# Patient Record
Sex: Female | Born: 1965
Health system: Southern US, Community
[De-identification: ages and names within clinical notes are randomized; demographics above are authoritative.]

## PROBLEM LIST (undated history)

## (undated) DIAGNOSIS — C221 Intrahepatic bile duct carcinoma: Secondary | ICD-10-CM

## (undated) DIAGNOSIS — I313 Pericardial effusion (noninflammatory): Secondary | ICD-10-CM

## (undated) DIAGNOSIS — I3139 Other pericardial effusion (noninflammatory): Secondary | ICD-10-CM

## (undated) DIAGNOSIS — D259 Leiomyoma of uterus, unspecified: Secondary | ICD-10-CM

## (undated) DIAGNOSIS — C799 Secondary malignant neoplasm of unspecified site: Secondary | ICD-10-CM

## (undated) DIAGNOSIS — C801 Malignant (primary) neoplasm, unspecified: Secondary | ICD-10-CM

## (undated) DIAGNOSIS — R000 Tachycardia, unspecified: Secondary | ICD-10-CM

## (undated) HISTORY — DX: Intrahepatic bile duct carcinoma: C22.1

## (undated) HISTORY — DX: Other pericardial effusion (noninflammatory): I31.39

## (undated) HISTORY — DX: Tachycardia, unspecified: R00.0

## (undated) HISTORY — DX: Leiomyoma of uterus, unspecified: D25.9

## (undated) HISTORY — DX: Secondary malignant neoplasm of unspecified site: C79.9

## (undated) HISTORY — DX: Pericardial effusion (noninflammatory): I31.3

## (undated) HISTORY — DX: Malignant (primary) neoplasm, unspecified: C80.1

---

## 2000-06-13 ENCOUNTER — Other Ambulatory Visit: Admission: RE | Admit: 2000-06-13 | Discharge: 2000-06-13 | Payer: Self-pay | Admitting: Obstetrics and Gynecology

## 2001-06-18 ENCOUNTER — Other Ambulatory Visit: Admission: RE | Admit: 2001-06-18 | Discharge: 2001-06-18 | Payer: Self-pay | Admitting: Obstetrics and Gynecology

## 2002-09-29 ENCOUNTER — Other Ambulatory Visit: Admission: RE | Admit: 2002-09-29 | Discharge: 2002-09-29 | Payer: Self-pay | Admitting: Obstetrics and Gynecology

## 2003-12-21 ENCOUNTER — Other Ambulatory Visit: Admission: RE | Admit: 2003-12-21 | Discharge: 2003-12-21 | Payer: Self-pay | Admitting: Obstetrics and Gynecology

## 2005-03-21 ENCOUNTER — Ambulatory Visit (HOSPITAL_COMMUNITY): Admission: RE | Admit: 2005-03-21 | Discharge: 2005-03-21 | Payer: Self-pay | Admitting: *Deleted

## 2005-09-17 ENCOUNTER — Other Ambulatory Visit: Admission: RE | Admit: 2005-09-17 | Discharge: 2005-09-17 | Payer: Self-pay | Admitting: Obstetrics & Gynecology

## 2006-05-10 ENCOUNTER — Inpatient Hospital Stay (HOSPITAL_COMMUNITY): Admission: AD | Admit: 2006-05-10 | Discharge: 2006-05-10 | Payer: Self-pay | Admitting: *Deleted

## 2006-06-13 ENCOUNTER — Inpatient Hospital Stay (HOSPITAL_COMMUNITY): Admission: AD | Admit: 2006-06-13 | Discharge: 2006-06-13 | Payer: Self-pay | Admitting: *Deleted

## 2006-07-04 ENCOUNTER — Inpatient Hospital Stay (HOSPITAL_COMMUNITY): Admission: AD | Admit: 2006-07-04 | Discharge: 2006-07-04 | Payer: Self-pay | Admitting: Obstetrics and Gynecology

## 2006-07-29 ENCOUNTER — Ambulatory Visit (HOSPITAL_COMMUNITY): Admission: RE | Admit: 2006-07-29 | Discharge: 2006-07-29 | Payer: Self-pay | Admitting: Obstetrics and Gynecology

## 2006-12-26 ENCOUNTER — Inpatient Hospital Stay (HOSPITAL_COMMUNITY): Admission: AD | Admit: 2006-12-26 | Discharge: 2006-12-26 | Payer: Self-pay | Admitting: Obstetrics & Gynecology

## 2007-01-06 ENCOUNTER — Inpatient Hospital Stay (HOSPITAL_COMMUNITY): Admission: AD | Admit: 2007-01-06 | Discharge: 2007-01-11 | Payer: Self-pay | Admitting: Obstetrics and Gynecology

## 2008-03-11 ENCOUNTER — Ambulatory Visit (HOSPITAL_COMMUNITY): Admission: RE | Admit: 2008-03-11 | Discharge: 2008-03-11 | Payer: Self-pay | Admitting: Obstetrics and Gynecology

## 2009-03-31 ENCOUNTER — Ambulatory Visit (HOSPITAL_COMMUNITY): Admission: RE | Admit: 2009-03-31 | Discharge: 2009-03-31 | Payer: Self-pay | Admitting: Obstetrics and Gynecology

## 2010-05-03 ENCOUNTER — Ambulatory Visit (HOSPITAL_COMMUNITY): Admission: RE | Admit: 2010-05-03 | Discharge: 2010-05-03 | Payer: Self-pay | Admitting: Obstetrics and Gynecology

## 2010-09-09 ENCOUNTER — Encounter: Payer: Self-pay | Admitting: Obstetrics and Gynecology

## 2011-01-01 NOTE — Op Note (Signed)
NAME:  Melissa Hurley, Melissa Hurley               ACCOUNT NO.:  [id number]   MEDICAL RECORD NO.:  [id number]          PATIENT TYPE:  INP   LOCATION:  9109                          FACILITY:  [facility]   PHYSICIAN:  Maxie Better, M.D.DATE OF BIRTH:  [birth year]   DATE OF PROCEDURE:  01/08/2007  DATE OF DISCHARGE:                               OPERATIVE REPORT   PREOPERATIVE DIAGNOSIS:  Arrest of dilatation, fibroid uterus, post  dates.   PROCEDURE:  The primary cesarean section Kerr hysterotomy.   POSTOPERATIVE DIAGNOSIS:  Arrest of dilatation and fibroid uterus, post  dates.   ANESTHESIA:  Epidural.   SURGEON:  Maxie Better, M.D.   ASSISTANT:  Naima A. Normand Sloop, M.D.   INDICATIONS:  This is a 45 year old G2 P0, married black female at 40+  weeks gestation admitted on 01/08/2007 for induction of labor secondary  to postdates. Pregnancy been complicated by large fibroid uterus. The  patient underwent Cervidil placement x2. She subsequently had  spontaneous rupture of membranes. Internal scalp electrode internal  pressure catheter was subsequently placed. The cervix at that time was  3, 40% -3. Pitocin augmentation was started.  The patient progressed to  4 cm and arrested at that dilatation and edematous cervix despite 200-  220 MV units documented by the pressure catheter. Given the cervical  exam, recommendation was made for a primary cesarean section.  During  course of her pregnancy several ultrasound suggested pedunculated  fibroids of up to 10 cm and a separate consent was therefore obtained in  the event that the fibroids were amenable for removal at a time of this  procedure. Risk of both cesarean section and removal of fibroids was  reviewed with the patient and husband.  Consent had been signed for the  labor and a separate consent was signed for the possible myomectomy.  The patient was then transferred to the operating room.   PROCEDURE:  Under adequate epidural anesthesia  the patient is placed in  the supine position with a left lateral tilt.  When adequate level was  obtained the patient was sterilely prepped and draped in usual fashion.  Indwelling Foley catheter was already in place. 10 mL of quarter percent  Marcaine was injected along the planned Pfannenstiel skin incision. A  Pfannenstiel incision was then made, carried down to the rectus fascia.  Rectus fascia was opened transversely.  Rectus fascia was then sharply  and bluntly dissected off the rectus muscles in superior and inferior  fashion.  The rectus muscle was split in midline.  The parietal  peritoneum was entered bluntly and extended. The vesicouterine  peritoneum was opened transversely.  Lower uterine segment incision was  then made and extended with bandage scissors.  Subsequent delivery of a  live female from the right occiput transverse/right occiput posterior  presentation was accomplished.  There was a compound presentation with  the baby holding the cord in the right hand. Subsequent delivery was  accomplished.  The cord was wrapped around the shoulders all of which  was released.  Cord was clamped, cut the baby was transferred to the  awaiting pediatrician  who assigned Apgars of 8 and 9 at 1 and 5.  The  placenta which was posterior was manually removed.  There was a 5 cm  intracavitary/submucosal fibroid in the left fundal region which was not  removed.  The fibroids on palpation were subserosal up to 8 cm and with  a wide base stalk, not pedunculated and therefore not removable.  The  left fallopian tubes and ovary were noted.  The right fallopian tube was  admixed with some adhesions and fibroids and therefore was not seen or  palpated due to the fibroids. Uterine incision had a small extension  inferiorly which was closed with separately with 0 Monocryl running  locked stitch.  The remaining incision was closed with 0 Monocryl  running locked stitch first layer, a second layer  was imbricated using 0  Monocryl suture.  Good hemostasis was noted.  The abdomen was then  copiously irrigated, suctioned debris.  The parietal peritoneum was not  closed.  The rectus fascia was closed with 0 Vicryl x2.  The  subcutaneous areas irrigated, small bleeders cauterized.  Interrupted 2-  0 plain sutures were then placed.  The skin approximated Ethicon  staples.   SPECIMENS:  Placenta not sent to pathology.  Estimated blood loss was  1000 mL.  Intraoperative fluid was 2500 mL crystalloid.  Urine output  was 200 mL clear yellow urine.  Sponge and instrument counts x2 was  correct.  Complications none.  The patient tolerated the procedure well  was transferred to recovery in stable condition.      Maxie Better, M.D.  Electronically Signed     [place]/MEDQ  D:  01/08/2007  T:  01/08/2007  Job:  [id number]

## 2011-01-04 NOTE — Discharge Summary (Signed)
NAME:  Melissa Hurley, Melissa Hurley               ACCOUNT NO.:  [id number]   MEDICAL RECORD NO.:  [id number]          PATIENT TYPE:  INP   LOCATION:  9109                          FACILITY:  [facility]   PHYSICIAN:  Maxie Better, M.D.DATE OF BIRTH:  02/26/1966   DATE OF ADMISSION:  01/06/2007  DATE OF DISCHARGE:  01/11/2007                               DISCHARGE SUMMARY   ADMISSION DIAGNOSES:  1. Post dates.  2. Fibroid uterus.  3. Gestational hypertension.   DISCHARGE DIAGNOSES:  1. Post dates, delivered.  2. Arrest of dilatation.  3. Fibroid uterus.  4. Iron-deficiency anemia.   PROCEDURE:  Primary cesarean section, Kerr hysterotomy.   HISTORY OF PRESENT ILLNESS:  A 45 year old, gravida 2, para 0-0-1-0  female at 40-4/7 weeks admitted for induction of labor secondary to  gestational hypertension post dates and fibroid uterus.   HOSPITAL COURSE:  The patient was admitted to [facility].  Blood  pressure was 142/94.  She was long, closed and high.  She had multiple  known large fibroids.  The patient had Cervidil placement.  PIH labs  were normal.  She had spontaneous rupture of membranes on May 21.  At  that time, she was 3, 40%, -3.  Internal scalp electrode and internal  pressure catheter were placed.  Tracing was reassuring.  Low-dose  Pitocin augmentation was subsequently started.  The patient progressed  to  4 cm, edematous cervix,  -3 station.  Despite adequate contractions  for greater than 2 hours, decision was then made to proceed with a  primary cesarean section.  The patient had a live female, normal tubes  and ovaries, and placenta was manually removed intact.  She had multiple  fibroids and the intracavitary submucosal fibroid as well.  Several  subserosal fibroids were also noted.  Please see the dictated operative  note for specific details.  The patient had estimated blood loss of 1  liter.  She had an uncomplicated postoperative course.  Her CBC on  postoperative day  #1 showed a hemoglobin of 8.5, hematocrit 26.0, white  count of 10.7, platelet count 203,000.  Her admission hemoglobin had  been 10.1.  By postoperative day #3, the patient was tolerating a  regular diet.  Incision showed no evidence of an infection.  Her blood  pressures remained stable throughout her postoperative course, and she  was deemed well to be discharged home.   DISPOSITION:  home.   CONDITION:  Stable.   DISCHARGE MEDICATIONS:  1. Tylox 1 to 2 tablets every 3 to 4 hours p.r.n. pain.  2. Ibuprofen 600 mg every 6 hours p.r.n. pain.  3. Prenatal vitamins 1 p.o. daily.  4. Continue iron supplementation twice a day.   FOLLOW-UP APPOINTMENT:  At [facility] in 6 weeks.   DISCHARGE INSTRUCTIONS:  Per the postpartum booklet given.      Maxie Better, M.D.  Electronically Signed     [place]/MEDQ  D:  02/11/2007  T:  02/11/2007  Job:  [id number]

## 2011-06-10 ENCOUNTER — Other Ambulatory Visit (HOSPITAL_COMMUNITY): Payer: Self-pay | Admitting: Obstetrics and Gynecology

## 2011-06-10 DIAGNOSIS — Z1231 Encounter for screening mammogram for malignant neoplasm of breast: Secondary | ICD-10-CM

## 2011-07-05 ENCOUNTER — Ambulatory Visit (HOSPITAL_COMMUNITY)
Admission: RE | Admit: 2011-07-05 | Discharge: 2011-07-05 | Disposition: A | Discharge status: Home / Self Care | Payer: BC Managed Care – PPO | Source: Ambulatory Visit | Attending: Obstetrics and Gynecology | Admitting: Obstetrics and Gynecology

## 2011-07-05 DIAGNOSIS — Z1231 Encounter for screening mammogram for malignant neoplasm of breast: Secondary | ICD-10-CM | POA: Insufficient documentation

## 2012-06-05 ENCOUNTER — Other Ambulatory Visit (HOSPITAL_COMMUNITY): Payer: Self-pay | Admitting: Obstetrics and Gynecology

## 2012-06-05 DIAGNOSIS — Z1231 Encounter for screening mammogram for malignant neoplasm of breast: Secondary | ICD-10-CM

## 2012-07-06 ENCOUNTER — Ambulatory Visit (HOSPITAL_COMMUNITY)
Admission: RE | Admit: 2012-07-06 | Discharge: 2012-07-06 | Disposition: A | Discharge status: Home / Self Care | Payer: BC Managed Care – PPO | Source: Ambulatory Visit | Attending: Obstetrics and Gynecology | Admitting: Obstetrics and Gynecology

## 2012-07-06 DIAGNOSIS — Z1231 Encounter for screening mammogram for malignant neoplasm of breast: Secondary | ICD-10-CM | POA: Insufficient documentation

## 2013-09-13 ENCOUNTER — Other Ambulatory Visit (HOSPITAL_COMMUNITY): Payer: Self-pay | Admitting: Obstetrics and Gynecology

## 2013-09-13 DIAGNOSIS — Z1231 Encounter for screening mammogram for malignant neoplasm of breast: Secondary | ICD-10-CM

## 2013-09-28 ENCOUNTER — Ambulatory Visit (HOSPITAL_COMMUNITY)
Admission: RE | Admit: 2013-09-28 | Discharge: 2013-09-28 | Disposition: A | Discharge status: Home / Self Care | Payer: BC Managed Care – PPO | Source: Ambulatory Visit | Attending: Obstetrics and Gynecology | Admitting: Obstetrics and Gynecology

## 2013-09-28 DIAGNOSIS — Z1231 Encounter for screening mammogram for malignant neoplasm of breast: Secondary | ICD-10-CM | POA: Insufficient documentation

## 2014-03-14 ENCOUNTER — Encounter: Payer: Self-pay | Admitting: Nurse Practitioner

## 2014-11-08 ENCOUNTER — Other Ambulatory Visit (HOSPITAL_COMMUNITY): Payer: Self-pay | Admitting: Obstetrics and Gynecology

## 2014-11-08 DIAGNOSIS — Z1231 Encounter for screening mammogram for malignant neoplasm of breast: Secondary | ICD-10-CM

## 2014-11-09 ENCOUNTER — Ambulatory Visit (HOSPITAL_COMMUNITY)
Admission: RE | Admit: 2014-11-09 | Discharge: 2014-11-09 | Disposition: A | Discharge status: Home / Self Care | Payer: BC Managed Care – PPO | Source: Ambulatory Visit | Attending: Obstetrics and Gynecology | Admitting: Obstetrics and Gynecology

## 2014-11-09 DIAGNOSIS — Z1231 Encounter for screening mammogram for malignant neoplasm of breast: Secondary | ICD-10-CM | POA: Diagnosis not present

## 2019-07-07 ENCOUNTER — Encounter: Payer: Self-pay | Admitting: Physician Assistant

## 2019-07-23 ENCOUNTER — Other Ambulatory Visit: Payer: Self-pay

## 2019-07-23 ENCOUNTER — Ambulatory Visit: Payer: BC Managed Care – PPO | Admitting: Physician Assistant

## 2019-07-23 ENCOUNTER — Encounter: Payer: Self-pay | Admitting: Physician Assistant

## 2019-07-23 ENCOUNTER — Other Ambulatory Visit (INDEPENDENT_AMBULATORY_CARE_PROVIDER_SITE_OTHER): Payer: BC Managed Care – PPO

## 2019-07-23 VITALS — BP 138/88 | HR 120 | Temp 97.8°F | Ht 66.0 in | Wt 231.0 lb

## 2019-07-23 DIAGNOSIS — R0789 Other chest pain: Secondary | ICD-10-CM

## 2019-07-23 DIAGNOSIS — R0600 Dyspnea, unspecified: Secondary | ICD-10-CM | POA: Diagnosis not present

## 2019-07-23 DIAGNOSIS — R140 Abdominal distension (gaseous): Secondary | ICD-10-CM

## 2019-07-23 DIAGNOSIS — K5900 Constipation, unspecified: Secondary | ICD-10-CM | POA: Diagnosis not present

## 2019-07-23 DIAGNOSIS — R5383 Other fatigue: Secondary | ICD-10-CM

## 2019-07-23 DIAGNOSIS — R1084 Generalized abdominal pain: Secondary | ICD-10-CM

## 2019-07-23 LAB — CBC WITH DIFFERENTIAL/PLATELET
Basophils Absolute: 0 10*3/uL (ref 0.0–0.1)
Basophils Relative: 0.4 % (ref 0.0–3.0)
Eosinophils Absolute: 0.1 10*3/uL (ref 0.0–0.7)
Eosinophils Relative: 1 % (ref 0.0–5.0)
HCT: 41.2 % (ref 36.0–46.0)
Hemoglobin: 13.1 g/dL (ref 12.0–15.0)
Lymphocytes Relative: 19.9 % (ref 12.0–46.0)
Lymphs Abs: 1.3 10*3/uL (ref 0.7–4.0)
MCHC: 31.7 g/dL (ref 30.0–36.0)
MCV: 78 fl (ref 78.0–100.0)
Monocytes Absolute: 0.5 10*3/uL (ref 0.1–1.0)
Monocytes Relative: 7.7 % (ref 3.0–12.0)
Neutro Abs: 4.7 10*3/uL (ref 1.4–7.7)
Neutrophils Relative %: 71 % (ref 43.0–77.0)
Platelets: 268 10*3/uL (ref 150.0–400.0)
RBC: 5.28 Mil/uL — ABNORMAL HIGH (ref 3.87–5.11)
RDW: 14.7 % (ref 11.5–15.5)
WBC: 6.6 10*3/uL (ref 4.0–10.5)

## 2019-07-23 LAB — TSH: TSH: 2.72 u[IU]/mL (ref 0.35–4.50)

## 2019-07-23 LAB — COMPREHENSIVE METABOLIC PANEL
ALT: 12 U/L (ref 0–35)
AST: 19 U/L (ref 0–37)
Albumin: 3.5 g/dL (ref 3.5–5.2)
Alkaline Phosphatase: 117 U/L (ref 39–117)
BUN: 14 mg/dL (ref 6–23)
CO2: 23 mEq/L (ref 19–32)
Calcium: 9.9 mg/dL (ref 8.4–10.5)
Chloride: 107 mEq/L (ref 96–112)
Creatinine, Ser: 0.92 mg/dL (ref 0.40–1.20)
GFR: 77.52 mL/min (ref >60.00)
Glucose, Bld: 110 mg/dL — ABNORMAL HIGH (ref 70–99)
Potassium: 3.7 mEq/L (ref 3.5–5.1)
Sodium: 139 mEq/L (ref 135–145)
Total Bilirubin: 0.6 mg/dL (ref 0.2–1.2)
Total Protein: 7.3 g/dL (ref 6.0–8.3)

## 2019-07-23 LAB — SEDIMENTATION RATE: Sed Rate: >100 mm/hr — ABNORMAL HIGH (ref 0–30)

## 2019-07-23 MED ORDER — OMEPRAZOLE 40 MG PO CPDR: 40.0000 mg | DELAYED_RELEASE_CAPSULE | Freq: Every day | ORAL | 6 refills | Status: DC

## 2019-07-23 NOTE — Progress Notes (Signed)
Subjective:    Patient ID: Melissa Hurley, female    DOB: 22-Dec-1965, 53 y.o.   MRN: [id number]  HPI Melissa Hurley is a pleasant 53 year old African-American female, new to GI today, self-referred with complaints of bloating and abdominal pain.  She is generally in good health with no known chronic medical problems and has not had any prior GI evaluation. She relates current symptoms started in September 2020 with new onset of bloating constipation and gassiness with some discomfort radiating into her mid back.  She is also developed a fullness or "lodging" sensation in her chest.  She denies any dysphagia or odynophagia.  She has developed new constipation and is unable to have a bowel movement without taking a laxative.  She has been using milk of magnesia which is helpful.  She has been using this a couple of times per week. She is also very concerned about significant fatigue and says she has been getting winded with activity over the past couple of months.  She says she is mostly been at home but she has to do things in "spurts" and then rest as she gets winded and fatigued.  She has not noted any melena or hematochezia. No regular aspirin or NSAID use. Family history is pertinent for breast and ovarian cancer in her mother.  Patient says she has had genetic testing done and that these were negative. No family history of colon cancer that she is aware of.  Review of Systems Pertinent positive and negative review of systems were noted in the above HPI section.  All other review of systems was otherwise negative.  Outpatient Encounter Medications as of 07/23/2019  Medication Sig  . Magnesium Hydroxide (MILK OF MAGNESIA PO) Take 1 tablet by mouth as needed.  Marland Kitchen omeprazole (PRILOSEC) 40 MG capsule Take 1 capsule (40 mg total) by mouth daily before breakfast.   No facility-administered encounter medications on file as of 07/23/2019.    No Known Allergies There are no active problems to display for  this patient.  Social History   Socioeconomic History  . Marital status: Married    Spouse name: Not on file  . Number of children: Not on file  . Years of education: Not on file  . Highest education level: Not on file  Occupational History  . Not on file  Social Needs  . Financial resource strain: Not on file  . Food insecurity    Worry: Not on file    Inability: Not on file  . Transportation needs    Medical: Not on file    Non-medical: Not on file  Tobacco Use  . Smoking status: Never Smoker  Substance and Sexual Activity  . Alcohol use: Never    Frequency: Never  . Drug use: Never  . Sexual activity: Not on file  Lifestyle  . Physical activity    Days per week: Not on file    Minutes per session: Not on file  . Stress: Not on file  Relationships  . Social Herbalist on phone: Not on file    Gets together: Not on file    Attends religious service: Not on file    Active member of club or organization: Not on file    Attends meetings of clubs or organizations: Not on file    Relationship status: Not on file  . Intimate partner violence    Fear of current or ex partner: Not on file    Emotionally abused: Not  on file    Physically abused: Not on file    Forced sexual activity: Not on file  Other Topics Concern  . Not on file  Social History Narrative  . Not on file    Ms. Cashwell's family history includes Breast cancer in her maternal grandmother and mother; Lung cancer in her mother; Ovarian cancer in her maternal aunt and mother.      Objective:    Vitals:   07/23/19 1019  BP: 138/88  Pulse: (!) 120  Temp: 97.8 F (36.6 C)    Physical Exam Well-developed well-nourished older African-American female in no acute distress.  Height, Weight, 231 BMI 37.2  HEENT; nontraumatic normocephalic, EOMI, PER R LA, sclera anicteric. Oropharynx; not examined/mask/Covid Neck; supple, no JVD Cardiovascular; tachy regular rate and rhythm with S1-S2, no  murmur rub or gallop Pulmonary; Clear bilaterally Abdomen; soft, nondistended, no palpable mass or hepatosplenomegaly, bowel sounds are active, she has some mild tenderness across the upper abdomen Rectal; not done today Skin; benign exam, no jaundice rash or appreciable lesions Extremities; no clubbing cyanosis or edema skin warm and dry, nailbeds pink Neuro/Psych; alert and oriented x4, grossly nonfocal mood and affect appropriate       Assessment & Plan:   #32 53 year old African-American female with 43-monthhistory, new onset of fatigue, some exertional dyspnea, abdominal bloating and discomfort primarily in the upper abdomen, new onset of constipation gassiness and some discomfort radiating into the back.  She is also had some sensation of fullness in the chest which she attributes to her esophagus but no dysphagia or odynophagia.  We will need further diagnostic evaluation to evaluate for etiology.  Plan; CBC with differential, c-Met, sed rate, Start omeprazole 40 mg p.o. every morning AC breakfast, prescription sent Start MiraLAX 17 g in 8 ounces of water daily. We discussed initiating work-up with endoscopic evaluation versus imaging.  She would like to pursue imaging first.  Patient will be scheduled for CT scan of the abdomen and pelvis with contrast. She may still need endoscopic evaluation depending on results.  We discussed need for colonoscopy with new constipation and age for screening purposes.   Will await labs and CT, then decide regarding colonoscopy and possible EGD. Patient will be established with Dr. JHerma MeringPA-C 07/23/2019   Cc: No ref. provider found

## 2019-07-23 NOTE — Patient Instructions (Addendum)
Your provider has requested that you go to the basement level for lab work before leaving today. Press "B" on the elevator. The lab is located at the first door on the left as you exit the elevator. ____________________________________________________________ Melissa Hurley have been scheduled for a CT scan of the abdomen and pelvis at [facility] ([address]---this is in the same building as [employer]).   You are scheduled on Friday 07/30/19 at 2:30 pm. You should arrive 15 minutes prior to your appointment time for registration. Please follow the written instructions below on the day of your exam:  WARNING: IF YOU ARE ALLERGIC TO IODINE/X-RAY DYE, PLEASE NOTIFY RADIOLOGY IMMEDIATELY AT [phone]! YOU WILL BE GIVEN A 13 HOUR PREMEDICATION PREP.  1) Do not eat or drink anything after 10:30 am (4 hours prior to your test) 2) You have been given 2 bottles of oral contrast to drink. The solution may taste better if refrigerated, but do NOT add ice or any other liquid to this solution. Shake well before drinking.    Drink 1 bottle of contrast @ 12:30 pm (2 hours prior to your exam)  Drink 1 bottle of contrast @ 1:30 pm (1 hour prior to your exam)  You may take any medications as prescribed with a small amount of water, if necessary. If you take any of the following medications: METFORMIN, GLUCOPHAGE, GLUCOVANCE, AVANDAMET, RIOMET, FORTAMET, [place] MET, JANUMET, GLUMETZA or METAGLIP, you MAY be asked to HOLD this medication 48 hours AFTER the exam.  The purpose of you drinking the oral contrast is to aid in the visualization of your intestinal tract. The contrast solution may cause some diarrhea. Depending on your individual set of symptoms, you may also receive an intravenous injection of x-ray contrast/dye. Plan on being at [facility] for 30 minutes or longer, depending on the type of exam you are having performed.  This test typically takes 30-45 minutes to  complete.  If you have any questions regarding your exam or if you need to reschedule, you may call the CT department at [phone] between the hours of 8:00 am and 5:00 pm, Monday-Friday.  ________________________________________________________________ If you are age 52 or older, your body mass index should be between 23-30. Your Body mass index is 37.28 kg/m. If this is out of the aforementioned range listed, please consider follow up with your Primary Care Provider.  If you are age 5 or younger, your body mass index should be between 19-25. Your Body mass index is 37.28 kg/m. If this is out of the aformentioned range listed, please consider follow up with your Primary Care Provider.  ________________________________________________________________ We have sent the following medications to your pharmacy for you to pick up at your convenience: Omeprazole 40 mg daily before breakfast  Please purchase the following medications over the counter and take as directed: Miralax 17 grams in 8 ounces water daily for bowels

## 2019-07-26 NOTE — Progress Notes (Signed)
I agree with the above note, plan 

## 2019-07-27 ENCOUNTER — Other Ambulatory Visit: Payer: Self-pay

## 2019-07-27 ENCOUNTER — Ambulatory Visit (INDEPENDENT_AMBULATORY_CARE_PROVIDER_SITE_OTHER)
Admission: RE | Admit: 2019-07-27 | Discharge: 2019-07-27 | Disposition: A | Discharge status: Home / Self Care | Payer: BC Managed Care – PPO | Source: Ambulatory Visit | Attending: Physician Assistant | Admitting: Physician Assistant

## 2019-07-27 ENCOUNTER — Other Ambulatory Visit: Payer: BC Managed Care – PPO

## 2019-07-27 DIAGNOSIS — R5383 Other fatigue: Secondary | ICD-10-CM

## 2019-07-27 DIAGNOSIS — R0600 Dyspnea, unspecified: Secondary | ICD-10-CM

## 2019-07-27 DIAGNOSIS — R1084 Generalized abdominal pain: Secondary | ICD-10-CM | POA: Diagnosis not present

## 2019-07-29 LAB — ANGIOTENSIN CONVERTING ENZYME: Angiotensin-Converting Enzyme: 26 U/L (ref 9–67)

## 2019-07-29 LAB — ANA: Anti Nuclear Antibody (ANA): NEGATIVE

## 2019-07-29 NOTE — Progress Notes (Signed)
Please let pt know ANA and ACe level  negative   CXR  shows probable atelectasis at bases of lungs and cardiomegaly   Find out if she has a primary MD - will need her to be seen by primary MD

## 2019-07-30 ENCOUNTER — Ambulatory Visit (INDEPENDENT_AMBULATORY_CARE_PROVIDER_SITE_OTHER)
Admission: RE | Admit: 2019-07-30 | Discharge: 2019-07-30 | Disposition: A | Discharge status: Home / Self Care | Payer: BC Managed Care – PPO | Source: Ambulatory Visit | Attending: Physician Assistant | Admitting: Physician Assistant

## 2019-07-30 ENCOUNTER — Telehealth: Payer: Self-pay

## 2019-07-30 ENCOUNTER — Other Ambulatory Visit: Payer: Self-pay

## 2019-07-30 DIAGNOSIS — R140 Abdominal distension (gaseous): Secondary | ICD-10-CM

## 2019-07-30 DIAGNOSIS — R0600 Dyspnea, unspecified: Secondary | ICD-10-CM

## 2019-07-30 DIAGNOSIS — K5900 Constipation, unspecified: Secondary | ICD-10-CM | POA: Diagnosis not present

## 2019-07-30 DIAGNOSIS — R0789 Other chest pain: Secondary | ICD-10-CM

## 2019-07-30 DIAGNOSIS — R1084 Generalized abdominal pain: Secondary | ICD-10-CM

## 2019-07-30 DIAGNOSIS — R5383 Other fatigue: Secondary | ICD-10-CM

## 2019-07-30 MED ORDER — IOHEXOL 300 MG/ML  SOLN
100.0000 mL | Freq: Once | INTRAMUSCULAR | Status: AC | PRN
  Administered 2019-07-30: 100 mL via INTRAVENOUS

## 2019-07-30 NOTE — Telephone Encounter (Signed)
Call report from radiology about the CT scan abd/pelvis. Please review in [name]'s absence.

## 2019-07-30 NOTE — Telephone Encounter (Signed)
[name] is hospital on Monday. This scheduling will be precarious and definitley hinge on the patient being aware of what is going on,  But I have put the her on the schedule as follows 1) She will need to go for the COVID test        on 08/03/19 at 9:30 am. 2) Pre-Visit 08/03/19 at 10:30 am. [name] is aware of this) 3) Her procedure is booked for 08/04/19 at     4:30 pm. (I have left a message for [employer])

## 2019-07-30 NOTE — Telephone Encounter (Signed)
OK, thanks.  I called her just now but there was no answer and I did not leave a VM.  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 I am happy to talk with her about it or AE if she is back Monday.    She needs colonoscopy and EGD.  I can do it on Wednesday the 16th after my currently scheduled cases. Only one spot is available. Please talk with [place] about doing a double anyway. Best to take the spot now so it isn't taken before long.  At the time of the colonsocopy, EGD I will discuss more about this with her.  From that appointment will plan on referring to med onc.  Thanks

## 2019-08-02 ENCOUNTER — Telehealth: Payer: Self-pay | Admitting: Hematology and Oncology

## 2019-08-02 ENCOUNTER — Ambulatory Visit (INDEPENDENT_AMBULATORY_CARE_PROVIDER_SITE_OTHER)
Admission: RE | Admit: 2019-08-02 | Discharge: 2019-08-02 | Disposition: A | Discharge status: Home / Self Care | Payer: BC Managed Care – PPO | Source: Ambulatory Visit | Attending: Gastroenterology | Admitting: Gastroenterology

## 2019-08-02 ENCOUNTER — Other Ambulatory Visit: Payer: Self-pay

## 2019-08-02 ENCOUNTER — Telehealth: Payer: Self-pay

## 2019-08-02 ENCOUNTER — Other Ambulatory Visit: Payer: Self-pay | Admitting: Gastroenterology

## 2019-08-02 ENCOUNTER — Telehealth: Payer: Self-pay | Admitting: Gastroenterology

## 2019-08-02 ENCOUNTER — Encounter: Payer: Self-pay | Admitting: Gastroenterology

## 2019-08-02 DIAGNOSIS — R0600 Dyspnea, unspecified: Secondary | ICD-10-CM | POA: Diagnosis not present

## 2019-08-02 DIAGNOSIS — C7800 Secondary malignant neoplasm of unspecified lung: Secondary | ICD-10-CM

## 2019-08-02 DIAGNOSIS — C159 Malignant neoplasm of esophagus, unspecified: Secondary | ICD-10-CM

## 2019-08-02 DIAGNOSIS — K5900 Constipation, unspecified: Secondary | ICD-10-CM

## 2019-08-02 MED ORDER — IOHEXOL 300 MG/ML  SOLN
80.0000 mL | Freq: Once | INTRAMUSCULAR | Status: AC | PRN
  Administered 2019-08-02: 14:00:00 80 mL via INTRAVENOUS

## 2019-08-02 NOTE — Telephone Encounter (Signed)
Yes, we spoke about 2 hours ago

## 2019-08-02 NOTE — Telephone Encounter (Signed)
Dr. Owens Loffler called regarding advice on patient. She has a pericardial effusion and is concerned that it is malignant. Patient is having a colonoscopy/endoscopy in 2 day and would like recommendations. Forwarding to DOD, Dr. Radford Pax to call back.

## 2019-08-02 NOTE — Telephone Encounter (Signed)
Per Dr. Radford Pax, patient needs a STAT echocardiogram tomorrow and an office visit with DOD, Dr. Curt Bears. Both appointments have been made and patient is aware.

## 2019-08-02 NOTE — Telephone Encounter (Signed)
Dan,  Yes, we will, I will send you a separate in basket message about this.   Truitt Merle MD

## 2019-08-02 NOTE — Telephone Encounter (Signed)
Dr Jacobs did you call the pt? 

## 2019-08-02 NOTE — Telephone Encounter (Signed)
Pt left a message with the answering service stating that she was returning Dr. Ardis Hughs' call about CT results.

## 2019-08-02 NOTE — Telephone Encounter (Signed)
I left a message on her voicemail this morning at 730.  I asked her to call back to the office sometime this morning to discuss the CT report.  I will try again later this morning.  There is only one phone number available to contact her through epic.

## 2019-08-02 NOTE — Telephone Encounter (Signed)
I called back this morning and spoke with her on the phone.  She understands the CAT scan results.    Beth, Please contact her this morning about the logistics of her colonoscopy and upper endoscopy Wednesday afternoon with preprocedure Covid testing.  Please also order CT scan of the chest with IV contrast as follow-up of the CT scan of the abdomen.       Revonda Standard: See her CT report.  She has diffuse tumors throughout her liver, bases of her lungs, adenopathy in the abdomen.  I am planning an expedited colonoscopy and EGD on Wednesday afternoon.  I am ordering a chest CT scan as well.  It would be great if you guys can look for an expedited medical oncology appointment for her.  She does not know that I have involved you yet (just seemed too much for her to hear this morning)  but if you can open up the space or pencil her in and we can send an official consult Wednesday afternoon following her colonoscopy and EGD.  Thank you

## 2019-08-02 NOTE — Telephone Encounter (Signed)
Received a staff msg from Dr. Burr Medico to schedule [name] for suspicious of metastatic disease. [name] has been scheduled to see Dr. Lorenso Courier on 12/18 at 1pm. I cld and lft the appt date and time on the pt's vm. I asked that she call back to confirm the appt date and time.

## 2019-08-02 NOTE — Telephone Encounter (Signed)
Chest Ct with contrast arranged for today at 2:00pm Patient is aware of the appointments arranged for her.   Today 08/02/19 9:30 COVID screen test 10:30 Pre-visit 2:00 Chest CT  08/04/19 3:30 Colon/EGD   I had previously arranged a new patient appointment for her to establish with a PCP this Friday 08/06/19. Will it be beneficial for her to keep this appointment?

## 2019-08-02 NOTE — Telephone Encounter (Signed)
Thank you for all the help expediting care for this pt

## 2019-08-02 NOTE — Telephone Encounter (Signed)
Ms. Mccullick returned my call and has confirmed her appt w/Dr. Lorenso Courier on 12/18 at 1pm. She's been made aware to arrive 15 minutes early

## 2019-08-03 ENCOUNTER — Encounter: Payer: Self-pay | Admitting: Cardiology

## 2019-08-03 ENCOUNTER — Other Ambulatory Visit (HOSPITAL_COMMUNITY): Payer: Self-pay | Admitting: Cardiology

## 2019-08-03 ENCOUNTER — Ambulatory Visit (HOSPITAL_COMMUNITY): Payer: BC Managed Care – PPO | Attending: Cardiovascular Disease

## 2019-08-03 ENCOUNTER — Ambulatory Visit: Payer: BC Managed Care – PPO | Admitting: Cardiology

## 2019-08-03 ENCOUNTER — Ambulatory Visit (INDEPENDENT_AMBULATORY_CARE_PROVIDER_SITE_OTHER): Payer: BC Managed Care – PPO

## 2019-08-03 ENCOUNTER — Telehealth: Payer: Self-pay | Admitting: *Deleted

## 2019-08-03 VITALS — BP 120/88 | HR 109 | Ht 67.0 in | Wt 229.0 lb

## 2019-08-03 DIAGNOSIS — I313 Pericardial effusion (noninflammatory): Secondary | ICD-10-CM

## 2019-08-03 DIAGNOSIS — I3139 Other pericardial effusion (noninflammatory): Secondary | ICD-10-CM

## 2019-08-03 DIAGNOSIS — Z1159 Encounter for screening for other viral diseases: Secondary | ICD-10-CM

## 2019-08-03 LAB — ECHOCARDIOGRAM COMPLETE
Height: 67 in
Weight: 3664 oz

## 2019-08-03 LAB — SARS CORONAVIRUS 2 (TAT 6-24 HRS): SARS Coronavirus 2: NEGATIVE

## 2019-08-03 NOTE — Telephone Encounter (Signed)
Left detailed message informing pt to start her prep tonight for her colonoscopy tomorrow - per Dr Curt Bears. Also left message informing her office would call to arrange 1 month APP appt.

## 2019-08-03 NOTE — Progress Notes (Signed)
Electrophysiology Office Note   Date:  08/03/2019   ID:  Melissa Hurley, DOB 1965-11-05, MRN [id number]  PCP:  Patient, No Pcp Per  Cardiologist:   Primary Electrophysiologist:  Besan Ketchem Meredith Leeds, MD    Chief Complaint: effusion   History of Present Illness: Melissa Hurley is a 53 y.o. female who is being seen today for the evaluation of pericardial effusion at the request of Owens Loffler. Presenting today for electrophysiology evaluation.  Patient initially presented to GI clinic with abdominal fullness, discomfort, exertional dyspnea, and fatigue.  She also had new onset of constipation and gassiness.  She had a CT scan that showed multiple liver masses but also a moderate sized pericardial effusion.  She was referred to cardiology clinic for evaluation of her pericardial effusion.  Today, she denies symptoms of palpitations, chest pain, orthopnea, PND, lower extremity edema, claudication, dizziness, presyncope, syncope, bleeding, or neurologic sequela. The patient is tolerating medications without difficulties.  Her main complaints are as above.  She also has exertional shortness of breath.   History reviewed. No pertinent past medical history. Past Surgical History:  Procedure Laterality Date  . CESAREAN SECTION       Current Outpatient Medications  Medication Sig Dispense Refill  . omeprazole (PRILOSEC) 40 MG capsule Take 1 capsule (40 mg total) by mouth daily before breakfast. 30 capsule 6   No current facility-administered medications for this visit.    Allergies:   Patient has no known allergies.   Social History:  The patient  reports that she has never smoked. She has never used smokeless tobacco. She reports that she does not drink alcohol or use drugs.   Family History:  The patient's family history includes Breast cancer in her maternal grandmother and mother; Lung cancer in her mother; Ovarian cancer in her maternal aunt and mother.    ROS:  Please see  the history of present illness.   Otherwise, review of systems is positive for none.   All other systems are reviewed and negative.    PHYSICAL EXAM: VS:  BP 120/88   Pulse (!) 109   Ht 5\' 7"  (1.702 m)   Wt 229 lb (103.9 kg)   SpO2 100%   BMI 35.87 kg/m  , BMI Body mass index is 35.87 kg/m. GEN: Well nourished, well developed, in no acute distress  HEENT: normal  Neck: no JVD, carotid bruits, or masses Cardiac: Tachycardic, regular; no murmurs, rubs, or gallops,no edema  Respiratory:  clear to auscultation bilaterally, normal work of breathing GI: soft, nontender, nondistended, + BS MS: no deformity or atrophy  Skin: warm and dry Neuro:  Strength and sensation are intact Psych: euthymic mood, full affect  EKG:  EKG is ordered today. Personal review of the ekg ordered shows sinus rhythm, rate 109  Recent Labs: 07/23/2019: ALT 12; BUN 14; Creatinine, Ser 0.92; Hemoglobin 13.1; Platelets 268.0; Potassium 3.7; Sodium 139; TSH 2.72    Lipid Panel  No results found for: CHOL, TRIG, HDL, CHOLHDL, VLDL, LDLCALC, LDLDIRECT   Wt Readings from Last 3 Encounters:  08/03/19 229 lb (103.9 kg)  07/23/19 231 lb (104.8 kg)      Other studies Reviewed: Additional studies/ records that were reviewed today include: TTE 08/03/19  Review of the above records today demonstrates:  1. There is a moderate circumferential pericardial effusion with prominent pericardial fat pad. There is normal RV function. There is no evidence of RV diastolic collapse or variation of TV/MV inflow to suggest tamponade.  The IVC is normal size and  collapsable on the images provided. There is minimal movement of the RA wall in late systole, but this is a non-specific finding. Overall, there are no echocardiographic findings to suggest cardiac tamponade.  2. Left ventricular ejection fraction, by visual estimation, is 55 to 60%. The left ventricle has normal function. There is no left ventricular hypertrophy.  3.  Indeterminate diastolic filling due to E-A fusion.  4. The left ventricle has no regional wall motion abnormalities.  5. Global right ventricle has normal systolic function.The right ventricular size is normal. No increase in right ventricular wall thickness.  6. Left atrial size was normal.  7. Right atrial size was normal.  8. Moderate pericardial effusion.  9. Presence of pericardial fat pad. 10. The pericardial effusion is circumferential. 11. Mild mitral annular calcification. 12. The mitral valve is degenerative. No evidence of mitral valve regurgitation. 13. The tricuspid valve is grossly normal. Tricuspid valve regurgitation is trivial. 14. The aortic valve is tricuspid. Aortic valve regurgitation is not visualized. No evidence of aortic valve sclerosis or stenosis. 15. The pulmonic valve was grossly normal. Pulmonic valve regurgitation is not visualized. 16. No prior Echocardiogram.   ASSESSMENT AND PLAN:  1.  Pericardial effusion: Found on the CT scan.  She does have multiple masses in her liver and was found to have effusion on her CT scan.  Echocardiogram shows no evidence of tamponade but a moderate pericardial effusion.  At this point, this would not preclude her from having further evaluation with either endoscopy or colonoscopy.  She would be at intermediate risk for this low risk procedure.  There is certainly concern that this is a malignant effusion.  I Phil Corti discuss this with her primary gastroenterologist.    Current medicines are reviewed at length with the patient today.   The patient does not have concerns regarding her medicines.  The following changes were made today:  none  Labs/ tests ordered today include:  Orders Placed This Encounter  Procedures  . EKG 12-Lead     Disposition:   [facility] 1 month  Signed, Casimiro Lienhard Meredith Leeds, MD  08/03/2019 1:31 PM     [address] [place] [address] [phone]  (office) [phone] (fax)

## 2019-08-04 ENCOUNTER — Other Ambulatory Visit: Payer: Self-pay

## 2019-08-04 ENCOUNTER — Encounter: Payer: Self-pay | Admitting: Gastroenterology

## 2019-08-04 ENCOUNTER — Ambulatory Visit (AMBULATORY_SURGERY_CENTER): Payer: BC Managed Care – PPO | Admitting: Gastroenterology

## 2019-08-04 ENCOUNTER — Telehealth: Payer: Self-pay | Admitting: Gastroenterology

## 2019-08-04 VITALS — BP 126/75 | HR 99 | Temp 98.4°F | Resp 21 | Ht 66.0 in | Wt 231.0 lb

## 2019-08-04 DIAGNOSIS — K208 Other esophagitis without bleeding: Secondary | ICD-10-CM

## 2019-08-04 DIAGNOSIS — R933 Abnormal findings on diagnostic imaging of other parts of digestive tract: Secondary | ICD-10-CM

## 2019-08-04 DIAGNOSIS — R112 Nausea with vomiting, unspecified: Secondary | ICD-10-CM

## 2019-08-04 DIAGNOSIS — R1084 Generalized abdominal pain: Secondary | ICD-10-CM

## 2019-08-04 MED ORDER — SODIUM CHLORIDE 0.9 % IV SOLN: 4.0000 mg | Freq: Once | INTRAVENOUS | Status: DC

## 2019-08-04 MED ORDER — SODIUM CHLORIDE 0.9 % IV SOLN: 500.0000 mL | Freq: Once | INTRAVENOUS | Status: DC

## 2019-08-04 MED ORDER — FLUCONAZOLE 100 MG PO TABS: 100.0000 mg | ORAL_TABLET | Freq: Every day | ORAL | 0 refills | Status: DC

## 2019-08-04 NOTE — Op Note (Signed)
[facility] Patient Name: Melissa Hurley Procedure Date: 08/04/2019 3:54 PM MRN: [id number] Endoscopist: Milus Banister , MD Age: 53 Referring MD:  Date of Birth: 29-Apr-1966 Gender: Female Account #: [id number] Procedure:                Upper GI endoscopy Indications:              abdominal pain led to CT scans that showed multiple                            masses in the liver, somewhat abnormal proximal                            stomach Medicines:                Monitored Anesthesia Care Procedure:                Pre-Anesthesia Assessment:                           - Prior to the procedure, a History and Physical                            was performed, and patient medications and                            allergies were reviewed. The patient's tolerance of                            previous anesthesia was also reviewed. The risks                            and benefits of the procedure and the sedation                            options and risks were discussed with the patient.                            All questions were answered, and informed consent                            was obtained. Prior Anticoagulants: The patient has                            taken no previous anticoagulant or antiplatelet                            agents. ASA Grade Assessment: II - A patient with                            mild systemic disease. After reviewing the risks                            and benefits, the patient was deemed in  satisfactory condition to undergo the procedure.                           After obtaining informed consent, the endoscope was                            passed under direct vision. Throughout the                            procedure, the patient's blood pressure, pulse, and                            oxygen saturations were monitored continuously. The                            Endoscope was introduced through the mouth,  and                            advanced to the second part of duodenum. The upper                            GI endoscopy was accomplished without difficulty.                            The patient tolerated the procedure well. Scope In: Scope Out: Findings:                 White, yellow nummular lesions were noted in the                            entire esophagus consistent with candida infection                            of the esophagus                           A small hiatal hernia was present.                           The exam was otherwise without abnormality. Complications:            No immediate complications. Estimated blood loss:                            None. Estimated Blood Loss:     Estimated blood loss: none. Impression:               - Candida esophagitis.                           - Small hiatal hernia.                           - The examination was otherwise normal. Recommendation:           - Patient has a contact number available for  emergencies. The signs and symptoms of potential                            delayed complications were discussed with the                            patient. Return to normal activities tomorrow.                            Written discharge instructions were provided to the                            patient.                           - Resume previous diet.                           - Continue present medications. New prescription                            written today for diflucan 100mg  pills, one pill                            once daily, disp 10 pills, no refills.                           - My office will arrange IR guided biopsy of one of                            the liver tumors.                           - Please continue with plans to meet medical                            oncologist on Friday. Milus Banister, MD 08/04/2019 4:34:50 PM This report has been signed electronically.

## 2019-08-04 NOTE — Op Note (Signed)
[facility] Patient Name: Melissa Hurley Procedure Date: 08/04/2019 3:57 PM MRN: [id number] Endoscopist: Milus Banister , MD Age: 53 Referring MD:  Date of Birth: [birth year] Gender: Female Account #: [id number] Procedure:                Colonoscopy Indications:              abdominal pain led to CT scans that showed multiple                            tumors in the liver, unclear primary source Medicines:                Monitored Anesthesia Care Procedure:                Pre-Anesthesia Assessment:                           - Prior to the procedure, a History and Physical                            was performed, and patient medications and                            allergies were reviewed. The patient's tolerance of                            previous anesthesia was also reviewed. The risks                            and benefits of the procedure and the sedation                            options and risks were discussed with the patient.                            All questions were answered, and informed consent                            was obtained. Prior Anticoagulants: The patient has                            taken no previous anticoagulant or antiplatelet                            agents. ASA Grade Assessment: II - A patient with                            mild systemic disease. After reviewing the risks                            and benefits, the patient was deemed in                            satisfactory condition to undergo the procedure.  After obtaining informed consent, the colonoscope                            was passed under direct vision. Throughout the                            procedure, the patient's blood pressure, pulse, and                            oxygen saturations were monitored continuously. The                            Colonoscope was introduced through the anus and                            advanced to  the the cecum, identified by                            appendiceal orifice and ileocecal valve. The                            colonoscopy was performed without difficulty. The                            patient tolerated the procedure well. The quality                            of the bowel preparation was good. Scope In: 4:16:29 PM Scope Out: 4:24:12 PM Scope Withdrawal Time: 0 hours 5 minutes 14 seconds  Total Procedure Duration: 0 hours 7 minutes 43 seconds  Findings:                 The entire examined colon appeared normal on direct                            and retroflexion views. Complications:            No immediate complications. Estimated blood loss:                            None. Estimated Blood Loss:     Estimated blood loss: none. Impression:               - The entire examined colon is normal on direct and                            retroflexion views.                           - No polyps or cancers. Recommendation:           - EGD now. Milus Banister, MD 08/04/2019 4:26:12 PM This report has been signed electronically.

## 2019-08-04 NOTE — Telephone Encounter (Signed)
Pt is scheduled for an EGD/col today at 4:30 and stated that she has vomited her prep soln.

## 2019-08-04 NOTE — Telephone Encounter (Signed)
Called pt regarding vomiting her Suprep.  She states that she drank a large amount of prep last night, then vomited.  States that she had liquid stools afterward that were yellowish.  Advised to increase fluid intake this am, then drink second dose of prep slowly and maybe alternate with warm broth to eradicate taste.  Also advised to contact us if she has prep issues again.  Advised that stool should look watery/clear yellow when complete.  She agreed to all.

## 2019-08-04 NOTE — Progress Notes (Signed)
A/ox3, pleased with MAC, report to RN 

## 2019-08-04 NOTE — Patient Instructions (Signed)
YOU HAD AN ENDOSCOPIC PROCEDURE TODAY AT THE [facility]:   Refer to the procedure report that was given to you for any specific questions about what was found during the examination.  If the procedure report does not answer your questions, please call your gastroenterologist to clarify.  If you requested that your care partner not be given the details of your procedure findings, then the procedure report has been included in a sealed envelope for you to review at your convenience later.  YOU SHOULD EXPECT: Some feelings of bloating in the abdomen. Passage of more gas than usual.  Walking can help get rid of the air that was put into your GI tract during the procedure and reduce the bloating. If you had a lower endoscopy (such as a colonoscopy or flexible sigmoidoscopy) you may notice spotting of blood in your stool or on the toilet paper. If you underwent a bowel prep for your procedure, you may not have a normal bowel movement for a few days.  Please Note:  You might notice some irritation and congestion in your nose or some drainage.  This is from the oxygen used during your procedure.  There is no need for concern and it should clear up in a day or so.  SYMPTOMS TO REPORT IMMEDIATELY:   Following lower endoscopy (colonoscopy or flexible sigmoidoscopy):  Excessive amounts of blood in the stool  Significant tenderness or worsening of abdominal pains  Swelling of the abdomen that is new, acute  Fever of 100F or higher   Following upper endoscopy (EGD)  Vomiting of blood or coffee ground material  New chest pain or pain under the shoulder blades  Painful or persistently difficult swallowing  New shortness of breath  Fever of 100F or higher  Black, tarry-looking stools  For urgent or emergent issues, a gastroenterologist can be reached at any hour by calling [phone].   DIET:  We do recommend a small meal at first, but then you may proceed to your regular diet.  Drink  plenty of fluids but you should avoid alcoholic beverages for 24 hours.  ACTIVITY:  You should plan to take it easy for the rest of today and you should NOT DRIVE or use heavy machinery until tomorrow (because of the sedation medicines used during the test).    FOLLOW UP: Our staff will call the number listed on your records 48-72 hours following your procedure to check on you and address any questions or concerns that you may have regarding the information given to you following your procedure. If we do not reach you, we will leave a message.  We will attempt to reach you two times.  During this call, we will ask if you have developed any symptoms of COVID 19. If you develop any symptoms (ie: fever, flu-like symptoms, shortness of breath, cough etc.) before then, please call [phone].  If you test positive for Covid 19 in the 2 weeks post procedure, please call and report this information to us.    If any biopsies were taken you will be contacted by phone or by letter within the next 1-3 weeks.  Please call us at [phone] if you have not heard about the biopsies in 3 weeks.    SIGNATURES/CONFIDENTIALITY: You and/or your care partner have signed paperwork which will be entered into your electronic medical record.  These signatures attest to the fact that that the information above on your After Visit Summary has been reviewed and is   understood.  Full responsibility of the confidentiality of this discharge information lies with you and/or your care-partner. 

## 2019-08-04 NOTE — Progress Notes (Signed)
IR guided biopsy needs be arranged asap.

## 2019-08-04 NOTE — Progress Notes (Signed)
Temp JR  VS DT  Pt's states no medical or surgical changes since previsit or office visit. Admitting RN reviewed.  Patient states that she has fluid around her heart.  Doctors are aware.

## 2019-08-05 ENCOUNTER — Encounter (HOSPITAL_COMMUNITY): Payer: Self-pay | Admitting: Radiology

## 2019-08-05 ENCOUNTER — Telehealth: Payer: Self-pay

## 2019-08-05 ENCOUNTER — Other Ambulatory Visit: Payer: Self-pay

## 2019-08-05 DIAGNOSIS — D490 Neoplasm of unspecified behavior of digestive system: Secondary | ICD-10-CM

## 2019-08-05 NOTE — Progress Notes (Signed)
[place] [phone]   [phone]  INITIAL CONSULT NOTE  Patient Care Team: Patient, No Pcp Per as PCP - General (General Practice)  Hematological/Oncological History  # Metastatic Disease of Unknown Primary, Workup Underway 1) 07/30/2019: CT scan performed for generalized abdominal pain and bloating. Findings showed multiple hypervascular masses, abdominal lymphadenopathy, lung nodules, and thickening of the GE junction.  2) 08/04/2019: EGD showed candida infection of the esophagus, small hiatal hernia, with no other concerning findings. Colonoscopy performed with no malignancy or abnormalities noted. 2) 08/06/2019: establish care with Dr. Lorenso Courier   CHIEF COMPLAINTS/PURPOSE OF CONSULTATION:  New metastatic disease  HISTORY OF PRESENTING ILLNESS:  Melissa Hurley 53 y.o. female with no remarkable past medical history who presents for evaluation of a newly diagnosed metastatic disease, currently undergoing diagnostic evaluation.   On review of prior records Melissa Hurley presented to low-power gastroenterology on 07/23/2019 due to a 10-monthhistory of fatigue exertional dyspnea and abdominal bloating.  As part of their evaluation they recommended a CT scan of the abdomen and pelvis.  The scan was performed on 07/30/2019 and showed multiple hypervascular masses particular in the liver with abdominal lymphadenopathy lung nodules and thickening of the GE junction.  An EGD was performed on 04 August 2019 which showed Candida infection of the esophagus, but no clear signs of bleeding C.  Colonoscopy performed on the same date similarly showed no clear etiology.  Due to concern for new metastatic disease she was referred to oncology for further evaluation and management.  On exam today Melissa Hurley appears anxious.  Reports that her symptoms began in mid September with some bloating and gas as well as feeling winded on minimal exertion she called her GI when she was  experiencing the symptoms and a CT scan was performed.  She notes that she underwent testing this summer for genetic abnormalities linked a cancer given the strong history of breast and ovarian cancer in her family.  She reports that her maternal grandmother, mother, aunt, and first cousin all had breast cancer, with her aunt and mother both having ovarian cancer as well.  She has a sister who had a suspicious breast lesion, but that turned out to be benign.  On further discussion Ms. SMaturinhas no clear risk factors for cancer.  She is a never smoker and does not drink alcohol.  Her occupation is out of school teacher and she has no high risk behaviors, and no blood transfusions which could possibly contaminated her with hepatitis B or hepatitis C.  She is overweight, but does not have type 2 diabetes.  She is undergone EGD and colonoscopy, neither which reveal a clear etiology for her current findings.  She has not lost any weight or had swings in her weight but she does endorse having discomfort on her right side when she lies on that night.  A full 10 point ROS is listed below.  MEDICAL HISTORY:  Past Medical History:  Diagnosis Date  . Cancer ([facility]    Liver    SURGICAL HISTORY: Past Surgical History:  Procedure Laterality Date  . CESAREAN SECTION      SOCIAL HISTORY: Social History   Socioeconomic History  . Marital status: Married    Spouse name: Not on file  . Number of children: Not on file  . Years of education: Not on file  . Highest education level: Not on file  Occupational History  . Not on file  Tobacco Use  .  Smoking status: Never Smoker  . Smokeless tobacco: Never Used  Substance and Sexual Activity  . Alcohol use: Never  . Drug use: Never  . Sexual activity: Not on file  Other Topics Concern  . Not on file  Social History Narrative  . Not on file   Social Determinants of Health   Financial Resource Strain:   . Difficulty of Paying Living Expenses: Not on  file  Food Insecurity:   . Worried About Charity fundraiser in the Last Year: Not on file  . Ran Out of Food in the Last Year: Not on file  Transportation Needs:   . Lack of Transportation (Medical): Not on file  . Lack of Transportation (Non-Medical): Not on file  Physical Activity:   . Days of Exercise per Week: Not on file  . Minutes of Exercise per Session: Not on file  Stress:   . Feeling of Stress : Not on file  Social Connections:   . Frequency of Communication with Friends and Family: Not on file  . Frequency of Social Gatherings with Friends and Family: Not on file  . Attends Religious Services: Not on file  . Active Member of Clubs or Organizations: Not on file  . Attends Archivist Meetings: Not on file  . Marital Status: Not on file  Intimate Partner Violence:   . Fear of Current or Ex-Partner: Not on file  . Emotionally Abused: Not on file  . Physically Abused: Not on file  . Sexually Abused: Not on file    FAMILY HISTORY: Family History  Problem Relation Age of Onset  . Breast cancer Mother        46's  . Ovarian cancer Mother   . Lung cancer Mother   . Ovarian cancer Maternal Aunt   . Breast cancer Maternal Grandmother   . Colon cancer Neg Hx   . Esophageal cancer Neg Hx   . Rectal cancer Neg Hx   . Stomach cancer Neg Hx     ALLERGIES:  has No Known Allergies.  MEDICATIONS:  Current Outpatient Medications  Medication Sig Dispense Refill  . fluconazole (DIFLUCAN) 100 MG tablet Take 1 tablet (100 mg total) by mouth daily. 10 tablet 0  . omeprazole (PRILOSEC) 40 MG capsule Take 1 capsule (40 mg total) by mouth daily before breakfast. 30 capsule 6   Current Facility-Administered Medications  Medication Dose Route Frequency Provider Last Rate Last Admin  . ondansetron (ZOFRAN) 4 mg in sodium chloride 0.9 % 50 mL IVPB  4 mg Intravenous Once Milus Banister, MD        REVIEW OF SYSTEMS:   Constitutional: ( - ) fevers, ( - )  chills , ( - )  night sweats Eyes: ( - ) blurriness of vision, ( - ) double vision, ( - ) watery eyes Ears, nose, mouth, throat, and face: ( - ) mucositis, ( - ) sore throat Respiratory: ( - ) cough, ( - ) dyspnea, ( - ) wheezes Cardiovascular: ( - ) palpitation, ( - ) chest discomfort, ( - ) lower extremity swelling Gastrointestinal:  ( - ) nausea, ( - ) heartburn, ( + ) bloating Skin: ( - ) abnormal skin rashes Lymphatics: ( - ) new lymphadenopathy, ( - ) easy bruising Neurological: ( - ) numbness, ( - ) tingling, ( - ) new weaknesses Behavioral/Psych: ( - ) mood change, ( - ) new changes  All other systems were reviewed with the patient and are  negative.  PHYSICAL EXAMINATION: ECOG PERFORMANCE STATUS: 0 - Asymptomatic  Vitals:   08/06/19 1323  BP: 138/72  Pulse: 98  Resp: 17  Temp: 98.3 F (36.8 C)  SpO2: 100%   Filed Weights   08/06/19 1323  Weight: 228 lb 11.2 oz (103.7 kg)    GENERAL: well appearing middle aged female in NAD  SKIN: skin color, texture, turgor are normal, no rashes or significant lesions EYES: conjunctiva are pink and non-injected, sclera clear LUNGS: clear to auscultation and percussion with normal breathing effort HEART: regular rate & rhythm and no murmurs and no lower extremity edema ABDOMEN: soft, non-tender, non-distended, normal bowel sounds Musculoskeletal: no cyanosis of digits and no clubbing  PSYCH: alert & oriented x 3, fluent speech NEURO: no focal motor/sensory deficits  LABORATORY DATA:  I have reviewed the data as listed Recent Results (from the past 2160 hour(s))  Sed Rate (ESR)     Status: Abnormal   Collection Time: 07/23/19 11:16 AM  Result Value Ref Range   Sed Rate >100 Repeated and verified X2. (H) 0 - 30 mm/hr  TSH     Status: None   Collection Time: 07/23/19 11:16 AM  Result Value Ref Range   TSH 2.72 0.35 - 4.50 uIU/mL  Comp Met (CMET)     Status: Abnormal   Collection Time: 07/23/19 11:16 AM  Result Value Ref Range   Sodium 139 135  - 145 mEq/L   Potassium 3.7 3.5 - 5.1 mEq/L   Chloride 107 96 - 112 mEq/L   CO2 23 19 - 32 mEq/L   Glucose, Bld 110 (H) 70 - 99 mg/dL   BUN 14 6 - 23 mg/dL   Creatinine, Ser 0.92 0.40 - 1.20 mg/dL   Total Bilirubin 0.6 0.2 - 1.2 mg/dL   Alkaline Phosphatase 117 39 - 117 U/L   AST 19 0 - 37 U/L   ALT 12 0 - 35 U/L   Total Protein 7.3 6.0 - 8.3 g/dL   Albumin 3.5 3.5 - 5.2 g/dL   GFR 77.52 >60.00 mL/min   Calcium 9.9 8.4 - 10.5 mg/dL  CBC w/Diff     Status: Abnormal   Collection Time: 07/23/19 11:16 AM  Result Value Ref Range   WBC 6.6 4.0 - 10.5 K/uL   RBC 5.28 (H) 3.87 - 5.11 Mil/uL   Hemoglobin 13.1 12.0 - 15.0 g/dL   HCT 41.2 36.0 - 46.0 %   MCV 78.0 78.0 - 100.0 fl   MCHC 31.7 30.0 - 36.0 g/dL   RDW 14.7 11.5 - 15.5 %   Platelets 268.0 150.0 - 400.0 K/uL   Neutrophils Relative % 71.0 43.0 - 77.0 %   Lymphocytes Relative 19.9 12.0 - 46.0 %   Monocytes Relative 7.7 3.0 - 12.0 %   Eosinophils Relative 1.0 0.0 - 5.0 %   Basophils Relative 0.4 0.0 - 3.0 %   Neutro Abs 4.7 1.4 - 7.7 K/uL   Lymphs Abs 1.3 0.7 - 4.0 K/uL   Monocytes Absolute 0.5 0.1 - 1.0 K/uL   Eosinophils Absolute 0.1 0.0 - 0.7 K/uL   Basophils Absolute 0.0 0.0 - 0.1 K/uL  ANA     Status: None   Collection Time: 07/27/19  4:17 PM  Result Value Ref Range   Anti Nuclear Antibody (ANA) NEGATIVE NEGATIVE    Comment: ANA IFA is a first line screen for detecting the presence of up to approximately 150 autoantibodies in various autoimmune diseases. A negative ANA IFA result suggests  an ANA-associated autoimmune disease is not present at this time, but is not definitive. If there is high clinical suspicion for Sjogren's syndrome, testing for anti-SS-A/Ro antibody should be considered. Anti-Jo-1 antibody should be considered for clinically suspected inflammatory myopathies. . AC-0: Negative . International Consensus on ANA Patterns (https://www.hernandez-brewer.com/) . For additional information,  please refer to http://education.[employer].com/faq/FAQ177 (This link is being provided for informational/ educational purposes only.) .   Angiotensin converting enzyme     Status: None   Collection Time: 07/27/19  4:17 PM  Result Value Ref Range   Angiotensin-Converting Enzyme 26 9 - 67 U/L  SARS Coronavirus 2 (TAT 6-24 hrs)     Status: None   Collection Time: 08/02/19 12:00 AM  Result Value Ref Range   SARS Coronavirus 2 RESULT:  NEGATIVE     Comment: RESULT:  NEGATIVESARS-CoV-2 INTERPRETATION:A NEGATIVE  test result means that SARS-CoV-2 RNA was not present in the specimen above the limit of detection of this test. This does not preclude a possible SARS-CoV-2 infection and should not be used as the  sole basis for patient management decisions. Negative results must be combined with clinical observations, patient history, and epidemiological information. Optimum specimen types and timing for peak viral levels during infections caused by SARS-CoV-2  have not been determined. Collection of multiple specimens or types of specimens may be necessary to detect virus. Improper specimen collection and handling, sequence variability under primers/probes, or organism present below the limit of detection may  lead to false negative results. Positive and negative predictive values of testing are highly dependent on prevalence. False negative test results are more likely when prevalence of disease is high.The expected result is NEGATIVE.Fact  Sheet for  Healthcare Providers: https://www.woods-mathews.com/.Fact Sheet for Patients: SugarRoll.be.Normal Reference Range - Negative   ECHOCARDIOGRAM COMPLETE     Status: None   Collection Time: 08/03/19 12:51 PM  Result Value Ref Range   Weight 3,664 oz   Height 67 in   BP 120/88 mmHg  CEA (IN HOUSE-[facility])     Status: None   Collection Time: 08/06/19  2:06 PM  Result Value Ref Range   CEA ([facility]-In House) <1.00  0.00 - 5.00 ng/mL    Comment: (NOTE) This test was performed using Architect's Chemiluminescent Microparticle Immunoassay. Values obtained from different assay methods cannot be used interchangeably. Please note that 5-10% of patients who smoke may see CEA levels up to 6.9 ng/mL. Performed at [facility], [place] [address] [place], [place] [address]   CMP ([place] only)     Status: Abnormal   Collection Time: 08/06/19  2:06 PM  Result Value Ref Range   Sodium 139 135 - 145 mmol/L   Potassium 3.6 3.5 - 5.1 mmol/L   Chloride 107 98 - 111 mmol/L   CO2 21 (L) 22 - 32 mmol/L   Glucose, Bld 120 (H) 70 - 99 mg/dL   BUN 7 6 - 20 mg/dL   Creatinine 0.85 0.44 - 1.00 mg/dL   Calcium 9.5 8.9 - 10.3 mg/dL   Total Protein 7.5 6.5 - 8.1 g/dL   Albumin 3.0 (L) 3.5 - 5.0 g/dL   AST 30 15 - 41 U/L   ALT 19 0 - 44 U/L   Alkaline Phosphatase 140 (H) 38 - 126 U/L   Total Bilirubin 0.6 0.3 - 1.2 mg/dL   GFR, Est Non Af Am >60 >60 mL/min   GFR, Est AFR Am >60 >60 mL/min   Anion gap 11 5 - 15  Comment: Performed at [facility], [place] [address] [place], [place] [address]  CBC with Differential ([place] Only)     Status: Abnormal   Collection Time: 08/06/19  2:06 PM  Result Value Ref Range   WBC Count 8.0 4.0 - 10.5 K/uL   RBC 5.21 (H) 3.87 - 5.11 MIL/uL   Hemoglobin 12.7 12.0 - 15.0 g/dL   HCT 41.7 36.0 - 46.0 %   MCV 80.0 80.0 - 100.0 fL   MCH 24.4 (L) 26.0 - 34.0 pg   MCHC 30.5 30.0 - 36.0 g/dL   RDW 14.4 11.5 - 15.5 %   Platelet Count 264 150 - 400 K/uL   nRBC 0.0 0.0 - 0.2 %   Neutrophils Relative % 69 %   Neutro Abs 5.5 1.7 - 7.7 K/uL   Lymphocytes Relative 22 %   Lymphs Abs 1.7 0.7 - 4.0 K/uL   Monocytes Relative 8 %   Monocytes Absolute 0.6 0.1 - 1.0 K/uL   Eosinophils Relative 1 %   Eosinophils Absolute 0.1 0.0 - 0.5 K/uL   Basophils Relative 0 %   Basophils Absolute 0.0 0.0 - 0.1 K/uL   Immature Granulocytes 0 %    Abs Immature Granulocytes 0.03 0.00 - 0.07 K/uL    Comment: Performed at [facility], [place] [address], [place], [place] [address]  Protime-INR     Status: None   Collection Time: 08/06/19  2:07 PM  Result Value Ref Range   Prothrombin Time 14.0 11.4 - 15.2 seconds   INR 1.1 0.8 - 1.2    Comment: (NOTE) INR goal varies based on device and disease states. Performed at [facility], [place] [address] [place], [place] [address]   Lactate dehydrogenase (LDH)     Status: Abnormal   Collection Time: 08/06/19  2:08 PM  Result Value Ref Range   LDH 450 (H) 98 - 192 U/L    Comment: Performed at [facility], [place] [address], [place], [place] [address]    PATHOLOGY: None available yet. Biopsy scheduled for 08/17/2019.  RADIOGRAPHIC STUDIES: I have personally reviewed the radiological images as listed and agreed with the findings in the report: widely metastatic diease involving the bases of the lungs, liver, and abdominal lymph nodes.  DG Chest 2 View  Result Date: 07/27/2019 CLINICAL DATA:  Elevated sedimentation rate. Fatigue. Shortness of breath. EXAM: CHEST - 2 VIEW COMPARISON:  None. FINDINGS: There are linear airspace opacities at the lung bases bilaterally, right worse than left. There is mild elevation of the right hemidiaphragm. The heart size is enlarged. Aortic calcifications are noted. There is a small rounded density projecting over the right mid lung zone measuring approximately 4 mm. This is favored to represent a calcified granuloma. There is no pneumothorax or significant pleural effusion. IMPRESSION: 1. Bibasilar linear airspace opacities, right greater than left. This may reflect atelectasis or pneumonia. 2. A 4 mm rounded density projecting over the right midlung zone is favored to represent a calcified granuloma. 3. Cardiomegaly. Electronically Signed   By: Constance Holster M.D.   On: 07/27/2019 20:19   CT  CHEST W CONTRAST  Result Date: 08/02/2019 CLINICAL DATA:  Liver masses on a recent CT. Assessing for primary malignancy. Epigastric abdominal pain. Cough for the past 2 weeks. EXAM: CT CHEST WITH CONTRAST TECHNIQUE: Multidetector CT imaging of the chest was performed during intravenous contrast administration. CONTRAST:  50m OMNIPAQUE IOHEXOL 300 MG/ML  SOLN COMPARISON:  Abdomen and pelvis CT dated 07/30/2019. FINDINGS: Cardiovascular: Moderate-sized pericardial effusion with an interval increase in size, measuring 14 mm in maximum thickness. Normal sized heart. Mild atheromatous aortic calcifications. Mediastinum/Nodes: Previously demonstrated small hiatal hernia with eccentric wall thickening. The wall thickening is on the right and has lobulated contours, measuring 13 mm in maximum thickness on image number 109 series 2. No enlarged lymph nodes. Lungs/Pleura: Small right pleural effusion. Small number of bilateral subcentimeter lung nodules there is also a small calcified granuloma in the right lower lobe. Upper Abdomen: Previously demonstrated large right lobe liver mass and poorly defined additional masses. Musculoskeletal: No evidence of bony metastatic disease. Lower thoracic spine degenerative change. IMPRESSION: 1. Moderate-sized pericardial effusion with an interval increase in size. 2. Previously demonstrated small hiatal hernia with eccentric wall thickening. The wall thickening is on the right and has lobulated contours, suggesting the possibility of a primary malignancy. Further evaluation with endoscopy and biopsy is recommended. 3. Small number of bilateral subcentimeter lung nodules and a small calcified granuloma in the right lower lobe. The noncalcified nodules are suspicious for metastases. Noncalcified granulomata are also a possibility. 4. Previously demonstrated large right lobe liver mass and poorly defined additional masses, suspicious for metastases. 5. Aortic atherosclerosis. Aortic  Atherosclerosis (ICD10-I70.0). Electronically Signed   By: Claudie Revering M.D.   On: 08/02/2019 14:32   CT Abdomen Pelvis W Contrast  Result Date: 07/30/2019 CLINICAL DATA:  Generalized abdominal pain and bloating for approximately 2 months. Constipation and fatigue. EXAM: CT ABDOMEN AND PELVIS WITH CONTRAST TECHNIQUE: Multidetector CT imaging of the abdomen and pelvis was performed using the standard protocol following bolus administration of intravenous contrast. CONTRAST:  164m OMNIPAQUE IOHEXOL 300 MG/ML  SOLN COMPARISON:  None. FINDINGS: Lower Chest: Multiple sub-cm pulmonary nodules are seen in both lung bases, suspicious for pulmonary metastases. Tiny right pleural effusion and small pericardial effusion or also seen. Hepatobiliary: Large hypovascular mass is seen involving both anterior and posterior segments of the right hepatic lobe. This measures 12.5 x 11.0 cm in maximum dimensions. Other much smaller hypovascular masses are seen elsewhere within the right lobe measuring to 1.7 cm. Differential diagnosis includes hepatic metastases and multifocal hepatocellular carcinoma. Gallbladder is unremarkable. No evidence of biliary ductal dilatation. Pancreas:  No mass or inflammatory changes. Spleen: Within normal limits in size and appearance. Adrenals/Urinary Tract: No masses identified. Left renal cysts again noted. No evidence of hydronephrosis. Stomach/Bowel: Mild eccentric wall thickening is seen at the GE junction, which could represent a small mass. No evidence of obstruction, inflammatory process or abnormal fluid collections. Vascular/Lymphatic: 1.4 cm left paraaortic lymph node is seen on image 11/2. 14 mm retroperitoneal lymph node is seen in the left paraaortic region. 13 mm lymph node seen in the portacaval space. Sub-cm retroperitoneal lymph nodes are also seen in the aortocaval space and porta hepatis. No pelvic lymphadenopathy identified. No abdominal aortic aneurysm. Reproductive: Markedly  enlarged uterus seen with multiple fibroids, largest in the right fundal region measuring 14.5 cm. No adnexal masses or abnormal free fluid identified. Other:  None. Musculoskeletal:  No suspicious bone lesions identified. IMPRESSION: 1. Multiple hypovascular liver masses, largest measuring 12.5 cm. These are suspicious for hepatic metastases, with multifocal hepatocellular carcinoma considered less likely. Suggest correlation with tumor markers. 2. Mild abdominal lymphadenopathy, consistent with metastatic disease. 3. Sub-cm pulmonary nodules in both lung bases, highly suspicious for pulmonary metastases. 4. Mild eccentric wall thickening at the GE junction, which could represent a small esophageal carcinoma. Consider upper  endoscopy for further evaluation. 5. Large uterine fibroids. These results will be called to the ordering clinician or representative by the Radiologist Assistant, and communication documented in the PACS or zVision Dashboard. Electronically Signed   By: Marlaine Hind M.D.   On: 07/30/2019 15:16   ECHOCARDIOGRAM COMPLETE  Result Date: 08/03/2019   ECHOCARDIOGRAM REPORT   Patient Name:   Melissa Hurley Date of Exam: 08/03/2019 Medical Rec #:  [id number]       Height:       67.0 in Accession #:    [id number]      Weight:       229.0 lb Date of Birth:  07/12/1966      BSA:          2.14 m Patient Age:    34 years        BP:           120/88 mmHg Patient Gender: F               HR:           104 bpm. Exam Location:  [place] Procedure: 2D Echo, Cardiac Doppler and Color Doppler STAT ECHO Indications:    I31.3 Pericardial effusion  History:        Patient has no prior history of Echocardiogram examinations.  Sonographer:    Jessee Avers, RDCS Referring Phys: [place]  1. There is a moderate circumferential pericardial effusion with prominent pericardial fat pad. There is normal RV function. There is no evidence of RV diastolic collapse or variation of TV/MV inflow to  suggest tamponade. The IVC is normal size and collapsable on the images provided. There is minimal movement of the RA wall in late systole, but this is a non-specific finding. Overall, there are no echocardiographic findings to suggest cardiac tamponade.  2. Left ventricular ejection fraction, by visual estimation, is 55 to 60%. The left ventricle has normal function. There is no left ventricular hypertrophy.  3. Indeterminate diastolic filling due to E-A fusion.  4. The left ventricle has no regional wall motion abnormalities.  5. Global right ventricle has normal systolic function.The right ventricular size is normal. No increase in right ventricular wall thickness.  6. Left atrial size was normal.  7. Right atrial size was normal.  8. Moderate pericardial effusion.  9. Presence of pericardial fat pad. 10. The pericardial effusion is circumferential. 11. Mild mitral annular calcification. 12. The mitral valve is degenerative. No evidence of mitral valve regurgitation. 13. The tricuspid valve is grossly normal. Tricuspid valve regurgitation is trivial. 14. The aortic valve is tricuspid. Aortic valve regurgitation is not visualized. No evidence of aortic valve sclerosis or stenosis. 15. The pulmonic valve was grossly normal. Pulmonic valve regurgitation is not visualized. 16. No prior Echocardiogram. FINDINGS  Left Ventricle: Left ventricular ejection fraction, by visual estimation, is 55 to 60%. The left ventricle has normal function. The left ventricle has no regional wall motion abnormalities. There is no left ventricular hypertrophy. Indeterminate diastolic filling due to E-A fusion. Right Ventricle: The right ventricular size is normal. No increase in right ventricular wall thickness. Global RV systolic function is has normal systolic function. Left Atrium: Left atrial size was normal in size. Right Atrium: Right atrial size was normal in size Pericardium: A moderately sized pericardial effusion is present. The  pericardial effusion is circumferential. There is no evidence of cardiac tamponade. Presence of pericardial fat pad. There is a moderate circumferential pericardial  effusion with prominent pericardial fat pad. There is normal RV function. There is no evidence of RV diastolic collapse or variation of TV/MV inflow to suggest tamponade. The IVC is normal size and collapsable on the images provided. There is minimal movement of the RA wall in late systole, but this is a non-specific finding. Overall, there are no echocardiographic findings to suggest cardiac tamponade. Mitral Valve: The mitral valve is degenerative in appearance. Mild mitral annular calcification. No evidence of mitral valve regurgitation. Tricuspid Valve: The tricuspid valve is grossly normal. Tricuspid valve regurgitation is trivial. Aortic Valve: The aortic valve is tricuspid. Aortic valve regurgitation is not visualized. The aortic valve is structurally normal, with no evidence of sclerosis or stenosis. Pulmonic Valve: The pulmonic valve was grossly normal. Pulmonic valve regurgitation is not visualized. Pulmonic regurgitation is not visualized. Aorta: The aortic root and ascending aorta are structurally normal, with no evidence of dilitation. IAS/Shunts: No atrial level shunt detected by color flow Doppler.  LEFT VENTRICLE PLAX 2D LVIDd:         4.10 cm  Diastology LVIDs:         2.30 cm  LV e' lateral:   6.74 cm/s LV PW:         1.00 cm  LV E/e' lateral: 9.5 LV IVS:        0.80 cm  LV e' medial:    7.72 cm/s LVOT diam:     1.90 cm  LV E/e' medial:  8.3 LV SV:         56 ml LV SV Index:   24.84 LVOT Area:     2.84 cm  RIGHT VENTRICLE RV Basal diam:  2.60 cm RV S prime:     19.00 cm/s TAPSE (M-mode): 2.2 cm LEFT ATRIUM             Index       RIGHT ATRIUM           Index LA diam:        3.40 cm 1.59 cm/m  RA Pressure: 3.00 mmHg LA Vol (A2C):   22.6 ml 10.55 ml/m RA Area:     9.86 cm LA Vol (A4C):   33.0 ml 15.41 ml/m RA Volume:   17.80 ml  8.31  ml/m LA Biplane Vol: 28.3 ml 13.21 ml/m  AORTIC VALVE LVOT Vmax:   93.30 cm/s LVOT Vmean:  64.900 cm/s LVOT VTI:    0.166 m  AORTA Ao Root diam: 3.20 cm Ao Asc diam:  3.50 cm MITRAL VALVE                        TRICUSPID VALVE                                     Estimated RAP:  3.00 mmHg  MV Decel Time: 158 msec             SHUNTS MV E velocity: 64.00 cm/s 103 cm/s  Systemic VTI:  0.17 m MV A velocity: 92.40 cm/s 70.3 cm/s Systemic Diam: 1.90 cm MV E/A ratio:  0.69       1.5  Eleonore Chiquito MD Electronically signed by Eleonore Chiquito MD Signature Date/Time: 08/03/2019/1:23:51 PM    Final     ASSESSMENT & PLAN Melissa Hurley 53 y.o. female with no remarkable past medical history who presents for evaluation of a newly diagnosed  metastatic disease, currently undergoing diagnostic evaluation.  After review of her labs, review of the scans, discussion with the patient her findings are most consistent with metastatic cancer of unclear primary.  Heavy on the differential includes Terry as well as GI primary.  Less likely to represent GYN malignancy versus pancreatic primary.  This would be an atypical presentation for hematological malignancy.  Today we will order serum tumor markers to help determine the etiology, as well as await the biopsy results from the scheduled 08/17/2019 biopsy.  At this time the patient's symptoms are minimal but some fatigue, shortness of breath, and right-sided pain when laying on that side in bed.  At this time no direct interventions required for these symptoms.  Once the biopsy results are available we will be able to provide more information regarding prognosis, treatment, and anticipatory guidance.  We will plan to see her back on 08/26/2018, however if the biopsy results are available for that time we can discuss these with her.  # Metastatic Disease of Unknown Primary, Workup Underway --malignancy represents [place] vs GYN primary. Possibly GI primary, less likely  lymphoma. --EGD/Colonoscopy showed no clear tumor/mass. Biopsy of a liver lesion is scheduled for 08/17/2019. --today will order Hepatitis B, C, AFP, CA 19-9, CEA, LDH, INR for initial serum marker analysis.  --await results of biopsy before treatment plan and prognosis can be determined --RTC 08/27/2019, presumably with results of biopsy available for discussion of plan moving forward  #Symptom Management --mild pain on right side when lying flat. Not severe enough to require intervention yet. --no nausea, vomiting, diarrhea, or appetite issues. --continue to monitor  Orders Placed This Encounter  Procedures  . CBC with Differential (Cancer Center Only)    Standing Status:   Future    Number of Occurrences:   1    Standing Expiration Date:   08/05/2020  . CMP ([place] only)    Standing Status:   Future    Number of Occurrences:   1    Standing Expiration Date:   08/05/2020  . Lactate dehydrogenase (LDH)    Standing Status:   Future    Number of Occurrences:   1    Standing Expiration Date:   08/05/2020  . Protime-INR    Standing Status:   Future    Number of Occurrences:   1    Standing Expiration Date:   08/05/2020  . Hepatitis B surface antibody    Standing Status:   Future    Number of Occurrences:   1    Standing Expiration Date:   08/05/2020  . Hepatitis B surface antigen    Standing Status:   Future    Number of Occurrences:   1    Standing Expiration Date:   08/05/2020  . Hepatitis B core antibody, total    Standing Status:   Future    Number of Occurrences:   1    Standing Expiration Date:   08/05/2020  . Hepatitis C antibody    Standing Status:   Future    Number of Occurrences:   1    Standing Expiration Date:   08/05/2020  . CA 19.9    Standing Status:   Future    Number of Occurrences:   1    Standing Expiration Date:   08/05/2020  . AFP tumor marker    Standing Status:   Future    Number of Occurrences:   1    Standing Expiration Date:  08/05/2020  . CEA (IN HOUSE-[facility])    Standing Status:   Future    Number of Occurrences:   1    Standing Expiration Date:   08/05/2020    All questions were answered. The patient knows to call the clinic with any problems, questions or concerns.  A total of more than 60 minutes were spent on this encounter and over half of that time was spent on counseling and coordination of care as outlined above.   Ledell Peoples, MD Department of Hematology/Oncology [place] [facility] Phone: [phone] Pager: [phone] Email: [email or web address]  08/06/2019 4:09 PM

## 2019-08-05 NOTE — Progress Notes (Signed)
[name] Female, 53 y.o., 09-18-1965 MRN:  [id number] Phone:  [phone] [name]) PCP:  Patient, No Pcp Per Coverage:  Sherre Poot Blue [employer] [place] With Radiology ([facility]-US 2) 08/17/2019 at 1:00 PM  RE: US Liver Biopsy Received: Today Message Contents  Markus Daft, MD  Arlyn Leak for US guided liver lesion biopsy.   [place]       Previous Messages   ----- Message -----  From: Garth Bigness D  Sent: 08/05/2019  9:04 AM EST  To: [facility] Procedure Requests  Subject: US Liver Biopsy                  Procedure:  US Liver Biopsy   Reason:  Liver Tumor   History:  CT in computer   Provider:  Dr. Owens Loffler P   Provider Contact: [phone]

## 2019-08-05 NOTE — Telephone Encounter (Signed)
[email or web address] 12/17 Echo Results

## 2019-08-06 ENCOUNTER — Inpatient Hospital Stay: Payer: BC Managed Care – PPO | Admitting: Hematology and Oncology

## 2019-08-06 ENCOUNTER — Other Ambulatory Visit: Payer: Self-pay

## 2019-08-06 ENCOUNTER — Encounter: Payer: Self-pay | Admitting: Hematology and Oncology

## 2019-08-06 ENCOUNTER — Ambulatory Visit: Payer: BC Managed Care – PPO | Admitting: Family Medicine

## 2019-08-06 ENCOUNTER — Telehealth: Payer: Self-pay

## 2019-08-06 ENCOUNTER — Inpatient Hospital Stay: Payer: BC Managed Care – PPO | Attending: Hematology and Oncology

## 2019-08-06 VITALS — BP 138/72 | HR 98 | Temp 98.3°F | Resp 17 | Ht 66.0 in | Wt 228.7 lb

## 2019-08-06 DIAGNOSIS — C801 Malignant (primary) neoplasm, unspecified: Secondary | ICD-10-CM

## 2019-08-06 DIAGNOSIS — R599 Enlarged lymph nodes, unspecified: Secondary | ICD-10-CM | POA: Diagnosis not present

## 2019-08-06 DIAGNOSIS — Z8041 Family history of malignant neoplasm of ovary: Secondary | ICD-10-CM

## 2019-08-06 DIAGNOSIS — Z803 Family history of malignant neoplasm of breast: Secondary | ICD-10-CM | POA: Insufficient documentation

## 2019-08-06 DIAGNOSIS — K769 Liver disease, unspecified: Secondary | ICD-10-CM | POA: Insufficient documentation

## 2019-08-06 DIAGNOSIS — R911 Solitary pulmonary nodule: Secondary | ICD-10-CM

## 2019-08-06 DIAGNOSIS — Z801 Family history of malignant neoplasm of trachea, bronchus and lung: Secondary | ICD-10-CM

## 2019-08-06 DIAGNOSIS — E663 Overweight: Secondary | ICD-10-CM | POA: Diagnosis not present

## 2019-08-06 LAB — CBC WITH DIFFERENTIAL (CANCER CENTER ONLY)
Abs Immature Granulocytes: 0.03 10*3/uL (ref 0.00–0.07)
Basophils Absolute: 0 10*3/uL (ref 0.0–0.1)
Basophils Relative: 0 %
Eosinophils Absolute: 0.1 10*3/uL (ref 0.0–0.5)
Eosinophils Relative: 1 %
HCT: 41.7 % (ref 36.0–46.0)
Hemoglobin: 12.7 g/dL (ref 12.0–15.0)
Immature Granulocytes: 0 %
Lymphocytes Relative: 22 %
Lymphs Abs: 1.7 10*3/uL (ref 0.7–4.0)
MCH: 24.4 pg — ABNORMAL LOW (ref 26.0–34.0)
MCHC: 30.5 g/dL (ref 30.0–36.0)
MCV: 80 fL (ref 80.0–100.0)
Monocytes Absolute: 0.6 10*3/uL (ref 0.1–1.0)
Monocytes Relative: 8 %
Neutro Abs: 5.5 10*3/uL (ref 1.7–7.7)
Neutrophils Relative %: 69 %
Platelet Count: 264 10*3/uL (ref 150–400)
RBC: 5.21 MIL/uL — ABNORMAL HIGH (ref 3.87–5.11)
RDW: 14.4 % (ref 11.5–15.5)
WBC Count: 8 10*3/uL (ref 4.0–10.5)
nRBC: 0 % (ref 0.0–0.2)

## 2019-08-06 LAB — LACTATE DEHYDROGENASE: LDH: 450 U/L — ABNORMAL HIGH (ref 98–192)

## 2019-08-06 LAB — CMP (CANCER CENTER ONLY)
ALT: 19 U/L (ref 0–44)
AST: 30 U/L (ref 15–41)
Albumin: 3 g/dL — ABNORMAL LOW (ref 3.5–5.0)
Alkaline Phosphatase: 140 U/L — ABNORMAL HIGH (ref 38–126)
Anion gap: 11 (ref 5–15)
BUN: 7 mg/dL (ref 6–20)
CO2: 21 mmol/L — ABNORMAL LOW (ref 22–32)
Calcium: 9.5 mg/dL (ref 8.9–10.3)
Chloride: 107 mmol/L (ref 98–111)
Creatinine: 0.85 mg/dL (ref 0.44–1.00)
GFR, Est AFR Am: >60 mL/min (ref >60)
GFR, Estimated: >60 mL/min (ref >60)
Glucose, Bld: 120 mg/dL — ABNORMAL HIGH (ref 70–99)
Potassium: 3.6 mmol/L (ref 3.5–5.1)
Sodium: 139 mmol/L (ref 135–145)
Total Bilirubin: 0.6 mg/dL (ref 0.3–1.2)
Total Protein: 7.5 g/dL (ref 6.5–8.1)

## 2019-08-06 LAB — HEPATITIS B CORE ANTIBODY, TOTAL: Hep B Core Total Ab: NONREACTIVE

## 2019-08-06 LAB — HEPATITIS C ANTIBODY: HCV Ab: NONREACTIVE

## 2019-08-06 LAB — PROTIME-INR
INR: 1.1 (ref 0.8–1.2)
Prothrombin Time: 14 seconds (ref 11.4–15.2)

## 2019-08-06 LAB — HEPATITIS B SURFACE ANTIGEN: Hepatitis B Surface Ag: NONREACTIVE

## 2019-08-06 LAB — HEPATITIS B SURFACE ANTIBODY,QUALITATIVE: Hep B S Ab: NONREACTIVE

## 2019-08-06 LAB — CEA (IN HOUSE-CHCC): CEA (CHCC-In House): <1 ng/mL (ref 0.00–5.00)

## 2019-08-06 NOTE — Telephone Encounter (Signed)
  Follow up Call-  Call back number 08/04/2019  Post procedure Call Back phone  # [phone]  Permission to leave phone message Yes  Some recent data might be hidden     No answer

## 2019-08-06 NOTE — Telephone Encounter (Signed)
  Follow up Call-  Call back number 08/04/2019  Post procedure Call Back phone  # [phone]  Permission to leave phone message Yes  Some recent data might be hidden     Left message

## 2019-08-07 LAB — CANCER ANTIGEN 19-9: CA 19-9: <2 U/mL (ref 0–35)

## 2019-08-07 LAB — AFP TUMOR MARKER: AFP, Serum, Tumor Marker: 18 ng/mL — ABNORMAL HIGH (ref 0.0–8.3)

## 2019-08-16 ENCOUNTER — Other Ambulatory Visit: Payer: Self-pay | Admitting: Radiology

## 2019-08-17 ENCOUNTER — Other Ambulatory Visit: Payer: Self-pay

## 2019-08-17 ENCOUNTER — Ambulatory Visit (HOSPITAL_COMMUNITY)
Admission: RE | Admit: 2019-08-17 | Discharge: 2019-08-17 | Disposition: A | Discharge status: Home / Self Care | Payer: BC Managed Care – PPO | Source: Ambulatory Visit | Attending: Gastroenterology | Admitting: Gastroenterology

## 2019-08-17 DIAGNOSIS — D490 Neoplasm of unspecified behavior of digestive system: Secondary | ICD-10-CM

## 2019-08-17 DIAGNOSIS — Z79899 Other long term (current) drug therapy: Secondary | ICD-10-CM | POA: Diagnosis not present

## 2019-08-17 DIAGNOSIS — C227 Other specified carcinomas of liver: Secondary | ICD-10-CM | POA: Diagnosis not present

## 2019-08-17 DIAGNOSIS — R160 Hepatomegaly, not elsewhere classified: Secondary | ICD-10-CM | POA: Diagnosis present

## 2019-08-17 LAB — PROTIME-INR
INR: 1.1 (ref 0.8–1.2)
Prothrombin Time: 14 seconds (ref 11.4–15.2)

## 2019-08-17 LAB — CBC
HCT: 44.6 % (ref 36.0–46.0)
Hemoglobin: 13.2 g/dL (ref 12.0–15.0)
MCH: 24.4 pg — ABNORMAL LOW (ref 26.0–34.0)
MCHC: 29.6 g/dL — ABNORMAL LOW (ref 30.0–36.0)
MCV: 82.4 fL (ref 80.0–100.0)
Platelets: 278 10*3/uL (ref 150–400)
RBC: 5.41 MIL/uL — ABNORMAL HIGH (ref 3.87–5.11)
RDW: 14.8 % (ref 11.5–15.5)
WBC: 7.5 10*3/uL (ref 4.0–10.5)
nRBC: 0 % (ref 0.0–0.2)

## 2019-08-17 LAB — APTT: aPTT: 32 seconds (ref 24–36)

## 2019-08-17 MED ORDER — FENTANYL CITRATE (PF) 100 MCG/2ML IJ SOLN
INTRAMUSCULAR | Status: AC | PRN
  Administered 2019-08-17: 50 ug via INTRAVENOUS

## 2019-08-17 MED ORDER — MIDAZOLAM HCL 2 MG/2ML IJ SOLN
INTRAMUSCULAR | Status: AC
  Filled 2019-08-17: qty 2

## 2019-08-17 MED ORDER — MIDAZOLAM HCL 2 MG/2ML IJ SOLN
INTRAMUSCULAR | Status: AC | PRN
  Administered 2019-08-17 (×2): 1 mg via INTRAVENOUS

## 2019-08-17 MED ORDER — LIDOCAINE-EPINEPHRINE 1 %-1:100000 IJ SOLN
INTRAMUSCULAR | Status: AC
  Filled 2019-08-17: qty 1

## 2019-08-17 MED ORDER — FENTANYL CITRATE (PF) 100 MCG/2ML IJ SOLN
INTRAMUSCULAR | Status: AC
  Filled 2019-08-17: qty 2

## 2019-08-17 MED ORDER — SODIUM CHLORIDE 0.9 % IV SOLN: INTRAVENOUS | Status: DC

## 2019-08-17 MED ORDER — GELATIN ABSORBABLE 12-7 MM EX MISC
CUTANEOUS | Status: AC
  Filled 2019-08-17: qty 1

## 2019-08-17 NOTE — Procedures (Signed)
Pre Procedure Dx: Liver mass Post Procedural Dx: Same  Technically successful US guided biopsy of large infiltrative mass within the right lobe of the liver.   EBL: None No immediate complications.   Ronny Bacon, MD Pager #: [phone]

## 2019-08-17 NOTE — H&P (Signed)
Chief Complaint: Patient was seen in consultation today for liver masses/biopsy.  Referring Physician(s): Milus Banister  Supervising Physician: Sandi Mariscal  Patient Status: [facility] - Out-pt  History of Present Illness: Melissa Hurley is a 53 y.o. female with an unremarkable past medical history. She has had a 3-4 month history of abdominal bloating/discomfort, fatigue, and exertional dyspnea. She went to GI for further evaluation who ordered imaging scans for further evaluation.  CT abdomen/pelvis 07/30/2019: 1. Multiple hypovascular liver masses, largest measuring 12.5 cm. These are suspicious for hepatic metastases, with multifocal hepatocellular carcinoma considered less likely. Suggest correlation with tumor markers. 2. Mild abdominal lymphadenopathy, consistent with metastatic disease. 3. Sub-cm pulmonary nodules in both lung bases, highly suspicious for pulmonary metastases. 4. Mild eccentric wall thickening at the GE junction, which could represent a small esophageal carcinoma. Consider upper endoscopy for further evaluation. 5. Large uterine fibroids.  IR requested by Dr. Ardis Hughs for possible image-guided liver mass biopsy. Patient awake and alert laying in bed. Complains of intermittent RUQ pain/bloating, stable at this time. Complains of exertional dyspnea, stable at this time. Denies dyspnea at rest. Denies fever, chills, chest pain, or headache.   Past Medical History:  Diagnosis Date  . [facility])    Liver    Past Surgical History:  Procedure Laterality Date  . CESAREAN SECTION      Allergies: Patient has no known allergies.  Medications: Prior to Admission medications   Medication Sig Start Date End Date Taking? Authorizing Provider  fluconazole (DIFLUCAN) 100 MG tablet Take 1 tablet (100 mg total) by mouth daily. 08/04/19  Yes Milus Banister, MD  omeprazole (PRILOSEC) 40 MG capsule Take 1 capsule (40 mg total) by mouth daily before breakfast. 07/23/19    Esterwood, [name] S, PA-C     Family History  Problem Relation Age of Onset  . Breast cancer Mother        35's  . Ovarian cancer Mother   . Lung cancer Mother   . Ovarian cancer Maternal Aunt   . Breast cancer Maternal Aunt   . Breast cancer Maternal Grandmother   . Colon cancer Neg Hx   . Esophageal cancer Neg Hx   . Rectal cancer Neg Hx   . Stomach cancer Neg Hx     Social History   Socioeconomic History  . Marital status: Married    Spouse name: Not on file  . Number of children: Not on file  . Years of education: Not on file  . Highest education level: Not on file  Occupational History  . Occupation: Pharmacist, hospital  Tobacco Use  . Smoking status: Never Smoker  . Smokeless tobacco: Never Used  Substance and Sexual Activity  . Alcohol use: Never  . Drug use: Never  . Sexual activity: Not on file  Other Topics Concern  . Not on file  Social History Narrative  . Not on file   Social Determinants of Health   Financial Resource Strain:   . Difficulty of Paying Living Expenses: Not on file  Food Insecurity:   . Worried About Charity fundraiser in the Last Year: Not on file  . Ran Out of Food in the Last Year: Not on file  Transportation Needs:   . Lack of Transportation (Medical): Not on file  . Lack of Transportation (Non-Medical): Not on file  Physical Activity:   . Days of Exercise per Week: Not on file  . Minutes of Exercise per Session: Not on file  Stress:   . Feeling of Stress : Not on file  Social Connections:   . Frequency of Communication with Friends and Family: Not on file  . Frequency of Social Gatherings with Friends and Family: Not on file  . Attends Religious Services: Not on file  . Active Member of Clubs or Organizations: Not on file  . Attends Archivist Meetings: Not on file  . Marital Status: Not on file     Review of Systems: A 12 point ROS discussed and pertinent positives are indicated in the HPI above.  All other systems are  negative.  Review of Systems  Constitutional: Negative for chills and fever.  Respiratory: Positive for shortness of breath. Negative for wheezing.   Cardiovascular: Negative for chest pain and palpitations.  Gastrointestinal: Positive for abdominal distention and abdominal pain.  Neurological: Negative for headaches.  Psychiatric/Behavioral: Negative for behavioral problems and confusion.    Vital Signs: BP (!) 129/96   Pulse (!) 110   Temp (!) 96.5 F (35.8 C) (Skin)   Resp 16   Ht 5\' 7"  (1.702 m)   Wt 230 lb (104.3 kg)   SpO2 99%   BMI 36.02 kg/m   Physical Exam Vitals and nursing note reviewed.  Constitutional:      General: She is not in acute distress.    Appearance: Normal appearance.  Cardiovascular:     Rate and Rhythm: Regular rhythm. Tachycardia present.     Pulses: Normal pulses.     Heart sounds: Normal heart sounds. No murmur.  Pulmonary:     Effort: Pulmonary effort is normal. No respiratory distress.     Breath sounds: Normal breath sounds. No wheezing.  Skin:    General: Skin is warm and dry.  Neurological:     Mental Status: She is alert and oriented to person, place, and time.  Psychiatric:        Mood and Affect: Mood normal.        Behavior: Behavior normal.      MD Evaluation Airway: WNL Heart: WNL Abdomen: WNL Chest/ Lungs: WNL ASA  Classification: 3 Mallampati/Airway Score: Two   Imaging: DG Chest 2 View  Result Date: 07/27/2019 CLINICAL DATA:  Elevated sedimentation rate. Fatigue. Shortness of breath. EXAM: CHEST - 2 VIEW COMPARISON:  None. FINDINGS: There are linear airspace opacities at the lung bases bilaterally, right worse than left. There is mild elevation of the right hemidiaphragm. The heart size is enlarged. Aortic calcifications are noted. There is a small rounded density projecting over the right mid lung zone measuring approximately 4 mm. This is favored to represent a calcified granuloma. There is no pneumothorax or  significant pleural effusion. IMPRESSION: 1. Bibasilar linear airspace opacities, right greater than left. This may reflect atelectasis or pneumonia. 2. A 4 mm rounded density projecting over the right midlung zone is favored to represent a calcified granuloma. 3. Cardiomegaly. Electronically Signed   By: Constance Holster M.D.   On: 07/27/2019 20:19   CT CHEST W CONTRAST  Result Date: 08/02/2019 CLINICAL DATA:  Liver masses on a recent CT. Assessing for primary malignancy. Epigastric abdominal pain. Cough for the past 2 weeks. EXAM: CT CHEST WITH CONTRAST TECHNIQUE: Multidetector CT imaging of the chest was performed during intravenous contrast administration. CONTRAST:  85mL OMNIPAQUE IOHEXOL 300 MG/ML  SOLN COMPARISON:  Abdomen and pelvis CT dated 07/30/2019. FINDINGS: Cardiovascular: Moderate-sized pericardial effusion with an interval increase in size, measuring 14 mm in maximum thickness. Normal sized heart. Mild  atheromatous aortic calcifications. Mediastinum/Nodes: Previously demonstrated small hiatal hernia with eccentric wall thickening. The wall thickening is on the right and has lobulated contours, measuring 13 mm in maximum thickness on image number 109 series 2. No enlarged lymph nodes. Lungs/Pleura: Small right pleural effusion. Small number of bilateral subcentimeter lung nodules there is also a small calcified granuloma in the right lower lobe. Upper Abdomen: Previously demonstrated large right lobe liver mass and poorly defined additional masses. Musculoskeletal: No evidence of bony metastatic disease. Lower thoracic spine degenerative change. IMPRESSION: 1. Moderate-sized pericardial effusion with an interval increase in size. 2. Previously demonstrated small hiatal hernia with eccentric wall thickening. The wall thickening is on the right and has lobulated contours, suggesting the possibility of a primary malignancy. Further evaluation with endoscopy and biopsy is recommended. 3. Small  number of bilateral subcentimeter lung nodules and a small calcified granuloma in the right lower lobe. The noncalcified nodules are suspicious for metastases. Noncalcified granulomata are also a possibility. 4. Previously demonstrated large right lobe liver mass and poorly defined additional masses, suspicious for metastases. 5. Aortic atherosclerosis. Aortic Atherosclerosis (ICD10-I70.0). Electronically Signed   By: Claudie Revering M.D.   On: 08/02/2019 14:32   CT Abdomen Pelvis W Contrast  Result Date: 07/30/2019 CLINICAL DATA:  Generalized abdominal pain and bloating for approximately 2 months. Constipation and fatigue. EXAM: CT ABDOMEN AND PELVIS WITH CONTRAST TECHNIQUE: Multidetector CT imaging of the abdomen and pelvis was performed using the standard protocol following bolus administration of intravenous contrast. CONTRAST:  119mL OMNIPAQUE IOHEXOL 300 MG/ML  SOLN COMPARISON:  None. FINDINGS: Lower Chest: Multiple sub-cm pulmonary nodules are seen in both lung bases, suspicious for pulmonary metastases. Tiny right pleural effusion and small pericardial effusion or also seen. Hepatobiliary: Large hypovascular mass is seen involving both anterior and posterior segments of the right hepatic lobe. This measures 12.5 x 11.0 cm in maximum dimensions. Other much smaller hypovascular masses are seen elsewhere within the right lobe measuring to 1.7 cm. Differential diagnosis includes hepatic metastases and multifocal hepatocellular carcinoma. Gallbladder is unremarkable. No evidence of biliary ductal dilatation. Pancreas:  No mass or inflammatory changes. Spleen: Within normal limits in size and appearance. Adrenals/Urinary Tract: No masses identified. Left renal cysts again noted. No evidence of hydronephrosis. Stomach/Bowel: Mild eccentric wall thickening is seen at the GE junction, which could represent a small mass. No evidence of obstruction, inflammatory process or abnormal fluid collections.  Vascular/Lymphatic: 1.4 cm left paraaortic lymph node is seen on image 11/2. 14 mm retroperitoneal lymph node is seen in the left paraaortic region. 13 mm lymph node seen in the portacaval space. Sub-cm retroperitoneal lymph nodes are also seen in the aortocaval space and porta hepatis. No pelvic lymphadenopathy identified. No abdominal aortic aneurysm. Reproductive: Markedly enlarged uterus seen with multiple fibroids, largest in the right fundal region measuring 14.5 cm. No adnexal masses or abnormal free fluid identified. Other:  None. Musculoskeletal:  No suspicious bone lesions identified. IMPRESSION: 1. Multiple hypovascular liver masses, largest measuring 12.5 cm. These are suspicious for hepatic metastases, with multifocal hepatocellular carcinoma considered less likely. Suggest correlation with tumor markers. 2. Mild abdominal lymphadenopathy, consistent with metastatic disease. 3. Sub-cm pulmonary nodules in both lung bases, highly suspicious for pulmonary metastases. 4. Mild eccentric wall thickening at the GE junction, which could represent a small esophageal carcinoma. Consider upper endoscopy for further evaluation. 5. Large uterine fibroids. These results will be called to the ordering clinician or representative by the Radiologist Assistant, and communication documented in  the PACS or zVision Dashboard. Electronically Signed   By: Marlaine Hind M.D.   On: 07/30/2019 15:16   ECHOCARDIOGRAM COMPLETE  Result Date: 08/03/2019   ECHOCARDIOGRAM REPORT   Patient Name:   Melissa Hurley Date of Exam: 08/03/2019 Medical Rec #:  [id number]       Height:       67.0 in Accession #:    [id number]      Weight:       229.0 lb Date of Birth:  04/01/1966      BSA:          2.14 m Patient Age:    55 years        BP:           120/88 mmHg Patient Gender: F               HR:           104 bpm. Exam Location:  [facility] Procedure: 2D Echo, Cardiac Doppler and Color Doppler STAT ECHO Indications:    I31.3  Pericardial effusion  History:        Patient has no prior history of Echocardiogram examinations.  Sonographer:    Jessee Avers, RDCS Referring Phys: [place]  1. There is a moderate circumferential pericardial effusion with prominent pericardial fat pad. There is normal RV function. There is no evidence of RV diastolic collapse or variation of TV/MV inflow to suggest tamponade. The IVC is normal size and collapsable on the images provided. There is minimal movement of the RA wall in late systole, but this is a non-specific finding. Overall, there are no echocardiographic findings to suggest cardiac tamponade.  2. Left ventricular ejection fraction, by visual estimation, is 55 to 60%. The left ventricle has normal function. There is no left ventricular hypertrophy.  3. Indeterminate diastolic filling due to E-A fusion.  4. The left ventricle has no regional wall motion abnormalities.  5. Global right ventricle has normal systolic function.The right ventricular size is normal. No increase in right ventricular wall thickness.  6. Left atrial size was normal.  7. Right atrial size was normal.  8. Moderate pericardial effusion.  9. Presence of pericardial fat pad. 10. The pericardial effusion is circumferential. 11. Mild mitral annular calcification. 12. The mitral valve is degenerative. No evidence of mitral valve regurgitation. 13. The tricuspid valve is grossly normal. Tricuspid valve regurgitation is trivial. 14. The aortic valve is tricuspid. Aortic valve regurgitation is not visualized. No evidence of aortic valve sclerosis or stenosis. 15. The pulmonic valve was grossly normal. Pulmonic valve regurgitation is not visualized. 16. No prior Echocardiogram. FINDINGS  Left Ventricle: Left ventricular ejection fraction, by visual estimation, is 55 to 60%. The left ventricle has normal function. The left ventricle has no regional wall motion abnormalities. There is no left ventricular  hypertrophy. Indeterminate diastolic filling due to E-A fusion. Right Ventricle: The right ventricular size is normal. No increase in right ventricular wall thickness. Global RV systolic function is has normal systolic function. Left Atrium: Left atrial size was normal in size. Right Atrium: Right atrial size was normal in size Pericardium: A moderately sized pericardial effusion is present. The pericardial effusion is circumferential. There is no evidence of cardiac tamponade. Presence of pericardial fat pad. There is a moderate circumferential pericardial effusion with prominent pericardial fat pad. There is normal RV function. There is no evidence of RV diastolic collapse or variation of TV/MV inflow to suggest tamponade.  The IVC is normal size and collapsable on the images provided. There is minimal movement of the RA wall in late systole, but this is a non-specific finding. Overall, there are no echocardiographic findings to suggest cardiac tamponade. Mitral Valve: The mitral valve is degenerative in appearance. Mild mitral annular calcification. No evidence of mitral valve regurgitation. Tricuspid Valve: The tricuspid valve is grossly normal. Tricuspid valve regurgitation is trivial. Aortic Valve: The aortic valve is tricuspid. Aortic valve regurgitation is not visualized. The aortic valve is structurally normal, with no evidence of sclerosis or stenosis. Pulmonic Valve: The pulmonic valve was grossly normal. Pulmonic valve regurgitation is not visualized. Pulmonic regurgitation is not visualized. Aorta: The aortic root and ascending aorta are structurally normal, with no evidence of dilitation. IAS/Shunts: No atrial level shunt detected by color flow Doppler.  LEFT VENTRICLE PLAX 2D LVIDd:         4.10 cm  Diastology LVIDs:         2.30 cm  LV e' lateral:   6.74 cm/s LV PW:         1.00 cm  LV E/e' lateral: 9.5 LV IVS:        0.80 cm  LV e' medial:    7.72 cm/s LVOT diam:     1.90 cm  LV E/e' medial:  8.3 LV  SV:         56 ml LV SV Index:   24.84 LVOT Area:     2.84 cm  RIGHT VENTRICLE RV Basal diam:  2.60 cm RV S prime:     19.00 cm/s TAPSE (M-mode): 2.2 cm LEFT ATRIUM             Index       RIGHT ATRIUM           Index LA diam:        3.40 cm 1.59 cm/m  RA Pressure: 3.00 mmHg LA Vol (A2C):   22.6 ml 10.55 ml/m RA Area:     9.86 cm LA Vol (A4C):   33.0 ml 15.41 ml/m RA Volume:   17.80 ml  8.31 ml/m LA Biplane Vol: 28.3 ml 13.21 ml/m  AORTIC VALVE LVOT Vmax:   93.30 cm/s LVOT Vmean:  64.900 cm/s LVOT VTI:    0.166 m  AORTA Ao Root diam: 3.20 cm Ao Asc diam:  3.50 cm MITRAL VALVE                        TRICUSPID VALVE                                     Estimated RAP:  3.00 mmHg  MV Decel Time: 158 msec             SHUNTS MV E velocity: 64.00 cm/s 103 cm/s  Systemic VTI:  0.17 m MV A velocity: 92.40 cm/s 70.3 cm/s Systemic Diam: 1.90 cm MV E/A ratio:  0.69       1.5  Eleonore Chiquito MD Electronically signed by Eleonore Chiquito MD Signature Date/Time: 08/03/2019/1:23:51 PM    Final     Labs:  CBC: Recent Labs    07/23/19 1116 08/06/19 1406  WBC 6.6 8.0  HGB 13.1 12.7  HCT 41.2 41.7  PLT 268.0 264    COAGS: Recent Labs    08/06/19 1407  INR 1.1    BMP: Recent Labs  07/23/19 1116 08/06/19 1406  NA 139 139  K 3.7 3.6  CL 107 107  CO2 23 21*  GLUCOSE 110* 120*  BUN 14 7  CALCIUM 9.9 9.5  CREATININE 0.92 0.85  GFRNONAA  --  >60  GFRAA  --  >60    LIVER FUNCTION TESTS: Recent Labs    07/23/19 1116 08/06/19 1406  BILITOT 0.6 0.6  AST 19 30  ALT 12 19  ALKPHOS 117 140*  PROT 7.3 7.5  ALBUMIN 3.5 3.0*     Assessment and Plan:  Liver masses. Plan for image-guided liver mass biopsy today in IR. Patient is NPO. Afebrile and WBCs WNL. She does not take blood thinners. INR pending.  Risks and benefits discussed with the patient including, but not limited to bleeding, infection, damage to adjacent structures or low yield requiring additional tests. All of the patient's  questions were answered, patient is agreeable to proceed. Consent signed and in chart.   Thank you for this interesting consult.  I greatly enjoyed meeting Melissa Hurley and look forward to participating in their care.  A copy of this report was sent to the requesting provider on this date.  Electronically Signed: Earley Abide, PA-C 08/17/2019, 11:51 AM   I spent a total of 30 Minutes in face to face in clinical consultation, greater than 50% of which was counseling/coordinating care for liver masses/biopsy.

## 2019-08-17 NOTE — Discharge Instructions (Signed)
Moderate Conscious Sedation, Adult, Care After These instructions provide you with information about caring for yourself after your procedure. Your health care provider may also give you more specific instructions. Your treatment has been planned according to current medical practices, but problems sometimes occur. Call your health care provider if you have any problems or questions after your procedure. What can I expect after the procedure? After your procedure, it is common:  To feel sleepy for several hours.  To feel clumsy and have poor balance for several hours.  To have poor judgment for several hours.  To vomit if you eat too soon. Follow these instructions at home: For at least 24 hours after the procedure:   Do not: ? Participate in activities where you could fall or become injured. ? Drive. ? Use heavy machinery. ? Drink alcohol. ? Take sleeping pills or medicines that cause drowsiness. ? Make important decisions or sign legal documents. ? Take care of children on your own.  Rest. Eating and drinking  Follow the diet recommended by your health care provider.  If you vomit: ? Drink water, juice, or soup when you can drink without vomiting. ? Make sure you have little or no nausea before eating solid foods. General instructions  Have a responsible adult stay with you until you are awake and alert.  Take over-the-counter and prescription medicines only as told by your health care provider.  If you smoke, do not smoke without supervision.  Keep all follow-up visits as told by your health care provider. This is important. Contact a health care provider if:  You keep feeling nauseous or you keep vomiting.  You feel light-headed.  You develop a rash.  You have a fever. Get help right away if:  You have trouble breathing. This information is not intended to replace advice given to you by your health care provider. Make sure you discuss any questions you have  with your health care provider. Document Released: 05/26/2013 Document Revised: 07/18/2017 Document Reviewed: 11/25/2015 Elsevier Patient Education  2020 [place]. Liver Biopsy, Care After These instructions give you information on caring for yourself after your procedure. Your doctor may also give you more specific instructions. Call your doctor if you have any problems or questions after your procedure. What can I expect after the procedure? After the procedure, it is common to have:  Pain and soreness where the biopsy was done.  Bruising around the area where the biopsy was done.  Sleepiness and be tired for a few days. Follow these instructions at home: Medicines  Take over-the-counter and prescription medicines only as told by your doctor.  If you were prescribed an antibiotic medicine, take it as told by your doctor. Do not stop taking the antibiotic even if you start to feel better.  Do not take medicines such as aspirin and ibuprofen. These medicines can thin your blood. Do not take these medicines unless your doctor tells you to take them.  If you are taking prescription pain medicine, take actions to prevent or treat constipation. Your doctor may recommend that you: ? Drink enough fluid to keep your pee (urine) clear or pale yellow. ? Take over-the-counter or prescription medicines. ? Eat foods that are high in fiber, such as fresh fruits and vegetables, whole grains, and beans. ? Limit foods that are high in fat and processed sugars, such as fried and sweet foods. Caring for your cut  Follow instructions from your doctor about how to take care of your cuts  from surgery (incisions). Make sure you: ? Wash your hands with soap and water before you change your bandage (dressing). If you cannot use soap and water, use hand sanitizer. ? Change your bandage as told by your doctor. ? Leave stitches (sutures), skin glue, or skin tape (adhesive) strips in place. They may need to  stay in place for 2 weeks or longer. If tape strips get loose and curl up, you may trim the loose edges. Do not remove tape strips completely unless your doctor says it is okay.  Check your cuts every day for signs of infection. Check for: ? Redness, swelling, or more pain. ? Fluid or blood. ? Pus or a bad smell. ? Warmth.  Do not take baths, swim, or use a hot tub until your doctor says it is okay to do so. Activity   Rest at home for 1-2 days or as told by your doctor. ? Avoid sitting for a long time without moving. Get up to take short walks every 1-2 hours.  Return to your normal activities as told by your doctor. Ask what activities are safe for you.  Do not do these things in the first 24 hours: ? Drive. ? Use machinery. ? Take a bath or shower.  Do not lift more than 10 pounds (4.5 kg) or play contact sports for the first 2 weeks. General instructions   Do not drink alcohol in the first week after the procedure.  Have someone stay with you for at least 24 hours after the procedure.  Get your test results. Ask your doctor or the department that is doing the test: ? When will my results be ready? ? How will I get my results? ? What are my treatment options? ? What other tests do I need? ? What are my next steps?  Keep all follow-up visits as told by your doctor. This is important. Contact a doctor if:  A cut bleeds and leaves more than just a small spot of blood.  A cut is red, puffs up (swells), or hurts more than before.  Fluid or something else comes from a cut.  A cut smells bad.  You have a fever or chills. Get help right away if:  You have swelling, bloating, or pain in your belly (abdomen).  You get dizzy or faint.  You have a rash.  You feel sick to your stomach (nauseous) or throw up (vomit).  You have trouble breathing, feel short of breath, or feel faint.  Your chest hurts.  You have problems talking or seeing.  You have trouble with  your balance or moving your arms or legs. Summary  After the procedure, it is common to have pain, soreness, bruising, and tiredness.  Your doctor will tell you how to take care of yourself at home. Change your bandage, take your medicines, and limit your activities as told by your doctor.  Call your doctor if you have symptoms of infection. Get help right away if your belly swells, your cut bleeds a lot, or you have trouble talking or breathing. This information is not intended to replace advice given to you by your health care provider. Make sure you discuss any questions you have with your health care provider. Document Released: 05/14/2008 Document Revised: 08/15/2017 Document Reviewed: 08/15/2017 Elsevier Patient Education  2020 [employer].

## 2019-08-19 LAB — SURGICAL PATHOLOGY

## 2019-08-27 ENCOUNTER — Encounter: Payer: Self-pay | Admitting: Gynecologic Oncology

## 2019-08-27 ENCOUNTER — Inpatient Hospital Stay (HOSPITAL_BASED_OUTPATIENT_CLINIC_OR_DEPARTMENT_OTHER): Payer: BC Managed Care – PPO | Admitting: Gynecologic Oncology

## 2019-08-27 ENCOUNTER — Telehealth: Payer: Self-pay

## 2019-08-27 ENCOUNTER — Inpatient Hospital Stay: Payer: BC Managed Care – PPO | Attending: Hematology and Oncology

## 2019-08-27 ENCOUNTER — Other Ambulatory Visit: Payer: Self-pay

## 2019-08-27 ENCOUNTER — Inpatient Hospital Stay: Payer: BC Managed Care – PPO | Admitting: Hematology and Oncology

## 2019-08-27 VITALS — BP 131/102 | HR 119 | Temp 98.0°F | Resp 20 | Ht 67.0 in | Wt 225.0 lb

## 2019-08-27 VITALS — BP 132/92 | HR 110 | Temp 98.2°F | Resp 16 | Ht 67.0 in | Wt 227.7 lb

## 2019-08-27 DIAGNOSIS — N852 Hypertrophy of uterus: Secondary | ICD-10-CM | POA: Diagnosis not present

## 2019-08-27 DIAGNOSIS — C229 Malignant neoplasm of liver, not specified as primary or secondary: Secondary | ICD-10-CM

## 2019-08-27 DIAGNOSIS — C772 Secondary and unspecified malignant neoplasm of intra-abdominal lymph nodes: Secondary | ICD-10-CM

## 2019-08-27 DIAGNOSIS — C801 Malignant (primary) neoplasm, unspecified: Secondary | ICD-10-CM | POA: Insufficient documentation

## 2019-08-27 DIAGNOSIS — D219 Benign neoplasm of connective and other soft tissue, unspecified: Secondary | ICD-10-CM

## 2019-08-27 DIAGNOSIS — C221 Intrahepatic bile duct carcinoma: Secondary | ICD-10-CM

## 2019-08-27 DIAGNOSIS — C787 Secondary malignant neoplasm of liver and intrahepatic bile duct: Secondary | ICD-10-CM

## 2019-08-27 DIAGNOSIS — D259 Leiomyoma of uterus, unspecified: Secondary | ICD-10-CM | POA: Insufficient documentation

## 2019-08-27 DIAGNOSIS — R918 Other nonspecific abnormal finding of lung field: Secondary | ICD-10-CM | POA: Diagnosis not present

## 2019-08-27 LAB — CMP (CANCER CENTER ONLY)
ALT: 11 U/L (ref 0–44)
AST: 19 U/L (ref 15–41)
Albumin: 2.9 g/dL — ABNORMAL LOW (ref 3.5–5.0)
Alkaline Phosphatase: 136 U/L — ABNORMAL HIGH (ref 38–126)
Anion gap: 9 (ref 5–15)
BUN: 8 mg/dL (ref 6–20)
CO2: 23 mmol/L (ref 22–32)
Calcium: 9.7 mg/dL (ref 8.9–10.3)
Chloride: 107 mmol/L (ref 98–111)
Creatinine: 0.93 mg/dL (ref 0.44–1.00)
GFR, Est AFR Am: >60 mL/min (ref >60)
GFR, Estimated: >60 mL/min (ref >60)
Glucose, Bld: 147 mg/dL — ABNORMAL HIGH (ref 70–99)
Potassium: 3.5 mmol/L (ref 3.5–5.1)
Sodium: 139 mmol/L (ref 135–145)
Total Bilirubin: 0.9 mg/dL (ref 0.3–1.2)
Total Protein: 7.5 g/dL (ref 6.5–8.1)

## 2019-08-27 LAB — CBC WITH DIFFERENTIAL (CANCER CENTER ONLY)
Abs Immature Granulocytes: 0.02 10*3/uL (ref 0.00–0.07)
Basophils Absolute: 0 10*3/uL (ref 0.0–0.1)
Basophils Relative: 1 %
Eosinophils Absolute: 0 10*3/uL (ref 0.0–0.5)
Eosinophils Relative: 1 %
HCT: 41.4 % (ref 36.0–46.0)
Hemoglobin: 12.6 g/dL (ref 12.0–15.0)
Immature Granulocytes: 0 %
Lymphocytes Relative: 20 %
Lymphs Abs: 1.7 10*3/uL (ref 0.7–4.0)
MCH: 24.6 pg — ABNORMAL LOW (ref 26.0–34.0)
MCHC: 30.4 g/dL (ref 30.0–36.0)
MCV: 80.7 fL (ref 80.0–100.0)
Monocytes Absolute: 0.7 10*3/uL (ref 0.1–1.0)
Monocytes Relative: 9 %
Neutro Abs: 5.7 10*3/uL (ref 1.7–7.7)
Neutrophils Relative %: 69 %
Platelet Count: 229 10*3/uL (ref 150–400)
RBC: 5.13 MIL/uL — ABNORMAL HIGH (ref 3.87–5.11)
RDW: 14.6 % (ref 11.5–15.5)
WBC Count: 8.1 10*3/uL (ref 4.0–10.5)
nRBC: 0 % (ref 0.0–0.2)

## 2019-08-27 LAB — LACTATE DEHYDROGENASE: LDH: 529 U/L — ABNORMAL HIGH (ref 98–192)

## 2019-08-27 LAB — PROTIME-INR
INR: 1.2 (ref 0.8–1.2)
Prothrombin Time: 15.1 seconds (ref 11.4–15.2)

## 2019-08-27 NOTE — Progress Notes (Signed)
GYNECOLOGIC ONCOLOGY NEW PATIENT CONSULTATION   Patient Name: Melissa Hurley  Patient Age: 54 y.o. Date of Service: 08/27/19 Referring Provider: Milus Banister, MD [address],  [place] [address]   Primary Care Provider: Patient, No Pcp Per Consulting Provider: Jeral Pinch, MD   Assessment/Plan:  54 year old with metastatic adenocarcinoma of unknown primary origin.  Given the patient's recent biopsy results, I was asked by Dr. Lorenso Courier to see the patient to help rule out a gynecologic origin of her metastatic cancer.  Her recent liver biopsy showed adenocarcinoma with a nonspecific immunoprofile.  On recent CT scan, the patient has a markedly enlarged uterus with multiple fibroids, with evidence of calcification and degeneration.  On review of her records within epic, she has multiple OB ultrasounds from 2007 that show a multifibroid uterus at that time.  The largest fibroid measured almost 8 x 8 cm then.  The patient did not have any imaging following her delivery and thus the amount of growth of her multiple fibroids over the last 12 years is unknown.  In the setting of a longstanding history of known fibroids, we discussed that I think it is unlikely that her uterus is the source of her adenocarcinoma.  Given the appearance of her uterus on imaging as well as her 50-monthhistory of vaginal discharge, I recommended endometrial biopsy in clinic today to sample her endometrial lining.  It is likely based on the scant amount of tissue that I obtained on biopsy that her biopsy will result with benign findings or insufficient tissue.  We discussed that the next step at this time would then be to pursue a percutaneous biopsy of one of her large fundal fibroids with interventional radiology.  I will call her with with the results of her endometrial biopsy and discuss further biopsy needed at that time pending the results.  Given the patient's report of having had genetic testing, we will  reach out to her OB/GYN, Dr. DCharlesetta Garibaldi to see if we can obtain the report from this testing.  A copy of this note was sent to the patient's referring provider.   50 minutes of total time was spent for this patient encounter, including preparation, face-to-face counseling with the patient and coordination of care, and documentation of the encounter.  KJeral Pinch MD  Division of Gynecologic Oncology  Department of Obstetrics and Gynecology  [facility] ___________________________________________  Chief Complaint: Chief Complaint  Patient presents with  . Adenocarcinoma determined by biopsy of liver ([place]  . Fibroids    History of Present Illness:  Melissa HOCKENBERRYis a 54y.o. y.o. female who is seen in consultation at the request of JMilus Banister MD for an evaluation of uterine fibroids in the setting of adenocarcinoma of unknown primary, currently undergoing work-up.  The patient reports a known history of uterine fibroids.  She went through menopause at the age of 89  She denies any further vaginal bleeding since that time.  For the last 2 months or so she has had some slight intermittent vaginal discharge with a mild odor.  Sometimes this occurs for a week at a time sometimes once a month.  Her OB history is notable for 1 delivery by C-section and one early miscarriage.  She used oral contraceptive pills for approximately 1 year.  She denies any use of HRT after menopause.  Her last Pap smear was in 2018 and she denies history of abnormal Pap smears.  Her  OB/GYN is Dr. Charlesetta Garibaldi.  In September, the patient noted beginning to feel upper abdominal bloating, constipation, reflux and intermittent low back pain.  She initially thought the symptoms were related to reflux but as they became more frequent and worsened, she called low Exie Parody to see a gastroenterologist.  Currently, she endorses intermittent nausea throughout the day that is improved if she eats  something.  She overall has a significantly decreased appetite as well as early satiety.  She denies any emesis.  She endorses low back pain as well as pelvic pressure before having a bowel movement.  She notes having fairly significant fatigue.  She continues to have constipation although has regular bowel function with the use of daily MiraLAX.  She denies any urinary symptoms.  She reports worsening shortness of breath.  After being seen by GI at the beginning of December, the patient underwent CT imaging with findings described below.  CT showed a pericardial effusion and the patient was referred to cardiology.  She underwent transthoracic echocardiogram on 12/15.  Echo confirmed moderate pericardial effusion with intermediate cardiac risk for low risk procedure.  The patient thus underwent endoscopy and colonoscopy on 12/16.  Endoscopy was notable for multiple lesions within the entire esophagus consistent with Candida infection and a small hiatal hernia.  Colonoscopy was unremarkable.  The patient then saw Dr. Lorenso Courier on 12/18 with plan for percutaneous biopsy as well as obtaining her markers.  On 12/29, the patient underwent core biopsy of her liver mass with pathology revealing adenocarcinoma with a nonspecific immunoprofile.  The patient has a family history with multiple family members having been diagnosed with breast and/or ovarian cancer.  She does not think that any of them have had genetic testing.  She reports undergoing a 29 gene panel genetic test this summer with her OB/GYN that was negative for any mutations including in BRCA 1/2.  PAST MEDICAL HISTORY:  Past Medical History:  Diagnosis Date  . Cancer ([place])    Liver  . Uterine fibroid      PAST SURGICAL HISTORY:  Past Surgical History:  Procedure Laterality Date  . CESAREAN SECTION      OB/GYN HISTORY:  OB History  Gravida Para Term Preterm AB Living  _0 SAB TAB Ectopic Multiple Live Births  1            #  Outcome Date GA Lbr Len/2nd Weight Sex Delivery Anes PTL Lv  2 SAB           1 Para             Patient's last menstrual period was 10/25/2014.  Age at menarche: 25  Age at menopause: 71 Hx of HRT: Denies Hx of STDs: Denies Last pap: July 2018, negative History of abnormal pap smears: Denies  SCREENING STUDIES:  Last mammogram: Mid 2020  Last colonoscopy: December 2020  MEDICATIONS: No outpatient encounter medications on file as of 08/27/2019.   Facility-Administered Encounter Medications as of 08/27/2019  Medication  . ondansetron (ZOFRAN) 4 mg in sodium chloride 0.9 % 50 mL IVPB    ALLERGIES:  No Known Allergies   FAMILY HISTORY:  Family History  Problem Relation Age of Onset  . Breast cancer Mother        18's  . Ovarian cancer Mother   . Lung cancer Mother        58s  . Ovarian cancer Maternal Aunt   . Breast cancer Maternal  Grandmother   . Breast cancer Cousin        maternal, dx50+  . Colon cancer Neg Hx   . Esophageal cancer Neg Hx   . Rectal cancer Neg Hx   . Stomach cancer Neg Hx      SOCIAL HISTORY:    Social Connections:   . Frequency of Communication with Friends and Family: Not on file  . Frequency of Social Gatherings with Friends and Family: Not on file  . Attends Religious Services: Not on file  . Active Member of Clubs or Organizations: Not on file  . Attends Archivist Meetings: Not on file  . Marital Status: Not on file    REVIEW OF SYSTEMS:  Positives: Decreased appetite, fatigue, nausea, constipation, vaginal discharge, back pain Denies fevers, chills, unexplained weight changes. Denies hearing loss, neck lumps or masses, mouth sores, ringing in ears or voice changes. Denies cough or wheezing.  Denies shortness of breath. Denies chest pain or palpitations. Denies leg swelling. Denies abdominal distention, pain, blood in stools, diarrhea, vomiting Denies pain with intercourse, dysuria, frequency, hematuria or  incontinence. Denies hot flashes, pelvic pain, vaginal bleeding.   Denies joint pain or muscle pain/cramps. Denies itching, rash, or wounds. Denies dizziness, headaches, numbness or seizures. Denies swollen lymph nodes or glands, denies easy bruising or bleeding. Denies anxiety, depression, confusion, or decreased concentration.   Physical Exam:  Vital Signs for this encounter:  Blood pressure (!) 131/102, pulse (!) 119, temperature 98 F (36.7 C), temperature source Temporal, resp. rate 20, height _0  (1.702 m), weight 225 lb (102.1 kg), last menstrual period 10/25/2014, SpO2 100 %. Body mass index is 35.24 kg/m. General: Alert, oriented, no acute distress.  HEENT: Normocephalic, atraumatic. Sclera anicteric.  Chest: Clear to auscultation bilaterally.  No wheezes rhonchi or rales.  Patient appears mildly short of breath while talking during the exam. Cardiovascular: Regular rate and rhythm, no murmurs, rubs, or gallops.  Abdomen: Obese. Normoactive bowel sounds. Soft, nondistended, nontender to palpation. No masses appreciated. No palpable fluid wave.  Extremities: Grossly normal range of motion. Warm, well perfused. Trace edema bilaterally to ankles.  Skin: No rashes or lesions.  Lymphatics: No cervical, supraclavicular, or inguinal adenopathy.  GU:  Normal external female genitalia. No lesions. No discharge or bleeding.             Bladder/urethra:  No lesions or masses, well supported bladder             Vagina: Mildly atrophic vaginal mucosa.  No lesions.             Cervix: Normal appearing, no lesions.             Uterus: Enlarged, minimally mobile, uterus appreciated midway between the pubic symphysis and umbilicus, more on the left than the right.             Adnexa: no masses.  Rectal: Fullness suspected to be the uterus versus posterior fibroid in the cul-de-sac, no nodularity.  Endometrial Biopsy  The procedure was explained, including the risks and alternatives, the  patient gave verbal consent.  The speculum was placed and the cervix was prepped with Betadine.  The endometrial biopsy was performed with 2 passes of the endometrial biopsy pipelle.  Scant tissue was obtained. The uterus sounded to just over 10 cm.  The patient tolerated procedure.  Some bleeding was noted at the tenaculum site after removing the tenaculum from the anterior lip of the cervix.  Silver nitrate  and pressure were used to achieve hemostasis.  LABORATORY AND RADIOLOGIC DATA:  Outside medical records were reviewed to synthesize the above history, along with the history and physical obtained during the visit.   CBC    Component Value Date/Time   WBC 8.1 08/27/2019 0905   WBC 7.5 08/17/2019 1130   RBC 5.13 (H) 08/27/2019 0905   HGB 12.6 08/27/2019 0905   HCT 41.4 08/27/2019 0905   PLT 229 08/27/2019 0905   MCV 80.7 08/27/2019 0905   MCH 24.6 (L) 08/27/2019 0905   MCHC 30.4 08/27/2019 0905   RDW 14.6 08/27/2019 0905   LYMPHSABS 1.7 08/27/2019 0905   MONOABS 0.7 08/27/2019 0905   EOSABS 0.0 08/27/2019 0905   BASOSABS 0.0 08/27/2019 0905   CMP Latest Ref Rng & Units 08/27/2019 08/06/2019 07/23/2019  Glucose 70 - 99 mg/dL 147(H) 120(H) 110(H)  BUN 6 - 20 mg/dL _0 Creatinine 0.44 - 1.00 mg/dL 0.93 0.85 0.92  Sodium 135 - 145 mmol/L 139 139 139  Potassium 3.5 - 5.1 mmol/L 3.5 3.6 3.7  Chloride 98 - 111 mmol/L 107 107 107  CO2 22 - 32 mmol/L 23 21(L) 23  Calcium 8.9 - 10.3 mg/dL 9.7 9.5 9.9  Total Protein 6.5 - 8.1 g/dL 7.5 7.5 7.3  Total Bilirubin 0.3 - 1.2 mg/dL 0.9 0.6 0.6  Alkaline Phos 38 - 126 U/L 136(H) 140(H) 117  AST 15 - 41 U/L _1 ALT 0 - 44 U/L _2 CA-125: pending LDH: 529 CA 19-9: < 2 AFP: 18 CEA: < 1  Liver, needle core biopsy on 12/29: Adenocarcinoma Immunohistochemistry is positive for CK7 and PAX-8.  CK20, CDX-2, TTF-1-1, ER, and GA TA-3 -.  Immunoprofile somewhat nonspecific and differential includes pancreaticobiliary, upper GI,  breast, gynecologic, etc.  CT chest 12/14: 1. Moderate-sized pericardial effusion with an interval increase in size. 2. Previously demonstrated small hiatal hernia with eccentric wall thickening. The wall thickening is on the right and has lobulated contours, suggesting the possibility of a primary malignancy. Further evaluation with endoscopy and biopsy is recommended. 3. Small number of bilateral subcentimeter lung nodules and a small calcified granuloma in the right lower lobe. The noncalcified nodules are suspicious for metastases. Noncalcified granulomata are also a possibility. 4. Previously demonstrated large right lobe liver mass and poorly defined additional masses, suspicious for metastases. 5. Aortic atherosclerosis.  CT A/P 12/11: 1. Multiple hypovascular liver masses, largest measuring 12.5 cm. These are suspicious for hepatic metastases, with multifocal hepatocellular carcinoma considered less likely. Suggest correlation with tumor markers. 2. Mild abdominal lymphadenopathy, consistent with metastatic disease. 3. Sub-cm pulmonary nodules in both lung bases, highly suspicious for pulmonary metastases. 4. Mild eccentric wall thickening at the GE junction, which could represent a small esophageal carcinoma. Consider upper endoscopy for further evaluation. 5. Large uterine fibroids.

## 2019-08-27 NOTE — Patient Instructions (Signed)
It was a pleasure meeting you today.  I will call you with the results of your endometrial biopsy once we have them, likely early next week.  If they show no cancer or there is not enough tissue for diagnosis, then I will recommend biopsy with the radiologist of one of the uterine fibroids.  I will help get this set up as soon as possible if we need to pursue additional biopsying.  If you have any questions or concerns before we talk next, please do not hesitate to call our clinic at [phone].

## 2019-08-27 NOTE — Telephone Encounter (Signed)
LM for medical records to fax results for genetic testing done in summer of 2020.

## 2019-08-27 NOTE — Progress Notes (Signed)
[facility] [phone]   [phone]  PROGRESS NOTE  Patient Care Team: Patient, No Pcp Per as PCP - General (General Practice)  Hematological/Oncological History  # Metastatic Disease of Unknown Primary, Workup Underway 1) 07/30/2019: CT scan performed for generalized abdominal pain and bloating. Findings showed multiple hypervascular masses, abdominal lymphadenopathy, lung nodules, and thickening of the GE junction.  2) 08/04/2019: EGD showed candida infection of the esophagus, small hiatal hernia, with no other concerning findings. Colonoscopy performed with no malignancy or abnormalities noted. 3) 08/06/2019: establish care with Dr.   4) 08/17/2019: US guided biopsy of the liver lesion, confirmed adenocarcinoma without clearly denoting the origin. Pancreaticobiliary vs GYN origin.  5) 08/26/2019: underwent endometrial biopsy with Ob/Gyn   Interval History:  Melissa Hurley 54 y.o. female with medical history significant for metastatic adenocarcinoma of unclear origin who presents for a follow up visit. In the interim as her last visit she has undergone biopsy of the liver lesion.  The report has confirmed adenocarcinoma, however it was unable to provide the tissue of origin.  Differential is pancreaticobiliary versus GYN.  On examination today Ms. Fife notes that she continues to have some ache in her back and her pelvis.  She notes that she has lost approximately 5 pounds and that she is not eating as much and there her appetite is not strong.  She notes some fatigue with continue duration of walking, but is otherwise at her baseline level of health.  She currently denies any fevers, chills, sweats, nausea, vomiting or diarrhea.  She is not experiencing any GYN bleeding.  She tolerated the liver biopsy well without any complication.  A 10 point ROS was listed below.  Today we discussed that this is metastatic adenocarcinoma and therefore is  incurable.  We discussed that treatment of this type of malignancy would therefore be palliative in nature and would be aimed at reducing the size of the tumor and extending life, though it would not definitively "cure the cancer".  The patient voiced her understanding of these findings moving forward.  MEDICAL HISTORY:  Past Medical History:  Diagnosis Date  . Cancer (HCC)    Liver  . Uterine fibroid     SURGICAL HISTORY: Past Surgical History:  Procedure Laterality Date  . CESAREAN SECTION      SOCIAL HISTORY: Social History   Socioeconomic History  . Marital status: Married    Spouse name: Not on file  . Number of children: Not on file  . Years of education: Not on file  . Highest education level: Not on file  Occupational History  . Occupation: Teacher  Tobacco Use  . Smoking status: Never Smoker  . Smokeless tobacco: Never Used  Substance and Sexual Activity  . Alcohol use: Never  . Drug use: Never  . Sexual activity: Not Currently  Other Topics Concern  . Not on file  Social History Narrative  . Not on file   Social Determinants of Health   Financial Resource Strain:   . Difficulty of Paying Living Expenses: Not on file  Food Insecurity:   . Worried About Running Out of Food in the Last Year: Not on file  . Ran Out of Food in the Last Year: Not on file  Transportation Needs:   . Lack of Transportation (Medical): Not on file  . Lack of Transportation (Non-Medical): Not on file  Physical Activity:   . Days of Exercise per Week: Not on file  . Minutes   of Exercise per Session: Not on file  Stress:   . Feeling of Stress : Not on file  Social Connections:   . Frequency of Communication with Friends and Family: Not on file  . Frequency of Social Gatherings with Friends and Family: Not on file  . Attends Religious Services: Not on file  . Active Member of Clubs or Organizations: Not on file  . Attends Club or Organization Meetings: Not on file  . Marital  Status: Not on file  Intimate Partner Violence:   . Fear of Current or Ex-Partner: Not on file  . Emotionally Abused: Not on file  . Physically Abused: Not on file  . Sexually Abused: Not on file    FAMILY HISTORY: Family History  Problem Relation Age of Onset  . Breast cancer Mother        50's  . Ovarian cancer Mother   . Lung cancer Mother        70s  . Ovarian cancer Maternal Aunt   . Breast cancer Maternal Grandmother   . Breast cancer Cousin        maternal, dx50+  . Colon cancer Neg Hx   . Esophageal cancer Neg Hx   . Rectal cancer Neg Hx   . Stomach cancer Neg Hx     ALLERGIES:  has No Known Allergies.  MEDICATIONS:  No current outpatient medications on file.   Current Facility-Administered Medications  Medication Dose Route Frequency Provider Last Rate Last Admin  . ondansetron (ZOFRAN) 4 mg in sodium chloride 0.9 % 50 mL IVPB  4 mg Intravenous Once Jacobs, Daniel P, MD        REVIEW OF SYSTEMS:   Constitutional: ( - ) fevers, ( - )  chills , ( - ) night sweats Eyes: ( - ) blurriness of vision, ( - ) double vision, ( - ) watery eyes Ears, nose, mouth, throat, and face: ( - ) mucositis, ( - ) sore throat Respiratory: ( - ) cough, ( - ) dyspnea, ( - ) wheezes Cardiovascular: ( - ) palpitation, ( - ) chest discomfort, ( - ) lower extremity swelling Gastrointestinal:  ( - ) nausea, ( - ) heartburn, ( - ) change in bowel habits Skin: ( - ) abnormal skin rashes Lymphatics: ( - ) new lymphadenopathy, ( - ) easy bruising Neurological: ( - ) numbness, ( - ) tingling, ( - ) new weaknesses Behavioral/Psych: ( - ) mood change, ( - ) new changes  All other systems were reviewed with the patient and are negative.  PHYSICAL EXAMINATION: ECOG PERFORMANCE STATUS: 0 - Asymptomatic  Vitals:   08/27/19 0809  BP: (!) 132/92  Pulse: (!) 110  Resp: 16  Temp: 98.2 F (36.8 C)  SpO2: 100%   Filed Weights   08/27/19 0809  Weight: 227 lb 11.2 oz (103.3 kg)    GENERAL:  well appearing middle aged female in NAD  SKIN: skin color, texture, turgor are normal, no rashes or significant lesions EYES: conjunctiva are pink and non-injected, sclera clear LUNGS: clear to auscultation and percussion with normal breathing effort HEART: regular rate & rhythm and no murmurs and no lower extremity edema ABDOMEN: soft, non-tender, non-distended, normal bowel sounds Musculoskeletal: no cyanosis of digits and no clubbing  PSYCH: alert & oriented x 3, fluent speech NEURO: no focal motor/sensory deficits  LABORATORY DATA:  I have reviewed the data as listed Recent Results (from the past 2160 hour(s))  Sed Rate (ESR)       Status: Abnormal   Collection Time: 07/23/19 11:16 AM  Result Value Ref Range   Sed Rate >100 Repeated and verified X2. (H) 0 - 30 mm/hr  TSH     Status: None   Collection Time: 07/23/19 11:16 AM  Result Value Ref Range   TSH 2.72 0.35 - 4.50 uIU/mL  Comp Met (CMET)     Status: Abnormal   Collection Time: 07/23/19 11:16 AM  Result Value Ref Range   Sodium 139 135 - 145 mEq/L   Potassium 3.7 3.5 - 5.1 mEq/L   Chloride 107 96 - 112 mEq/L   CO2 23 19 - 32 mEq/L   Glucose, Bld 110 (H) 70 - 99 mg/dL   BUN 14 6 - 23 mg/dL   Creatinine, Ser 0.92 0.40 - 1.20 mg/dL   Total Bilirubin 0.6 0.2 - 1.2 mg/dL   Alkaline Phosphatase 117 39 - 117 U/L   AST 19 0 - 37 U/L   ALT 12 0 - 35 U/L   Total Protein 7.3 6.0 - 8.3 g/dL   Albumin 3.5 3.5 - 5.2 g/dL   GFR 77.52 >60.00 mL/min   Calcium 9.9 8.4 - 10.5 mg/dL  CBC w/Diff     Status: Abnormal   Collection Time: 07/23/19 11:16 AM  Result Value Ref Range   WBC 6.6 4.0 - 10.5 K/uL   RBC 5.28 (H) 3.87 - 5.11 Mil/uL   Hemoglobin 13.1 12.0 - 15.0 g/dL   HCT 41.2 36.0 - 46.0 %   MCV 78.0 78.0 - 100.0 fl   MCHC 31.7 30.0 - 36.0 g/dL   RDW 14.7 11.5 - 15.5 %   Platelets 268.0 150.0 - 400.0 K/uL   Neutrophils Relative % 71.0 43.0 - 77.0 %   Lymphocytes Relative 19.9 12.0 - 46.0 %   Monocytes Relative 7.7 3.0 -  12.0 %   Eosinophils Relative 1.0 0.0 - 5.0 %   Basophils Relative 0.4 0.0 - 3.0 %   Neutro Abs 4.7 1.4 - 7.7 K/uL   Lymphs Abs 1.3 0.7 - 4.0 K/uL   Monocytes Absolute 0.5 0.1 - 1.0 K/uL   Eosinophils Absolute 0.1 0.0 - 0.7 K/uL   Basophils Absolute 0.0 0.0 - 0.1 K/uL  ANA     Status: None   Collection Time: 07/27/19  4:17 PM  Result Value Ref Range   Anti Nuclear Antibody (ANA) NEGATIVE NEGATIVE    Comment: ANA IFA is a first line screen for detecting the presence of up to approximately 150 autoantibodies in various autoimmune diseases. A negative ANA IFA result suggests an ANA-associated autoimmune disease is not present at this time, but is not definitive. If there is high clinical suspicion for Sjogren's syndrome, testing for anti-SS-A/Ro antibody should be considered. Anti-Jo-1 antibody should be considered for clinically suspected inflammatory myopathies. . AC-0: Negative . International Consensus on ANA Patterns (https://doi.org/10.1515/cclm-2018-0052) . For additional information, please refer to http://education.[employer].com/faq/FAQ177 (This link is being provided for informational/ educational purposes only.) .   Angiotensin converting enzyme     Status: None   Collection Time: 07/27/19  4:17 PM  Result Value Ref Range   Angiotensin-Converting Enzyme 26 9 - 67 U/L  SARS Coronavirus 2 (TAT 6-24 hrs)     Status: None   Collection Time: 08/02/19 12:00 AM  Result Value Ref Range   SARS Coronavirus 2 RESULT:  NEGATIVE     Comment: RESULT:  NEGATIVESARS-CoV-2 INTERPRETATION:A NEGATIVE  test result means that SARS-CoV-2 RNA was not present in the specimen above the   limit of detection of this test. This does not preclude a possible SARS-CoV-2 infection and should not be used as the  sole basis for patient management decisions. Negative results must be combined with clinical observations, patient history, and epidemiological information. Optimum specimen types and  timing for peak viral levels during infections caused by SARS-CoV-2  have not been determined. Collection of multiple specimens or types of specimens may be necessary to detect virus. Improper specimen collection and handling, sequence variability under primers/probes, or organism present below the limit of detection may  lead to false negative results. Positive and negative predictive values of testing are highly dependent on prevalence. False negative test results are more likely when prevalence of disease is high.The expected result is NEGATIVE.Fact  Sheet for  Healthcare Providers: https://www.woods-mathews.com/.Fact Sheet for Patients: SugarRoll.be.Normal Reference Range - Negative   ECHOCARDIOGRAM COMPLETE     Status: None   Collection Time: 08/03/19 12:51 PM  Result Value Ref Range   Weight 3,664 oz   Height 67 in   BP 120/88 mmHg  CEA (IN HOUSE-[facility])     Status: None   Collection Time: 08/06/19  2:06 PM  Result Value Ref Range   CEA ([facility]-In House) <1.00 0.00 - 5.00 ng/mL    Comment: (NOTE) This test was performed using [employer]'s Chemiluminescent Microparticle Immunoassay. Values obtained from different assay methods cannot be used interchangeably. Please note that 5-10% of patients who smoke may see CEA levels up to 6.9 ng/mL. Performed at [facility], [place] [address], [place], [place] [address]   AFP tumor marker     Status: Abnormal   Collection Time: 08/06/19  2:06 PM  Result Value Ref Range   AFP, Serum, Tumor Marker 18.0 (H) 0.0 - 8.3 ng/mL    Comment: (NOTE) [employer]ostics Electrochemiluminescence Immunoassay (ECLIA) Values obtained with different assay methods or kits cannot be used interchangeably.  Results cannot be interpreted as absolute evidence of the presence or absence of malignant disease. This test is not interpretable in pregnant females. Performed At: [facility] [place], [place] [id number] Rush Farmer MD AG:[id number]   CA 19.9     Status: None   Collection Time: 08/06/19  2:06 PM  Result Value Ref Range   CA 19-9 <2 0 - 35 U/mL    Comment: (NOTE) Roche Diagnostics Electrochemiluminescence Immunoassay (ECLIA) Values obtained with different assay methods or kits cannot be used interchangeably.  Results cannot be interpreted as absolute evidence of the presence or absence of malignant disease. Performed At:  [facility] [place], [place] [id number] Rush Farmer MD BC:[id number]   Hepatitis C antibody     Status: None   Collection Time: 08/06/19  2:06 PM  Result Value Ref Range   HCV Ab NON REACTIVE NON REACTIVE    Comment: (NOTE) Nonreactive HCV antibody screen is consistent with no HCV infections,  unless recent infection is suspected or other evidence exists to indicate HCV infection. Performed at [facility], [place] [address], [place], [place] [address]   Hepatitis B core antibody, total     Status: None   Collection Time: 08/06/19  2:06 PM  Result Value Ref Range   Hep B Core Total Ab NON REACTIVE NON REACTIVE    Comment: Performed at [place] [address], [place], [place] [address]  Hepatitis B surface antibody     Status: None   Collection Time: 08/06/19  2:06 PM  Result Value  Ref Range   Hep B S Ab NON REACTIVE NON REACTIVE    Comment: (NOTE) Inconsistent with immunity, less than 10 mIU/mL. Performed at [facility], [address] [place], [place] [address]   CMP (Cancer Center only)     Status: Abnormal   Collection Time: 08/06/19  2:06 PM  Result Value Ref Range   Sodium 139 135 - 145 mmol/L   Potassium 3.6 3.5 - 5.1 mmol/L   Chloride 107 98 - 111 mmol/L   CO2 21 (L) 22 - 32 mmol/L   Glucose, Bld 120 (H) 70 - 99 mg/dL   BUN 7 6 - 20 mg/dL   Creatinine 0.85 0.44 - 1.00 mg/dL   Calcium 9.5 8.9 - 10.3 mg/dL   Total Protein 7.5 6.5 - 8.1 g/dL   Albumin 3.0 (L)  3.5 - 5.0 g/dL   AST 30 15 - 41 U/L   ALT 19 0 - 44 U/L   Alkaline Phosphatase 140 (H) 38 - 126 U/L   Total Bilirubin 0.6 0.3 - 1.2 mg/dL   GFR, Est Non Af Am >60 >60 mL/min   GFR, Est AFR Am >60 >60 mL/min   Anion gap 11 5 - 15    Comment: Performed at [facility], [address] [place], [place] [address]  CBC with Differential (Cancer Center Only)     Status: Abnormal   Collection Time: 08/06/19  2:06 PM  Result Value Ref Range   WBC Count 8.0 4.0 - 10.5 K/uL   RBC 5.21 (H) 3.87 - 5.11 MIL/uL   Hemoglobin 12.7 12.0 - 15.0 g/dL   HCT 41.7 36.0 - 46.0 %   MCV 80.0 80.0 - 100.0 fL   MCH 24.4 (L) 26.0 - 34.0 pg   MCHC 30.5 30.0 - 36.0 g/dL   RDW 14.4 11.5 - 15.5 %   Platelet Count 264 150 - 400 K/uL   nRBC 0.0 0.0 - 0.2 %   Neutrophils Relative % 69 %   Neutro Abs 5.5 1.7 - 7.7 K/uL   Lymphocytes Relative 22 %   Lymphs Abs 1.7 0.7 - 4.0 K/uL   Monocytes Relative 8 %   Monocytes Absolute 0.6 0.1 - 1.0 K/uL   Eosinophils Relative 1 %   Eosinophils Absolute 0.1 0.0 - 0.5 K/uL   Basophils Relative 0 %   Basophils Absolute 0.0 0.0 - 0.1 K/uL   Immature Granulocytes 0 %   Abs Immature Granulocytes 0.03 0.00 - 0.07 K/uL    Comment: Performed at [facility], [address] [place], [place] [address]  Hepatitis B surface antigen     Status: None   Collection Time: 08/06/19  2:07 PM  Result Value Ref Range   Hepatitis B Surface Ag NON REACTIVE NON REACTIVE    Comment: Performed at [facility], [address] [place], [place] [address]  Protime-INR     Status: None   Collection Time: 08/06/19  2:07 PM  Result Value Ref Range   Prothrombin Time 14.0 11.4 - 15.2 seconds   INR 1.1 0.8 - 1.2    Comment: (NOTE) INR goal varies based on device and disease states. Performed at [facility], [address] [place], [place] [address]   Lactate dehydrogenase (LDH)     Status: Abnormal   Collection Time:  08/06/19  2:08 PM  Result Value Ref Range   LDH 450 (H) 98 - 192 U/L      Comment: Performed at [facility], [place] [address] [place], [place] [address]  APTT upon arrival     Status: None   Collection Time: 08/17/19 11:30 AM  Result Value Ref Range   aPTT 32 24 - 36 seconds    Comment: Performed at [place] [address], [place], [place] [address]  CBC upon arrival     Status: Abnormal   Collection Time: 08/17/19 11:30 AM  Result Value Ref Range   WBC 7.5 4.0 - 10.5 K/uL   RBC 5.41 (H) 3.87 - 5.11 MIL/uL   Hemoglobin 13.2 12.0 - 15.0 g/dL   HCT 44.6 36.0 - 46.0 %   MCV 82.4 80.0 - 100.0 fL   MCH 24.4 (L) 26.0 - 34.0 pg   MCHC 29.6 (L) 30.0 - 36.0 g/dL   RDW 14.8 11.5 - 15.5 %   Platelets 278 150 - 400 K/uL   nRBC 0.0 0.0 - 0.2 %    Comment: Performed at [facility], [place] [address] [place], [place] [address]  Protime-INR upon arrival     Status: None   Collection Time: 08/17/19 11:30 AM  Result Value Ref Range   Prothrombin Time 14.0 11.4 - 15.2 seconds   INR 1.1 0.8 - 1.2    Comment: (NOTE) INR goal varies based on device and disease states. Performed at [facility], [place] [address] [place], [place] [address]   Surgical pathology     Status: None   Collection Time: 08/17/19  2:18 PM  Result Value Ref Range   SURGICAL PATHOLOGY      SURGICAL PATHOLOGY CASE: [id number] PATIENT: Ayslin Tatsch Surgical Pathology Report     Clinical History: no known primary or history of cirrhosis, now with large infiltrative mass within the liver worrisome for metastatic disease, post US guided bx (cm)     FINAL MICROSCOPIC DIAGNOSIS:  A. LIVER, NEEDLE CORE BIOPSY: - Adenocarcinoma, see comment.  COMMENT:  The cores are mostly necrotic with only focal viable malignant glands. Immunohistochemistry is positive for cytokeratin 7 and PAX-8 (weak). Cytokeratin 20, CDX-2, TTF-1, ER, and GATA-3 are negative. The immunoprofile  is somewhat non-specific and the differential includes a pancreaticobiliary, upper gastrointestinal, breast, gynecologic, etc. In the absence of a primary this could be compatible with pancreaticobiliary. Dr. Vic Ripper has reviewed the case. The case was called to Dr. Ardis Hughs on 08/19/2019.   GROSS DESCRIPTION:  The specimen is received in formalin and consists of 5 cores of tan-whi te soft tissue, ranging from 1.6 to 2.7 cm in length by 0.1 cm in diameter.  The specimen is entirely submitted in 3 cassettes. Craig Staggers 08/19/2019)    Final Diagnosis performed by Vicente Males, MD.   Electronically signed 08/19/2019 Technical component performed at [facility]. [facility], [place] [address] [place], [place] [address].  Professional component performed at [facility], [place] [address] [place], [place] [address].  Immunohistochemistry Technical component (if applicable) was performed at [facility]. [address], [place], [place], [place] [address].   IMMUNOHISTOCHEMISTRY DISCLAIMER (if applicable): Some of these immunohistochemical stains may have been developed and the performance characteristics determine by [facility]. Some may not have been cleared or approved by the U.S. Food and Drug Administration. The FDA has determined that such clearance or approval is not necessary. This test is used for  clinical purposes. It should not be regarded as investigational or for research. This laboratory is certified  under the Clinical Laboratory Improvement Amendments of 1988 (CLIA-88) as qualified to perform high complexity clinical laboratory testing.  The controls stained appropriately.   Protime-INR     Status: None   Collection Time: 08/27/19  9:05 AM  Result Value Ref Range   Prothrombin Time 15.1 11.4 - 15.2 seconds   INR 1.2 0.8 - 1.2    Comment: (NOTE) INR goal varies based on device and disease states. Performed at [facility], [address] [place], [place] [address]   CA 125     Status: Abnormal   Collection Time: 08/27/19  9:05 AM  Result Value Ref Range   Cancer Antigen (CA) 125 52.0 (H) 0.0 - 38.1 U/mL    Comment: (NOTE) Roche Diagnostics Electrochemiluminescence Immunoassay (ECLIA) Values obtained with different assay methods or kits cannot be used interchangeably.  Results cannot be interpreted as absolute evidence of the presence or absence of malignant disease. Performed At: [facility] [place] [address] [place], [place] [id number] Nagendra Sanjai MD Ph:[phone]   Lactate dehydrogenase (LDH)     Status: Abnormal   Collection Time: 08/27/19  9:05 AM  Result Value Ref Range   LDH 529 (H) 98 - 192 U/L    Comment: Performed at [facility], [address] [place], [place] [address]  CMP (Cancer Center only)     Status: Abnormal   Collection Time: 08/27/19  9:05 AM  Result Value Ref Range   Sodium 139 135 - 145 mmol/L   Potassium 3.5 3.5 - 5.1 mmol/L   Chloride 107 98 - 111 mmol/L   CO2 23 22 - 32 mmol/L   Glucose, Bld 147 (H) 70 - 99 mg/dL   BUN 8 6 - 20 mg/dL   Creatinine 0.93 0.44 - 1.00 mg/dL   Calcium 9.7 8.9 - 10.3 mg/dL   Total Protein 7.5 6.5 - 8.1 g/dL   Albumin 2.9 (L) 3.5 - 5.0 g/dL   AST 19 15 - 41 U/L   ALT 11 0 - 44 U/L   Alkaline Phosphatase 136 (H) 38 - 126 U/L   Total Bilirubin 0.9 0.3 - 1.2 mg/dL   GFR, Est Non Af Am >60 >60 mL/min   GFR, Est AFR Am >60 >60 mL/min   Anion gap 9 5 - 15    Comment: Performed at [facility], [address] [place],  [address]  CBC with Differential (Cancer Center Only)     Status: Abnormal   Collection Time: 08/27/19  9:05 AM  Result Value Ref Range   WBC Count 8.1 4.0 - 10.5 K/uL   RBC 5.13 (H) 3.87 - 5.11 MIL/uL   Hemoglobin 12.6 12.0 - 15.0 g/dL   HCT 41.4 36.0 - 46.0 %   MCV 80.7 80.0 - 100.0 fL   MCH 24.6 (L) 26.0 - 34.0 pg   MCHC 30.4 30.0 -  36.0 g/dL   RDW 14.6 11.5 - 15.5 %   Platelet Count 229 150 - 400 K/uL   nRBC 0.0 0.0 - 0.2 %   Neutrophils Relative % 69 %   Neutro Abs 5.7 1.7 - 7.7 K/uL   Lymphocytes Relative 20 %   Lymphs Abs 1.7 0.7 - 4.0 K/uL   Monocytes Relative 9 %   Monocytes Absolute 0.7 0.1 - 1.0 K/uL   Eosinophils Relative 1 %   Eosinophils Absolute 0.0 0.0 - 0.5 K/uL   Basophils Relative 1 %   Basophils Absolute 0.0 0.0 -   0.1 K/uL   Immature Granulocytes 0 %   Abs Immature Granulocytes 0.02 0.00 - 0.07 K/uL    Comment: Performed at [facility], [address] [place], [place] [address]     RADIOGRAPHIC STUDIES: I have personally reviewed the radiological images as listed and agreed with the findings in the report: reviewed previously. Large malignant lesion of the liver with enlarged uterus with apparent fibroids.   CT CHEST W CONTRAST  Result Date: 08/02/2019 CLINICAL DATA:  Liver masses on a recent CT. Assessing for primary malignancy. Epigastric abdominal pain. Cough for the past 2 weeks. EXAM: CT CHEST WITH CONTRAST TECHNIQUE: Multidetector CT imaging of the chest was performed during intravenous contrast administration. CONTRAST:  80mL OMNIPAQUE IOHEXOL 300 MG/ML  SOLN COMPARISON:  Abdomen and pelvis CT dated 07/30/2019. FINDINGS: Cardiovascular: Moderate-sized pericardial effusion with an interval increase in size, measuring 14 mm in maximum thickness. Normal sized heart. Mild atheromatous aortic calcifications. Mediastinum/Nodes: Previously demonstrated small hiatal hernia with eccentric wall thickening. The wall thickening is on the right and has lobulated contours, measuring 13 mm in maximum thickness on image number 109 series 2. No enlarged lymph nodes. Lungs/Pleura: Small right pleural effusion. Small number of bilateral subcentimeter lung nodules there is also a small calcified granuloma in the right lower lobe. Upper Abdomen: Previously demonstrated large right lobe  liver mass and poorly defined additional masses. Musculoskeletal: No evidence of bony metastatic disease. Lower thoracic spine degenerative change. IMPRESSION: 1. Moderate-sized pericardial effusion with an interval increase in size. 2. Previously demonstrated small hiatal hernia with eccentric wall thickening. The wall thickening is on the right and has lobulated contours, suggesting the possibility of a primary malignancy. Further evaluation with endoscopy and biopsy is recommended. 3. Small number of bilateral subcentimeter lung nodules and a small calcified granuloma in the right lower lobe. The noncalcified nodules are suspicious for metastases. Noncalcified granulomata are also a possibility. 4. Previously demonstrated large right lobe liver mass and poorly defined additional masses, suspicious for metastases. 5. Aortic atherosclerosis. Aortic Atherosclerosis (ICD10-I70.0). Electronically Signed   By: Steven  Reid M.D.   On: 08/02/2019 14:32   US BIOPSY (LIVER)  Result Date: 08/17/2019 INDICATION: No known primary, now with infiltrative liver mass worrisome for metastatic disease. Please perform ultrasound-guided liver biopsy for tissue diagnostic purposes. EXAM: ULTRASOUND GUIDED LIVER LESION BIOPSY COMPARISON:  CT abdomen and pelvis-07/30/2019 MEDICATIONS: None ANESTHESIA/SEDATION: Fentanyl 100 mcg IV; Versed 2 mg IV Total Moderate Sedation time:  10 Minutes. The patient's level of consciousness and vital signs were monitored continuously by radiology nursing throughout the procedure under my direct supervision. COMPLICATIONS: None immediate. PROCEDURE: Informed written consent was obtained from the patient after a discussion of the risks, benefits and alternatives to treatment. The patient understands and consents the procedure. A timeout was performed prior to the initiation of the procedure. Ultrasound scanning was performed of the right upper abdominal quadrant demonstrates a large (at least 12.0 x  7.9 cm) hypoechoic mass replacing near the entirety the posterior segment of the right lobe of the liver (image 4) compatible the findings on preceding abdominal CT. The procedure was planned. The right upper abdominal quadrant was prepped and draped in the usual sterile fashion. The overlying soft tissues were anesthetized with 1% lidocaine with epinephrine. A 17 gauge, 6.8 cm co-axial needle was advanced into a peripheral aspect of the lesion. This was followed by 5 core biopsies with an 18 gauge core device under direct ultrasound guidance. The coaxial needle tract   was embolized with a small amount of Gel-Foam slurry and superficial hemostasis was obtained with manual compression. Post procedural scanning was negative for definitive area of hemorrhage or additional complication. A dressing was placed. The patient tolerated the procedure well without immediate post procedural complication. IMPRESSION: Technically successful ultrasound guided core needle biopsy of infiltrative mass nearly replacing the entirety of the posterior segment of the right lobe of the liver. Electronically Signed   By: Sandi Mariscal M.D.   On: 08/17/2019 14:32   ECHOCARDIOGRAM COMPLETE  Result Date: 08/03/2019   ECHOCARDIOGRAM REPORT   Patient Name:   Melissa Hurley Date of Exam: 08/03/2019 Medical Rec #:  [id number]       Height:       67.0 in Accession #:    [id number]      Weight:       229.0 lb Date of Birth:  10-13-1965      BSA:          2.14 m Patient Age:    54 years        BP:           120/88 mmHg Patient Gender: F               HR:           104 bpm. Exam Location:  [facility] Procedure: 2D Echo, Cardiac Doppler and Color Doppler STAT ECHO Indications:    I31.3 Pericardial effusion  History:        Patient has no prior history of Echocardiogram examinations.  Sonographer:    Jessee Avers, RDCS Referring Phys: [place]  1. There is a moderate circumferential pericardial effusion with prominent  pericardial fat pad. There is normal RV function. There is no evidence of RV diastolic collapse or variation of TV/MV inflow to suggest tamponade. The IVC is normal size and collapsable on the images provided. There is minimal movement of the RA wall in late systole, but this is a non-specific finding. Overall, there are no echocardiographic findings to suggest cardiac tamponade.  2. Left ventricular ejection fraction, by visual estimation, is 55 to 60%. The left ventricle has normal function. There is no left ventricular hypertrophy.  3. Indeterminate diastolic filling due to E-A fusion.  4. The left ventricle has no regional wall motion abnormalities.  5. Global right ventricle has normal systolic function.The right ventricular size is normal. No increase in right ventricular wall thickness.  6. Left atrial size was normal.  7. Right atrial size was normal.  8. Moderate pericardial effusion.  9. Presence of pericardial fat pad. 10. The pericardial effusion is circumferential. 11. Mild mitral annular calcification. 12. The mitral valve is degenerative. No evidence of mitral valve regurgitation. 13. The tricuspid valve is grossly normal. Tricuspid valve regurgitation is trivial. 14. The aortic valve is tricuspid. Aortic valve regurgitation is not visualized. No evidence of aortic valve sclerosis or stenosis. 15. The pulmonic valve was grossly normal. Pulmonic valve regurgitation is not visualized. 16. No prior Echocardiogram. FINDINGS  Left Ventricle: Left ventricular ejection fraction, by visual estimation, is 55 to 60%. The left ventricle has normal function. The left ventricle has no regional wall motion abnormalities. There is no left ventricular hypertrophy. Indeterminate diastolic filling due to E-A fusion. Right Ventricle: The right ventricular size is normal. No increase in right ventricular wall thickness. Global RV systolic function is has normal systolic function. Left Atrium: Left atrial size was normal  in size. Right  Atrium: Right atrial size was normal in size Pericardium: A moderately sized pericardial effusion is present. The pericardial effusion is circumferential. There is no evidence of cardiac tamponade. Presence of pericardial fat pad. There is a moderate circumferential pericardial effusion with prominent pericardial fat pad. There is normal RV function. There is no evidence of RV diastolic collapse or variation of TV/MV inflow to suggest tamponade. The IVC is normal size and collapsable on the images provided. There is minimal movement of the RA wall in late systole, but this is a non-specific finding. Overall, there are no echocardiographic findings to suggest cardiac tamponade. Mitral Valve: The mitral valve is degenerative in appearance. Mild mitral annular calcification. No evidence of mitral valve regurgitation. Tricuspid Valve: The tricuspid valve is grossly normal. Tricuspid valve regurgitation is trivial. Aortic Valve: The aortic valve is tricuspid. Aortic valve regurgitation is not visualized. The aortic valve is structurally normal, with no evidence of sclerosis or stenosis. Pulmonic Valve: The pulmonic valve was grossly normal. Pulmonic valve regurgitation is not visualized. Pulmonic regurgitation is not visualized. Aorta: The aortic root and ascending aorta are structurally normal, with no evidence of dilitation. IAS/Shunts: No atrial level shunt detected by color flow Doppler.  LEFT VENTRICLE PLAX 2D LVIDd:         4.10 cm  Diastology LVIDs:         2.30 cm  LV e' lateral:   6.74 cm/s LV PW:         1.00 cm  LV E/e' lateral: 9.5 LV IVS:        0.80 cm  LV e' medial:    7.72 cm/s LVOT diam:     1.90 cm  LV E/e' medial:  8.3 LV SV:         56 ml LV SV Index:   24.84 LVOT Area:     2.84 cm  RIGHT VENTRICLE RV Basal diam:  2.60 cm RV S prime:     19.00 cm/s TAPSE (M-mode): 2.2 cm LEFT ATRIUM             Index       RIGHT ATRIUM           Index LA diam:        3.40 cm 1.59 cm/m  RA Pressure:  3.00 mmHg LA Vol (A2C):   22.6 ml 10.55 ml/m RA Area:     9.86 cm LA Vol (A4C):   33.0 ml 15.41 ml/m RA Volume:   17.80 ml  8.31 ml/m LA Biplane Vol: 28.3 ml 13.21 ml/m  AORTIC VALVE LVOT Vmax:   93.30 cm/s LVOT Vmean:  64.900 cm/s LVOT VTI:    0.166 m  AORTA Ao Root diam: 3.20 cm Ao Asc diam:  3.50 cm MITRAL VALVE                        TRICUSPID VALVE                                     Estimated RAP:  3.00 mmHg  MV Decel Time: 158 msec             SHUNTS MV E velocity: 64.00 cm/s 103 cm/s  Systemic VTI:  0.17 m MV A velocity: 92.40 cm/s 70.3 cm/s Systemic Diam: 1.90 cm MV E/A ratio:  0.69       1.5  Eleonore Chiquito MD Electronically signed  by Eleonore Chiquito MD Signature Date/Time: 08/03/2019/1:23:51 PM    Final     Pathology: SURGICAL PATHOLOGY  CASE: [phone]  PATIENT: Pearlie Bir  Surgical Pathology Report   Clinical History: no known primary or history of cirrhosis, now with  large infiltrative mass within the liver worrisome for metastatic  disease, post US guided bx (cm)   FINAL MICROSCOPIC DIAGNOSIS:   A. LIVER, NEEDLE CORE BIOPSY:  - Adenocarcinoma, see comment.   COMMENT:   The cores are mostly necrotic with only focal viable malignant glands.  Immunohistochemistry is positive for cytokeratin 7 and PAX-8 (weak).  Cytokeratin 20, CDX-2, TTF-1, ER, and GATA-3 are negative. The  immunoprofile is somewhat non-specific and the differential includes a  pancreaticobiliary, upper gastrointestinal, breast, gynecologic, etc. In  the absence of a primary this could be compatible with  pancreaticobiliary. Dr. Vic Ripper has reviewed the case. The case was  called to Dr. Ardis Hughs on 08/19/2019.   GROSS DESCRIPTION:   The specimen is received in formalin and consists of 5 cores of  tan-white soft tissue, ranging from 1.6 to 2.7 cm in length by 0.1 cm in  diameter. The specimen is entirely submitted in 3 cassettes. Craig Staggers  08/19/2019)   Final Diagnosis performed by Vicente Males, MD.   Electronically signed  08/19/2019  Technical component performed at [facility]. [facility], [place] [address] [place], [place] [address].  Professional component performed at [facility],  [place] [address], [place], [place] [address].  Immunohistochemistry Technical component (if applicable) was performed  at [facility] [address], [place],  [place], [place] [address].  IMMUNOHISTOCHEMISTRY DISCLAIMER (if applicable):  Some of these immunohistochemical stains may have been developed and the  performance characteristics determine by [facility]. Some  may not have been cleared or approved by the U.S. Food and Drug  Administration. The FDA has determined that such clearance or approval  is not necessary. This test is used for clinical purposes. It should not  be regarded as investigational or for research. This laboratory is  certified under the [place]  (CLIA-88) as qualified to perform high complexity clinical laboratory  testing. The controls stained appropriately.  ASSESSMENT & PLAN TACEY DIMAGGIO 54 y.o. female with medical history significant for metastatic adenocarcinoma of unclear origin who presents for a follow up visit. In the interim.  After review of the biopsy findings and discussion with the patient it is unclear at this time what is the primary malignancy.  Biopsy did confirm adenocarcinoma though it was unable to differentiate between pancreaticobiliary versus GYN origin.  Given her markedly enlarged uterus we have requested consultation with Gyn Onc surgical service to help [place] determine if this is of uterine primary.  Fortunately they were able to see her in clinic today and perform an endometrial biopsy.  This case was discussed with Dr. Berline Lopes as well as Dr. Alvy Bimler.   In the interim it is clear that this is a metastatic and incurable cancer.  She will notably  require systemic therapy and therefore it would be reasonable at this time to have a port placed in anticipation of starting therapy.  In the event this were pancreaticobiliary malignancy I would recommend treatment with gemcitabine and cisplatin.  Once the surgical service has completed their evaluation we can presumptively treat with gem cis.  # Metastatic Disease of Unknown Primary, Workup Underway --malignancy represents pancreaticobiliary vs GYN primary. Requested consult with Gyn  Onc surgical service to help r/o uterine primary. Patient saw them today with endometrial biopsy.  --EGD/Colonoscopy showed no clear tumor/mass. Biopsy of a liver lesion confirmed adenocarcinoma with markers non-specific for tumor origin.  --will order CA-125 today to help determine origin.  --recommend moving forward with port placement in anticipation of chemotherapy. In the event this is pancreaticobiliary would recommend gemcitabine/cisplatin therapy.  --await results of biopsy before full treatment plan and prognosis can be determined --RTC 2-3 weeks, presumably with results of endometrial biopsy available for discussion of plan moving forward  #Symptom Management --mild pain on right side when lying flat. Not requiring intervention yet. --no nausea, vomiting, diarrhea, or appetite issues. --continue to monitor  Orders Placed This Encounter  Procedures  . CBC with Differential (Cancer Center Only)    Standing Status:   Future    Number of Occurrences:   1    Standing Expiration Date:   08/26/2020  . CMP ([facility] only)    Standing Status:   Future    Number of Occurrences:   1    Standing Expiration Date:   08/26/2020  . Lactate dehydrogenase (LDH)    Standing Status:   Future    Number of Occurrences:   1    Standing Expiration Date:   08/26/2020  . CA 125    Standing Status:   Future    Number of Occurrences:   1    Standing Expiration Date:   08/26/2020  . Protime-INR    Standing Status:    Future    Number of Occurrences:   1    Standing Expiration Date:   08/26/2020   All questions were answered. The patient knows to call the clinic with any problems, questions or concerns.  A total of more than 40 minutes were spent on this encounter and over half of that time was spent on counseling and coordination of care as outlined above.   Ledell Peoples, MD Department of Hematology/Oncology [place] at [facility] Phone: [phone] Pager: [phone] Email: [email or web address]  08/29/2019 8:46 PM

## 2019-08-28 LAB — CA 125: Cancer Antigen (CA) 125: 52 U/mL — ABNORMAL HIGH (ref 0.0–38.1)

## 2019-08-30 ENCOUNTER — Telehealth: Payer: Self-pay | Admitting: Hematology and Oncology

## 2019-08-30 ENCOUNTER — Encounter (HOSPITAL_COMMUNITY): Payer: Self-pay | Admitting: Radiology

## 2019-08-30 ENCOUNTER — Other Ambulatory Visit: Payer: Self-pay | Admitting: Gynecologic Oncology

## 2019-08-30 ENCOUNTER — Telehealth: Payer: Self-pay

## 2019-08-30 DIAGNOSIS — C801 Malignant (primary) neoplasm, unspecified: Secondary | ICD-10-CM

## 2019-08-30 DIAGNOSIS — D219 Benign neoplasm of connective and other soft tissue, unspecified: Secondary | ICD-10-CM

## 2019-08-30 LAB — SURGICAL PATHOLOGY

## 2019-08-30 NOTE — Telephone Encounter (Signed)
Attempted to call patient with EMB results (benign). No answer, LVM requesting callback. Spoke with Dr. Lorenso Courier who would like to pursue percutaneous uterine biopsy to r/o endometrial origin of metastatic carcinoma. Order placed and message sent to IR physician.  Jeral Pinch MD Gynecologic Oncology

## 2019-08-30 NOTE — Telephone Encounter (Signed)
LM for medical records to fax results for genetic testing done in summer of 2020.

## 2019-08-30 NOTE — Telephone Encounter (Signed)
Melissa Hurley is returning Dr. Charisse March call. Gave her the results as noted below by Dr. Berline Lopes. Gave Melissa Hurley the phone number to IR [phone] as she feels that she deleted that call. Pt verbalized understanding.

## 2019-08-30 NOTE — Telephone Encounter (Signed)
Scheduled per los. Called and left msg. Mailed printout  °

## 2019-08-30 NOTE — Progress Notes (Signed)
Attempted to call patient with EMB results (benign). No answer, LVM requesting callback. Spoke with Dr. Lorenso Courier who would like to pursue percutaneous uterine biopsy to r/o endometrial origin of metastatic carcinoma. Order placed and message sent to IR physician.  Jeral Pinch MD Gynecologic Oncology

## 2019-08-30 NOTE — Progress Notes (Signed)
Melissa Hurley Female, 54 y.o., Mar 10, 1966 MRN:  [id number] Phone:  [phone] [name]) PCP:  Patient, No Pcp Per Coverage:  Sherre Poot Blue [employer] [place] Next Appt With Radiology ([facility]-CT 1) 09/06/2019 at 9:00 AM  RE: CT Biopsy Received: Today Message Contents  Arne Cleveland, MD  Garth Bigness D  Talked to Dr Berline Lopes   CT core 14+cm R fibroid  Consider anterior approach see CT Im70 Se 2   DDH       Previous Messages   ----- Message -----  From: Garth Bigness D  Sent: 08/30/2019 11:59 AM EST  To: Ir Procedure Requests  Subject: CT Biopsy                     Procedure:  CT Biopsy   Reason:  Fibroids, Adenocarcinoma of unknown primary, metastatic adenocarcinoma, large fibroid uterus on CT, negative endometrial biopsy in clinic, please evaluate for possible percutaneous uterine biopsy   History: CT, US Liver Biopsy done on 08/17/2019 in computer   Provider: Lafonda Mosses   Contact: [phone]

## 2019-08-31 NOTE — Telephone Encounter (Signed)
Received [employer] report from 02-26-19. Copy sent to be scanned into EMR. And copy given to Dr. Berline Lopes to review.

## 2019-09-03 ENCOUNTER — Other Ambulatory Visit: Payer: Self-pay | Admitting: Physician Assistant

## 2019-09-05 ENCOUNTER — Other Ambulatory Visit: Payer: Self-pay | Admitting: Radiology

## 2019-09-06 ENCOUNTER — Encounter (HOSPITAL_COMMUNITY): Payer: Self-pay

## 2019-09-06 ENCOUNTER — Ambulatory Visit (HOSPITAL_COMMUNITY)
Admission: RE | Admit: 2019-09-06 | Discharge: 2019-09-06 | Disposition: A | Discharge status: Home / Self Care | Payer: BC Managed Care – PPO | Source: Ambulatory Visit | Attending: Gynecologic Oncology | Admitting: Gynecologic Oncology

## 2019-09-06 ENCOUNTER — Other Ambulatory Visit: Payer: Self-pay

## 2019-09-06 DIAGNOSIS — D259 Leiomyoma of uterus, unspecified: Secondary | ICD-10-CM | POA: Diagnosis not present

## 2019-09-06 DIAGNOSIS — R1900 Intra-abdominal and pelvic swelling, mass and lump, unspecified site: Secondary | ICD-10-CM | POA: Insufficient documentation

## 2019-09-06 DIAGNOSIS — C801 Malignant (primary) neoplasm, unspecified: Secondary | ICD-10-CM

## 2019-09-06 DIAGNOSIS — D219 Benign neoplasm of connective and other soft tissue, unspecified: Secondary | ICD-10-CM

## 2019-09-06 LAB — CBC
HCT: 44.1 % (ref 36.0–46.0)
Hemoglobin: 13.3 g/dL (ref 12.0–15.0)
MCH: 24.6 pg — ABNORMAL LOW (ref 26.0–34.0)
MCHC: 30.2 g/dL (ref 30.0–36.0)
MCV: 81.5 fL (ref 80.0–100.0)
Platelets: 290 10*3/uL (ref 150–400)
RBC: 5.41 MIL/uL — ABNORMAL HIGH (ref 3.87–5.11)
RDW: 14.9 % (ref 11.5–15.5)
WBC: 7.8 10*3/uL (ref 4.0–10.5)
nRBC: 0 % (ref 0.0–0.2)

## 2019-09-06 LAB — PROTIME-INR
INR: 1.1 (ref 0.8–1.2)
Prothrombin Time: 14.3 seconds (ref 11.4–15.2)

## 2019-09-06 MED ORDER — ONDANSETRON HCL 4 MG/2ML IJ SOLN
INTRAMUSCULAR | Status: AC
  Filled 2019-09-06: qty 2

## 2019-09-06 MED ORDER — MIDAZOLAM HCL 2 MG/2ML IJ SOLN
INTRAMUSCULAR | Status: AC
  Filled 2019-09-06: qty 4

## 2019-09-06 MED ORDER — ONDANSETRON HCL 4 MG/2ML IJ SOLN
INTRAMUSCULAR | Status: AC | PRN
  Administered 2019-09-06: 4 mg via INTRAVENOUS

## 2019-09-06 MED ORDER — MIDAZOLAM HCL 2 MG/2ML IJ SOLN
INTRAMUSCULAR | Status: AC | PRN
  Administered 2019-09-06 (×3): 1 mg via INTRAVENOUS

## 2019-09-06 MED ORDER — LIDOCAINE HCL (PF) 1 % IJ SOLN
INTRAMUSCULAR | Status: AC | PRN
  Administered 2019-09-06: 10 mL

## 2019-09-06 MED ORDER — FENTANYL CITRATE (PF) 100 MCG/2ML IJ SOLN
INTRAMUSCULAR | Status: AC
  Filled 2019-09-06: qty 2

## 2019-09-06 MED ORDER — FENTANYL CITRATE (PF) 100 MCG/2ML IJ SOLN
INTRAMUSCULAR | Status: AC | PRN
  Administered 2019-09-06 (×2): 50 ug via INTRAVENOUS

## 2019-09-06 MED ORDER — SODIUM CHLORIDE 0.9 % IV SOLN: INTRAVENOUS | Status: DC

## 2019-09-06 NOTE — Discharge Instructions (Signed)
Moderate Conscious Sedation, Adult, Care After These instructions provide you with information about caring for yourself after your procedure. Your health care provider may also give you more specific instructions. Your treatment has been planned according to current medical practices, but problems sometimes occur. Call your health care provider if you have any problems or questions after your procedure. What can I expect after the procedure? After your procedure, it is common:  To feel sleepy for several hours.  To feel clumsy and have poor balance for several hours.  To have poor judgment for several hours.  To vomit if you eat too soon. Follow these instructions at home: For at least 24 hours after the procedure:   Do not: ? Participate in activities where you could fall or become injured. ? Drive. ? Use heavy machinery. ? Drink alcohol. ? Take sleeping pills or medicines that cause drowsiness. ? Make important decisions or sign legal documents. ? Take care of children on your own.  Rest. Eating and drinking  Follow the diet recommended by your health care provider.  If you vomit: ? Drink water, juice, or soup when you can drink without vomiting. ? Make sure you have little or no nausea before eating solid foods. General instructions  Have a responsible adult stay with you until you are awake and alert.  Take over-the-counter and prescription medicines only as told by your health care provider.  If you smoke, do not smoke without supervision.  Keep all follow-up visits as told by your health care provider. This is important. Contact a health care provider if:  You keep feeling nauseous or you keep vomiting.  You feel light-headed.  You develop a rash.  You have a fever. Get help right away if:  You have trouble breathing. This information is not intended to replace advice given to you by your health care provider. Make sure you discuss any questions you have  with your health care provider. Document Revised: 07/18/2017 Document Reviewed: 11/25/2015 Elsevier Patient Education  2020 Elsevier Inc. Needle Biopsy, Care After These instructions tell you how to care for yourself after your procedure. Your doctor may also give you more specific instructions. Call your doctor if you have any problems or questions. What can I expect after the procedure? After the procedure, it is common to have:  Soreness.  Bruising.  Mild pain. Follow these instructions at home:   Return to your normal activities as told by your doctor. Ask your doctor what activities are safe for you.  Take over-the-counter and prescription medicines only as told by your doctor.  Wash your hands with soap and water before you change your bandage (dressing). If you cannot use soap and water, use hand sanitizer.  Follow instructions from your doctor about: ? How to take care of your puncture site. ? When and how to change your bandage. ? When to remove your bandage.  Check your puncture site every day for signs of infection. Watch for: ? Redness, swelling, or pain. ? Fluid or blood. ? Pus or a bad smell. ? Warmth.  Do not take baths, swim, or use a hot tub until your doctor approves. Ask your doctor if you may take showers. You may only be allowed to take sponge baths.  Keep all follow-up visits as told by your doctor. This is important. Contact a doctor if you have:  A fever.  Redness, swelling, or pain at the puncture site, and it lasts longer than a few days.  Fluid,   blood, or pus coming from the puncture site.  Warmth coming from the puncture site. Get help right away if:  You have a lot of bleeding from the puncture site. Summary  After the procedure, it is common to have soreness, bruising, or mild pain at the puncture site.  Check your puncture site every day for signs of infection, such as redness, swelling, or pain.  Get help right away if you have  severe bleeding from your puncture site. This information is not intended to replace advice given to you by your health care provider. Make sure you discuss any questions you have with your health care provider. Document Revised: 08/18/2017 Document Reviewed: 08/18/2017 Elsevier Patient Education  2020 Elsevier Inc.  

## 2019-09-06 NOTE — Procedures (Signed)
Interventional Radiology Procedure Note  Procedure: CT Guided Biopsy of pelvic mass  Complications: None  Estimated Blood Loss: < 10 mL  Findings: 18 G core biopsy of posterior pelvic mass performed under CT guidance.  Four core samples obtained and sent to Pathology.  Venetia Night. Kathlene Cote, M.D Pager:  [phone]

## 2019-09-06 NOTE — H&P (Signed)
    Referring Physician(s): Tucker,Katherine R/Dorsey,J  Supervising Physician: Aletta Edouard  Patient Status:  [facility] OP  Chief Complaint: "I'm having a biopsy"   Subjective: Patient familiar to IR service from liver lesion biopsy on 08/17/2019 which revealed adenocarcinoma of unknown primary.  She has also had a negative endometrial biopsy.  Prior imaging has also revealed presence of large uterine fibroids.  She presents today for image guided biopsy of uterine mass/fibroid for further evaluation.  She currently denies fever, headache, chest pain, vomiting or abnormal bleeding.  She has had some dyspnea with exertion, occasional cough, intermittent nausea, and some lower abdominal/back discomfort.  Past Medical History:  Diagnosis Date  . Cancer ([place])    Liver  . Dyspnea   . Uterine fibroid    Past Surgical History:  Procedure Laterality Date  . CESAREAN SECTION        Allergies: Patient has no known allergies.  Medications: Prior to Admission medications   Not on File     Vital Signs: Blood pressure 123/95, heart rate 120, temp 98.1, respirations 24 LMP 10/25/2014   Physical Exam awake, alert.  Chest with slightly diminished breath sounds right base, left clear.  Heart with tachycardic but regular rhythm.  Abdomen soft, positive bowel sounds, mildly tender lower abdominal tenderness to palpation.  No lower extremity edema.  Imaging: No results found.  Labs:  CBC: Recent Labs    08/06/19 1406 08/17/19 1130 08/27/19 0905 09/06/19 0726  WBC 8.0 7.5 8.1 7.8  HGB 12.7 13.2 12.6 13.3  HCT 41.7 44.6 41.4 44.1  PLT 264 278 229 290    COAGS: Recent Labs    08/06/19 1407 08/17/19 1130 08/27/19 0905  INR 1.1 1.1 1.2  APTT  --  32  --     BMP: Recent Labs    07/23/19 1116 08/06/19 1406 08/27/19 0905  NA 139 139 139  K 3.7 3.6 3.5  CL 107 107 107  CO2 23 21* 23  GLUCOSE 110* 120* 147*  BUN 14 7 8   CALCIUM 9.9 9.5 9.7  CREATININE 0.92 0.85 0.93   GFRNONAA  --  >60 >60  GFRAA  --  >60 >60    LIVER FUNCTION TESTS: Recent Labs    07/23/19 1116 08/06/19 1406 08/27/19 0905  BILITOT 0.6 0.6 0.9  AST 19 30 19   ALT 12 19 11   ALKPHOS 117 140* 136*  PROT 7.3 7.5 7.5  ALBUMIN 3.5 3.0* 2.9*    Assessment and Plan: Patient with history of recently diagnosed metastatic adenocarcinoma of unknown origin.  Also with known large uterine fibroids.  Prior negative endometrial biopsy.  Presents today for image guided uterine fibroid biopsy for further evaluation.Risks and benefits of procedure was discussed with the patient  including, but not limited to bleeding, infection, damage to adjacent structures or low yield requiring additional tests.  All of the questions were answered and there is agreement to proceed.  Consent signed and in chart.     Electronically Signed: D. Rowe Robert, PA-C 09/06/2019, 8:20 AM   I spent a total of 25 minutes at the the patient's bedside AND on the patient's hospital floor or unit, greater than 50% of which was counseling/coordinating care for image guided uterine mass/fibroid biopsy

## 2019-09-08 ENCOUNTER — Telehealth: Payer: Self-pay | Admitting: *Deleted

## 2019-09-08 ENCOUNTER — Telehealth: Payer: Self-pay | Admitting: Gynecologic Oncology

## 2019-09-08 LAB — SURGICAL PATHOLOGY

## 2019-09-08 NOTE — Telephone Encounter (Signed)
Pt called to see if Dr. Berline Lopes can recommend a doctor outside the [facility] system for a second opinion on her prognosis.

## 2019-09-08 NOTE — Telephone Encounter (Signed)
Received recent IR guided biopsy results of cul-de-sac mass.  Final pathology shows leiomyoma.  I called the patient to let her know these results, no answer.  Left message with callback requested to discuss further.  Jeral Pinch MD Gynecologic Oncology

## 2019-09-09 ENCOUNTER — Telehealth: Payer: Self-pay | Admitting: Hematology and Oncology

## 2019-09-09 NOTE — Telephone Encounter (Signed)
Called and left the patient a message to call the office back. Per Dr Berline Lopes the patient can see a physician at [facility]  for a second opinion

## 2019-09-09 NOTE — Telephone Encounter (Signed)
Returned patient's phone call regarding rescheduling 01/22 appointment, left a voicemail.

## 2019-09-10 ENCOUNTER — Inpatient Hospital Stay: Payer: BC Managed Care – PPO

## 2019-09-10 ENCOUNTER — Inpatient Hospital Stay: Payer: BC Managed Care – PPO | Admitting: Hematology and Oncology

## 2019-09-10 ENCOUNTER — Telehealth: Payer: Self-pay | Admitting: *Deleted

## 2019-09-10 NOTE — Telephone Encounter (Signed)
TCT patient as she did not show for her appts today.. A message in her chart looks like she might have wanted to re-schedule this appt.  No answer but was able to leave message for pt to call back with update.

## 2019-09-10 NOTE — Telephone Encounter (Signed)
Told Melissa Hurley that Dr. Berline Lopes recommends any Medical Oncologist at [facility] for a Second Opinion. The phone number is [phone] or [phone]. Pt appreciated the information.

## 2019-09-13 ENCOUNTER — Telehealth: Payer: Self-pay | Admitting: Oncology

## 2019-09-13 ENCOUNTER — Telehealth: Payer: Self-pay | Admitting: *Deleted

## 2019-09-13 NOTE — Telephone Encounter (Signed)
Left a message regarding 2nd opinion.  Requested a return call.

## 2019-09-13 NOTE — Telephone Encounter (Signed)
TCT patient regarding her missed appt on 09/10/19. No answer but was able to leave vm message for pt to call back as soon as possible.  TCT pt's husband as well. No answer but was able to leave him a vm message to have Tammera return our call as soon as possible.

## 2019-09-20 ENCOUNTER — Encounter: Payer: Self-pay | Admitting: Physician Assistant

## 2019-09-20 NOTE — Progress Notes (Signed)
Cardiology Office Note    Date:  09/22/2019   ID:  Melissa Hurley, DOB [birth year], MRN [id number]  PCP:  Patient, No Pcp Per  Cardiologist:  Will Meredith Leeds, MD  Electrophysiologist:  None   Chief Complaint: f/u pericardial effusion -> complains of shortness of breath  History of Present Illness:   Melissa Hurley is a 54 y.o. female with history of recent adenocarcinoma of unknown primary, uterine fibroids, obesity, and pericardial effusion who presents for f/u of her effusion.  In September 2020 she began to notice upper abdominal bloating, constipation, reflux and low back pain. She saw GI. CT abd/pelvis showed multiple hypovascular liver masses suspicious for hepatic metastasis, multifocal hepatocellular carcinoma felt less likely, mild abdominal lymphadenopathy, consistent with metastatic disease, sub-cm pulm nodules suspicious for pulmonary mets, thickening at the GE junction. She underwent liver biopsy on 08/17/19 with adenocarcinoma of unknown origin. Endoscopy was notable for multiple lesions within the entire esophagus consistent with Candida infection and a small hiatal hernia. Colonoscopy was unremarkable. She has had an elevated CA-125 but it appears GYN workup was unrevealing. She sought a second opinion at [facility] with most recent oncology note from 09/20/19 suspecting biliary source. During her workup, CT chest picked up a moderate pericardial effusion, prompting cardiology evaluation. 2D echo 08/02/20 showed moderate pericardial effusion, no stigmata of cardiac tamponade, EF 55-60%, otherwise no acute findings. Dr. Curt Bears saw her for this finding and recommended f/u in several weeks. Last labs personally reviewed 08/2019 showed Hgb 12.6, K 3.5, Cr 0.98, albumin 2.9, alk phos 136, normal AST/ALT, 07/2019 TSH wnl, ESR >100.  She returns to clinic appearing discouraged. She states she has been continually SOB with even minimal exertion but no one is helping her with this. She also  complains of severe fatigue. She has lost about 30lb. Her initial symptom was bloating and nausea. She is not having any chest pain or palpitations. Resting HR in the 1teens, similar to prior visit. Pulse ox 98%.   Past Medical History:  Diagnosis Date  . Cancer ([place])    Liver  . Dyspnea   . Pericardial effusion   . Uterine fibroid     Past Surgical History:  Procedure Laterality Date  . CESAREAN SECTION      Current Medications: Current Meds  Medication Sig  . megestrol (MEGACE) 40 MG/ML suspension Take 20 mLs by mouth daily.  . ondansetron (ZOFRAN) 8 MG tablet Take 1 tablet by mouth every 8 (eight) hours as needed. nausea  . prochlorperazine (COMPAZINE) 10 MG tablet Take 10 mg by mouth every 8 (eight) hours as needed for nausea or vomiting.    Allergies:   Patient has no known allergies.   Social History   Socioeconomic History  . Marital status: Married    Spouse name: Not on file  . Number of children: Not on file  . Years of education: Not on file  . Highest education level: Not on file  Occupational History  . Occupation: Pharmacist, hospital  Tobacco Use  . Smoking status: Never Smoker  . Smokeless tobacco: Never Used  Substance and Sexual Activity  . Alcohol use: Never  . Drug use: Never  . Sexual activity: Not Currently  Other Topics Concern  . Not on file  Social History Narrative  . Not on file   Social Determinants of Health   Financial Resource Strain:   . Difficulty of Paying Living Expenses: Not on file  Food Insecurity:   . Worried About Running  Out of Food in the Last Year: Not on file  . Ran Out of Food in the Last Year: Not on file  Transportation Needs:   . Lack of Transportation (Medical): Not on file  . Lack of Transportation (Non-Medical): Not on file  Physical Activity:   . Days of Exercise per Week: Not on file  . Minutes of Exercise per Session: Not on file  Stress:   . Feeling of Stress : Not on file  Social Connections:   . Frequency of  Communication with Friends and Family: Not on file  . Frequency of Social Gatherings with Friends and Family: Not on file  . Attends Religious Services: Not on file  . Active Member of Clubs or Organizations: Not on file  . Attends Archivist Meetings: Not on file  . Marital Status: Not on file     Family History:  The patient's family history includes Breast cancer in her cousin, maternal grandmother, and mother; Lung cancer in her mother; Ovarian cancer in her maternal aunt and mother. There is no history of Colon cancer, Esophageal cancer, Rectal cancer, or Stomach cancer.  ROS:   Please see the history of present illness.  All other systems are reviewed and otherwise negative.    EKGs/Labs/Other Studies Reviewed:    Studies reviewed are outlined and summarized above. Reports included below if pertinent.  2D echo 07/2019 IMPRESSIONS  1. There is a moderate circumferential pericardial effusion with  prominent pericardial fat pad. There is normal RV function. There is no  evidence of RV diastolic collapse or variation of TV/MV inflow to suggest  tamponade. The IVC is normal size and  collapsable on the images provided. There is minimal movement of the RA  wall in late systole, but this is a non-specific finding. Overall, there  are no echocardiographic findings to suggest cardiac tamponade.  2. Left ventricular ejection fraction, by visual estimation, is 55 to  60%. The left ventricle has normal function. There is no left ventricular  hypertrophy.  3. Indeterminate diastolic filling due to E-A fusion.  4. The left ventricle has no regional wall motion abnormalities.  5. Global right ventricle has normal systolic function.The right  ventricular size is normal. No increase in right ventricular wall  thickness.  6. Left atrial size was normal.  7. Right atrial size was normal.  8. Moderate pericardial effusion.  9. Presence of pericardial fat pad.  10. The  pericardial effusion is circumferential.  11. Mild mitral annular calcification.  12. The mitral valve is degenerative. No evidence of mitral valve  regurgitation.  13. The tricuspid valve is grossly normal. Tricuspid valve regurgitation  is trivial.  14. The aortic valve is tricuspid. Aortic valve regurgitation is not  visualized. No evidence of aortic valve sclerosis or stenosis.  15. The pulmonic valve was grossly normal. Pulmonic valve regurgitation is  not visualized.  16. No prior Echocardiogram.    EKG:  EKG is ordered today, personally reviewed, demonstrating sinus tach 117bpm, nonspecific TW changes, low-level voltage - similar to prior  Recent Labs: 07/23/2019: TSH 2.72 08/27/2019: ALT 11; BUN 8; Creatinine 0.93; Potassium 3.5; Sodium 139 09/06/2019: Hemoglobin 13.3; Platelets 290  Recent Lipid Panel No results found for: CHOL, TRIG, HDL, CHOLHDL, VLDL, LDLCALC, LDLDIRECT  PHYSICAL EXAM:    VS:  BP 138/82   Pulse (!) 116   Ht 5' 7" (1.702 m)   Wt 213 lb 8 oz (96.8 kg)   LMP 10/25/2014   SpO2 98%  BMI 33.44 kg/m   BMI: Body mass index is 33.44 kg/m.  GEN: Well nourished, well developed AAF, in no acute distress HEENT: normocephalic, atraumatic Neck: no JVD, carotid bruits, or masses Cardiac: tachycardic, regular, no murmurs, rubs, or gallops, no edema  Respiratory:  clear to auscultation bilaterally, normal work of breathing GI: soft, nontender, nondistended, + BS MS: no deformity or atrophy Skin: warm and dry, no rash Neuro:  Alert and Oriented x 3, Strength and sensation are intact, follows commands Psych: discouraged affect  Wt Readings from Last 3 Encounters:  09/22/19 213 lb 8 oz (96.8 kg)  08/27/19 225 lb (102.1 kg)  08/27/19 227 lb 11.2 oz (103.3 kg)     ASSESSMENT & PLAN:   1. Dyspnea on exertion - progressive over the last several months.  I spoke with Dr. Caryl Comes at length about her given the severity of her symptoms. She did not feel like she needed  to go to the hospital. We arranged stat limited echocardiogram today given her tachycardia and SOB. This showed a small effusion without signs of tamponade. Dr. Caryl Comes spoke with Dr. Jimmy Footman at [facility]. Per their discussion, given her malignancy we then proceeded with a CTA to r/o PE. This study was limited by suboptimal opacification but did not show any evidence of PE. There were multiple pulmonary nodules again seen, some of which have slightly increased in size from prior. There was resolution of prior pleural effusion and otherwise no signs of vascular congestion, consolidation or pneumothorax. Dr. Jimmy Footman indicated to Dr. Caryl Comes that he would be in contact with the patient in the next few days to f/u her shortness of breath. In the meantime, some of her dyspnea may be related to her tachycardia - which is likely secondary to her underlying physiologic stressors. We will initiate a trial of Lopressor 12m BID. The patient had already left CT by the time the study was completed, so I called her to let her know the plan - but got voicemail. See phone note.  2. Pericardial effusion - we sent her for stat limited echocardiogram today which demonstrated findings above. Concern remains that this may represent malignant effusion, but no current indication to tap. Unlikely to be contributing to symptoms at this point.  3. Sinus tachycardia - she appears to have been running sinus tach intermittently up to the 120s since at least December. Hgb, TSH have been normal. Trial beta blocker as above.  4. [facility] I will send a copy of this note to Dr. RJimmy Footmanas well as a copy of the CTA.  Disposition: F/u with me in 141montho reassess HR response.   Medication Adjustments/Labs and Tests Ordered: Current medicines are reviewed at length with the patient today.  Concerns regarding medicines are outlined above.  Medication changes, Labs and Tests ordered today are summarized above and listed in the Patient Instructions accessible in Encounters. 40 minutes spent in patient encounter today including coordination of care with studies/discussion with MD.  Signed, DaCharlie PitterPA-C  09/22/2019 5:[place]  [id number]: [phone]: [phone]

## 2019-09-22 ENCOUNTER — Other Ambulatory Visit: Payer: Self-pay

## 2019-09-22 ENCOUNTER — Telehealth: Payer: Self-pay | Admitting: Physician Assistant

## 2019-09-22 ENCOUNTER — Ambulatory Visit (HOSPITAL_COMMUNITY): Payer: BC Managed Care – PPO | Attending: Physician Assistant

## 2019-09-22 ENCOUNTER — Ambulatory Visit: Payer: BC Managed Care – PPO | Admitting: Physician Assistant

## 2019-09-22 ENCOUNTER — Ambulatory Visit (INDEPENDENT_AMBULATORY_CARE_PROVIDER_SITE_OTHER)
Admission: RE | Admit: 2019-09-22 | Discharge: 2019-09-22 | Disposition: A | Discharge status: Home / Self Care | Payer: BC Managed Care – PPO | Source: Ambulatory Visit | Attending: Physician Assistant | Admitting: Physician Assistant

## 2019-09-22 ENCOUNTER — Encounter: Payer: Self-pay | Admitting: Physician Assistant

## 2019-09-22 VITALS — BP 138/82 | HR 116 | Ht 67.0 in | Wt 213.5 lb

## 2019-09-22 DIAGNOSIS — C801 Malignant (primary) neoplasm, unspecified: Secondary | ICD-10-CM

## 2019-09-22 DIAGNOSIS — I3139 Other pericardial effusion (noninflammatory): Secondary | ICD-10-CM

## 2019-09-22 DIAGNOSIS — R0609 Other forms of dyspnea: Secondary | ICD-10-CM

## 2019-09-22 DIAGNOSIS — I313 Pericardial effusion (noninflammatory): Secondary | ICD-10-CM | POA: Diagnosis present

## 2019-09-22 DIAGNOSIS — R0600 Dyspnea, unspecified: Secondary | ICD-10-CM

## 2019-09-22 DIAGNOSIS — R0602 Shortness of breath: Secondary | ICD-10-CM | POA: Diagnosis not present

## 2019-09-22 DIAGNOSIS — R000 Tachycardia, unspecified: Secondary | ICD-10-CM

## 2019-09-22 LAB — ECHOCARDIOGRAM LIMITED
Height: 67 in
Weight: 3416 oz

## 2019-09-22 MED ORDER — METOPROLOL TARTRATE 25 MG PO TABS: 25.0000 mg | ORAL_TABLET | Freq: Two times a day (BID) | ORAL | 3 refills | Status: DC

## 2019-09-22 MED ORDER — IOHEXOL 350 MG/ML SOLN
80.0000 mL | Freq: Once | INTRAVENOUS | Status: AC | PRN
  Administered 2019-09-22: 17:00:00 80 mL via INTRAVENOUS

## 2019-09-22 NOTE — Patient Instructions (Addendum)
Medication Instructions:  Your physician recommends that you continue on your current medications as directed. Please refer to the Current Medication list given to you today.  *If you need a refill on your cardiac medications before your next appointment, please call your pharmacy*  Lab Work: None ordered  If you have labs (blood work) drawn today and your tests are completely normal, you will receive your results only by: Marland Kitchen MyChart Message (if you have MyChart) OR . A paper copy in the mail If you have any lab test that is abnormal or we need to change your treatment, we will call you to review the results.  Testing/Procedures: Your physician has requested that you have an echocardiogram (TODAY). Echocardiography is a painless test that uses sound waves to create images of your heart. It provides your doctor with information about the size and shape of your heart and how well your heart's chambers and valves are working. This procedure takes approximately one hour. There are no restrictions for this procedure.   Your physician has requested you to have a Non-Cardiac CT Angiography (CTA), (TODAY) is a special type of CT scan that uses a computer to produce multi-dimensional views of major blood vessels throughout the body. In CT angiography, a contrast material is injected through an IV to help visualize the blood vessels  Follow-Up: At [facility], you and your health needs are our priority.  As part of our continuing mission to provide you with exceptional heart care, we have created designated Provider Care Teams.  These Care Teams include your primary Cardiologist (physician) and Advanced Practice Providers (APPs -  Physician Assistants and Nurse Practitioners) who all work together to provide you with the care you need, when you need it.  Your next appointment:   10/21/2019 ARRIVE AT 1:00 FOR REGISTRATION  The format for your next appointment:   In Person  Provider:   Melina Copa,  PA-C  Other Instructions

## 2019-09-22 NOTE — Telephone Encounter (Signed)
Patient returned call, discussed result as summarized by Melina Copa. She will get her metoprolol rx today.

## 2019-09-22 NOTE — Telephone Encounter (Signed)
   Stat CTA report received: 1. No CT evidence of central pulmonary embolism. 2. Multiple pulmonary nodules some of which have slightly increased in size since the prior CT. Clinical correlation is recommended. 3. Probable mildly enlarged lymph node adjacent to the distal esophagus similar to prior CT. 4. Interval resolution of the previously seen right pleural effusion. 5. Similar appearance of pericardial effusion. 6. Ill-defined hypodense liver mass.  Dr. Caryl Comes will plan to relay scan results to Dr. Jimmy Footman as per their discussion. We will send in rx for Lopressor 25mg  BID which I have done. Patient has already left so I tried to call her but got VM. I signed out to colleague on call to be aware of report/plan as I anticipate she will call back.  Elaina Cara PA-C

## 2019-09-23 NOTE — Telephone Encounter (Signed)
I called patient today to check back up on her to find out if she had any questions about her CT results. I let her know that Dr. Caryl Comes relayed the scan result to her oncologist. She plans to pick the Lopressor up today. She was grateful for call.  Ahtziri Jeffries PA-C

## 2019-09-27 ENCOUNTER — Telehealth: Payer: Self-pay | Admitting: *Deleted

## 2019-09-27 NOTE — Telephone Encounter (Signed)
Received vm message from pt's husband (Dr. Lorenso Courier had called earlier in the day).Chrissie Noa stated that his wife, Melissa Hurley, was going to be continuing her treatment @ [facility] at this time for chemo and genetic studies.  He appreciated the call from Dr. Lorenso Courier. Dr. Lorenso Courier made aware.Marland Kitchen

## 2019-10-18 ENCOUNTER — Encounter: Payer: Self-pay | Admitting: Physician Assistant

## 2019-10-18 NOTE — Progress Notes (Deleted)
Cardiology Office Note    Date:  10/18/2019   ID:  Melissa Hurley, DOB [birth year], MRN [id number]  PCP:  Patient, No Pcp Per  Cardiologist:  Will Meredith Leeds, MD  Electrophysiologist:  None   Chief Complaint: f/u pericardial effusion, tachycardia  History of Present Illness:   Melissa Hurley is a 54 y.o. female with history of  recently diagnosed metastatic cholangiocarcinoma, uterine fibroids, obesity, esophageal candida, pericardial effusion, and sinus tachycardia who presents for ongoing cardiology follow-up.  In September 2020 she began to notice upper abdominal bloating, constipation, reflux and low back pain. She saw GI. CT abd/pelvis showed multiple hypovascular liver masses suspicious for hepatic metastasis, multifocal hepatocellular carcinoma felt less likely, mild abdominal lymphadenopathy, consistent with metastatic disease, sub-cm pulm nodules suspicious for pulmonary metastases, thickening at the GE junction. She underwent liver biopsy on 08/17/19 with adenocarcinoma of unknown origin. Endoscopy was notable for multiple lesions within the entire esophagus consistent with Candida infection and a small hiatal hernia. Colonoscopy was unremarkable. She sought a second opinion at [facility] who suspected biliary source. She was ultimately found to have cholangiocarcinoma. During her workup, CT chest picked up a moderate pericardial effusion, prompting cardiology evaluation. 2D echo 08/02/20 showed moderate pericardial effusion, no stigmata of cardiac tamponade, EF 55-60%, otherwise no acute findings. Dr. Curt Bears saw her for this finding and recommended f/u in several weeks. When I met her in February 2021, she was discouraged by ongoing significant dyspnea on exertion. Repeat echocardiogram showed only a small pericardial effusion but otherwise no significant findings. CT angio excluded PE but did show multiple pulmonary nodules, some which had increased slihgtly in size since prior CT.  This was related to her oncologist. Per discussion with Dr. Caryl Comes (DOD that day), metoprolol was added for her continued sinus tachycardia. Last labs personally reviewed 08/2019 showed Hgb 13.3, K 3.5, Cr 0.93, albumin 2.9, alk phos 136, normal AST/ALT, 07/2019 TSH wnl, ESR >100.  Dyspnea Sinus tachycardia Pericardial effusion   Past Medical History:  Diagnosis Date  . Cancer ([place])    Liver  . Cholangiocarcinoma ([place])   . Metastasis ([place])   . Pericardial effusion   . Sinus tachycardia   . Uterine fibroid     Past Surgical History:  Procedure Laterality Date  . CESAREAN SECTION      Current Medications: No outpatient medications have been marked as taking for the 10/21/19 encounter (Appointment) with Charlie Pitter, PA-C.   Current Facility-Administered Medications for the 10/21/19 encounter (Appointment) with Charlie Pitter, PA-C  Medication  . ondansetron (ZOFRAN) 4 mg in sodium chloride 0.9 % 50 mL IVPB   ***   Allergies:   Patient has no known allergies.   Social History   Socioeconomic History  . Marital status: Married    Spouse name: Not on file  . Number of children: Not on file  . Years of education: Not on file  . Highest education level: Not on file  Occupational History  . Occupation: Pharmacist, hospital  Tobacco Use  . Smoking status: Never Smoker  . Smokeless tobacco: Never Used  Substance and Sexual Activity  . Alcohol use: Never  . Drug use: Never  . Sexual activity: Not Currently  Other Topics Concern  . Not on file  Social History Narrative  . Not on file   Social Determinants of Health   Financial Resource Strain:   . Difficulty of Paying Living Expenses: Not on file  Food Insecurity:   . Worried About  Running Out of Food in the Last Year: Not on file  . Ran Out of Food in the Last Year: Not on file  Transportation Needs:   . Lack of Transportation (Medical): Not on file  . Lack of Transportation (Non-Medical): Not on file  Physical Activity:   .  Days of Exercise per Week: Not on file  . Minutes of Exercise per Session: Not on file  Stress:   . Feeling of Stress : Not on file  Social Connections:   . Frequency of Communication with Friends and Family: Not on file  . Frequency of Social Gatherings with Friends and Family: Not on file  . Attends Religious Services: Not on file  . Active Member of Clubs or Organizations: Not on file  . Attends Archivist Meetings: Not on file  . Marital Status: Not on file     Family History:  The patient's ***family history includes Breast cancer in her cousin, maternal grandmother, and mother; Lung cancer in her mother; Ovarian cancer in her maternal aunt and mother. There is no history of Colon cancer, Esophageal cancer, Rectal cancer, or Stomach cancer.  ROS:   Please see the history of present illness. Otherwise, review of systems is positive for ***.  All other systems are reviewed and otherwise negative.    EKGs/Labs/Other Studies Reviewed:    Studies reviewed are outlined and summarized above. Reports included below if pertinent.  Repeat echo 09/22/19 1. Left ventricular ejection fraction, by visual estimation, is 70 to  75%. The left ventricle has hyperdynamic function. There is no increased  left ventricular wall thickness.  2. Global right ventricle has normal systolc function.The right  ventricular size is normal. no increase in right ventricular wall  thickness.  3. Small pericardial effusion.  4. The pericardial effusion is anterior to the right ventricle.  5. The mitral valve is normal in structure. No evidence of mitral valve  regurgitation. No evidence of mitral stenosis.  6. The tricuspid valve was normal in structure. Tricuspid valve  regurgitation is not demonstrated.  7. Tricuspid valve regurgitation is not demonstrated.  8. No evidence of aortic valve sclerosis or stenosis.  9. The pulmonic valve was normal in structure.  10. The inferior vena cava  is normal in size with greater than 50%  respiratory variability, suggesting right atrial pressure of 3 mmHg.     EKG:  EKG is ordered today, personally reviewed, demonstrating ***  Recent Labs: 07/23/2019: TSH 2.72 08/27/2019: ALT 11; BUN 8; Creatinine 0.93; Potassium 3.5; Sodium 139 09/06/2019: Hemoglobin 13.3; Platelets 290  Recent Lipid Panel No results found for: CHOL, TRIG, HDL, CHOLHDL, VLDL, LDLCALC, LDLDIRECT  PHYSICAL EXAM:    VS:  LMP 10/25/2014   BMI: There is no height or weight on file to calculate BMI.  GEN: Well nourished, well developed, in no acute distress HEENT: normocephalic, atraumatic Neck: no JVD, carotid bruits, or masses Cardiac: ***RRR; no murmurs, rubs, or gallops, no edema  Respiratory:  clear to auscultation bilaterally, normal work of breathing GI: soft, nontender, nondistended, + BS MS: no deformity or atrophy Skin: warm and dry, no rash Neuro:  Alert and Oriented x 3, Strength and sensation are intact, follows commands Psych: euthymic mood, full affect  Wt Readings from Last 3 Encounters:  09/22/19 213 lb 8 oz (96.8 kg)  08/27/19 225 lb (102.1 kg)  08/27/19 227 lb 11.2 oz (103.3 kg)     ASSESSMENT & PLAN:   1. ***  Disposition: F/u with ***  Medication Adjustments/Labs and Tests Ordered: Current medicines are reviewed at length with the patient today.  Concerns regarding medicines are outlined above. Medication changes, Labs and Tests ordered today are summarized above and listed in the Patient Instructions accessible in Encounters.   Signed, Charlie Pitter, PA-C  10/18/2019 11:45 AM    [facility] [place], [place], [place]  [address] Phone: [phone]; Fax: [phone]

## 2019-10-21 ENCOUNTER — Ambulatory Visit: Payer: BC Managed Care – PPO | Admitting: Physician Assistant

## 2019-10-25 ENCOUNTER — Emergency Department (HOSPITAL_COMMUNITY): Payer: BC Managed Care – PPO

## 2019-10-25 ENCOUNTER — Other Ambulatory Visit: Payer: Self-pay

## 2019-10-25 ENCOUNTER — Inpatient Hospital Stay (HOSPITAL_COMMUNITY)
Admission: EM | Admit: 2019-10-25 | Discharge: 2019-10-28 | DRG: 065 | Disposition: A | Discharge status: Home Health | Payer: BC Managed Care – PPO | Source: Ambulatory Visit | Attending: Internal Medicine | Admitting: Internal Medicine

## 2019-10-25 ENCOUNTER — Inpatient Hospital Stay (HOSPITAL_COMMUNITY): Payer: BC Managed Care – PPO

## 2019-10-25 DIAGNOSIS — C221 Intrahepatic bile duct carcinoma: Secondary | ICD-10-CM | POA: Diagnosis present

## 2019-10-25 DIAGNOSIS — I63413 Cerebral infarction due to embolism of bilateral middle cerebral arteries: Secondary | ICD-10-CM | POA: Diagnosis present

## 2019-10-25 DIAGNOSIS — E872 Acidosis, unspecified: Secondary | ICD-10-CM

## 2019-10-25 DIAGNOSIS — Z803 Family history of malignant neoplasm of breast: Secondary | ICD-10-CM | POA: Diagnosis not present

## 2019-10-25 DIAGNOSIS — R402412 Glasgow coma scale score 13-15, at arrival to emergency department: Secondary | ICD-10-CM | POA: Diagnosis present

## 2019-10-25 DIAGNOSIS — Z801 Family history of malignant neoplasm of trachea, bronchus and lung: Secondary | ICD-10-CM

## 2019-10-25 DIAGNOSIS — R9431 Abnormal electrocardiogram [ECG] [EKG]: Secondary | ICD-10-CM | POA: Diagnosis present

## 2019-10-25 DIAGNOSIS — R4701 Aphasia: Secondary | ICD-10-CM | POA: Diagnosis present

## 2019-10-25 DIAGNOSIS — Z8041 Family history of malignant neoplasm of ovary: Secondary | ICD-10-CM | POA: Diagnosis not present

## 2019-10-25 DIAGNOSIS — Z20822 Contact with and (suspected) exposure to covid-19: Secondary | ICD-10-CM | POA: Diagnosis present

## 2019-10-25 DIAGNOSIS — R471 Dysarthria and anarthria: Secondary | ICD-10-CM | POA: Diagnosis not present

## 2019-10-25 DIAGNOSIS — D6859 Other primary thrombophilia: Secondary | ICD-10-CM | POA: Diagnosis present

## 2019-10-25 DIAGNOSIS — R29701 NIHSS score 1: Secondary | ICD-10-CM | POA: Diagnosis present

## 2019-10-25 DIAGNOSIS — C801 Malignant (primary) neoplasm, unspecified: Secondary | ICD-10-CM | POA: Diagnosis present

## 2019-10-25 DIAGNOSIS — I63412 Cerebral infarction due to embolism of left middle cerebral artery: Secondary | ICD-10-CM | POA: Diagnosis present

## 2019-10-25 DIAGNOSIS — I639 Cerebral infarction, unspecified: Secondary | ICD-10-CM | POA: Diagnosis not present

## 2019-10-25 DIAGNOSIS — Z6832 Body mass index (BMI) 32.0-32.9, adult: Secondary | ICD-10-CM | POA: Diagnosis not present

## 2019-10-25 DIAGNOSIS — Z9221 Personal history of antineoplastic chemotherapy: Secondary | ICD-10-CM | POA: Diagnosis not present

## 2019-10-25 DIAGNOSIS — C799 Secondary malignant neoplasm of unspecified site: Secondary | ICD-10-CM | POA: Diagnosis present

## 2019-10-25 DIAGNOSIS — R2689 Other abnormalities of gait and mobility: Secondary | ICD-10-CM | POA: Diagnosis present

## 2019-10-25 DIAGNOSIS — R000 Tachycardia, unspecified: Secondary | ICD-10-CM | POA: Diagnosis present

## 2019-10-25 DIAGNOSIS — Z79899 Other long term (current) drug therapy: Secondary | ICD-10-CM | POA: Diagnosis not present

## 2019-10-25 DIAGNOSIS — N3000 Acute cystitis without hematuria: Secondary | ICD-10-CM | POA: Diagnosis present

## 2019-10-25 DIAGNOSIS — D649 Anemia, unspecified: Secondary | ICD-10-CM

## 2019-10-25 DIAGNOSIS — D630 Anemia in neoplastic disease: Secondary | ICD-10-CM | POA: Diagnosis present

## 2019-10-25 DIAGNOSIS — E669 Obesity, unspecified: Secondary | ICD-10-CM | POA: Diagnosis present

## 2019-10-25 DIAGNOSIS — I6389 Other cerebral infarction: Secondary | ICD-10-CM | POA: Diagnosis not present

## 2019-10-25 LAB — CBC WITH DIFFERENTIAL/PLATELET
Abs Immature Granulocytes: 0.07 10*3/uL (ref 0.00–0.07)
Basophils Absolute: 0 10*3/uL (ref 0.0–0.1)
Basophils Relative: 1 %
Eosinophils Absolute: 0 10*3/uL (ref 0.0–0.5)
Eosinophils Relative: 1 %
HCT: 38.2 % (ref 36.0–46.0)
Hemoglobin: 11.6 g/dL — ABNORMAL LOW (ref 12.0–15.0)
Immature Granulocytes: 2 %
Lymphocytes Relative: 42 %
Lymphs Abs: 1.7 10*3/uL (ref 0.7–4.0)
MCH: 24.5 pg — ABNORMAL LOW (ref 26.0–34.0)
MCHC: 30.4 g/dL (ref 30.0–36.0)
MCV: 80.8 fL (ref 80.0–100.0)
Monocytes Absolute: 1 10*3/uL (ref 0.1–1.0)
Monocytes Relative: 25 %
Neutro Abs: 1.1 10*3/uL — ABNORMAL LOW (ref 1.7–7.7)
Neutrophils Relative %: 29 %
Platelets: 518 10*3/uL — ABNORMAL HIGH (ref 150–400)
RBC: 4.73 MIL/uL (ref 3.87–5.11)
RDW: 18.7 % — ABNORMAL HIGH (ref 11.5–15.5)
WBC: 3.9 10*3/uL — ABNORMAL LOW (ref 4.0–10.5)
nRBC: 0 % (ref 0.0–0.2)

## 2019-10-25 LAB — COMPREHENSIVE METABOLIC PANEL
ALT: 26 U/L (ref 0–44)
AST: 25 U/L (ref 15–41)
Albumin: 2.6 g/dL — ABNORMAL LOW (ref 3.5–5.0)
Alkaline Phosphatase: 105 U/L (ref 38–126)
Anion gap: 9 (ref 5–15)
BUN: 10 mg/dL (ref 6–20)
CO2: 18 mmol/L — ABNORMAL LOW (ref 22–32)
Calcium: 8.1 mg/dL — ABNORMAL LOW (ref 8.9–10.3)
Chloride: 114 mmol/L — ABNORMAL HIGH (ref 98–111)
Creatinine, Ser: 1 mg/dL (ref 0.44–1.00)
GFR calc Af Amer: >60 mL/min (ref >60)
GFR calc non Af Amer: >60 mL/min (ref >60)
Glucose, Bld: 133 mg/dL — ABNORMAL HIGH (ref 70–99)
Potassium: 4.2 mmol/L (ref 3.5–5.1)
Sodium: 141 mmol/L (ref 135–145)
Total Bilirubin: 0.7 mg/dL (ref 0.3–1.2)
Total Protein: 6.3 g/dL — ABNORMAL LOW (ref 6.5–8.1)

## 2019-10-25 LAB — PROTIME-INR
INR: 1.4 — ABNORMAL HIGH (ref 0.8–1.2)
Prothrombin Time: 16.6 seconds — ABNORMAL HIGH (ref 11.4–15.2)

## 2019-10-25 LAB — AMMONIA: Ammonia: 13 umol/L (ref 9–35)

## 2019-10-25 LAB — LIPASE, BLOOD: Lipase: 16 U/L (ref 11–51)

## 2019-10-25 LAB — LACTIC ACID, PLASMA: Lactic Acid, Venous: 2.2 mmol/L (ref 0.5–1.9)

## 2019-10-25 MED ORDER — PANTOPRAZOLE SODIUM 40 MG PO TBEC
40.0000 mg | DELAYED_RELEASE_TABLET | Freq: Every day | ORAL | Status: DC
  Administered 2019-10-26 – 2019-10-28 (×3): 40 mg via ORAL
  Filled 2019-10-25 (×3): qty 1

## 2019-10-25 MED ORDER — CALCIUM CARBONATE-VITAMIN D 500-200 MG-UNIT PO TABS
1.0000 | ORAL_TABLET | Freq: Every day | ORAL | Status: DC
  Administered 2019-10-26 – 2019-10-28 (×3): 1 via ORAL
  Filled 2019-10-25 (×3): qty 1

## 2019-10-25 MED ORDER — SODIUM CHLORIDE 0.9 % IV SOLN: INTRAVENOUS | Status: DC

## 2019-10-25 MED ORDER — SODIUM CHLORIDE 0.9 % IV BOLUS
1000.0000 mL | Freq: Once | INTRAVENOUS | Status: AC
  Administered 2019-10-25: 1000 mL via INTRAVENOUS

## 2019-10-25 MED ORDER — ACETAMINOPHEN 325 MG PO TABS: 650.0000 mg | ORAL_TABLET | ORAL | Status: DC | PRN

## 2019-10-25 MED ORDER — ACETAMINOPHEN 160 MG/5ML PO SOLN: 650.0000 mg | ORAL | Status: DC | PRN

## 2019-10-25 MED ORDER — STROKE: EARLY STAGES OF RECOVERY BOOK
Freq: Once | Status: AC
  Filled 2019-10-25: qty 1

## 2019-10-25 MED ORDER — ATORVASTATIN CALCIUM 80 MG PO TABS: 80.0000 mg | ORAL_TABLET | Freq: Every day | ORAL | Status: DC

## 2019-10-25 MED ORDER — ENOXAPARIN SODIUM 40 MG/0.4ML ~~LOC~~ SOLN
40.0000 mg | SUBCUTANEOUS | Status: DC
  Administered 2019-10-26 – 2019-10-28 (×3): 40 mg via SUBCUTANEOUS
  Filled 2019-10-25 (×3): qty 0.4

## 2019-10-25 MED ORDER — METOPROLOL TARTRATE 25 MG PO TABS
25.0000 mg | ORAL_TABLET | Freq: Two times a day (BID) | ORAL | Status: DC
  Administered 2019-10-26 – 2019-10-28 (×6): 25 mg via ORAL
  Filled 2019-10-25 (×6): qty 1

## 2019-10-25 MED ORDER — CHLORHEXIDINE GLUCONATE CLOTH 2 % EX PADS
6.0000 | MEDICATED_PAD | Freq: Every day | CUTANEOUS | Status: DC
  Administered 2019-10-26 – 2019-10-28 (×3): 6 via TOPICAL

## 2019-10-25 MED ORDER — ASPIRIN EC 325 MG PO TBEC
325.0000 mg | DELAYED_RELEASE_TABLET | Freq: Every day | ORAL | Status: DC
  Administered 2019-10-26 – 2019-10-28 (×3): 325 mg via ORAL
  Filled 2019-10-25 (×3): qty 1

## 2019-10-25 MED ORDER — ASPIRIN EC 325 MG PO TBEC
325.0000 mg | DELAYED_RELEASE_TABLET | Freq: Once | ORAL | Status: AC
  Administered 2019-10-26: 325 mg via ORAL
  Filled 2019-10-25: qty 1

## 2019-10-25 MED ORDER — IOHEXOL 350 MG/ML SOLN
80.0000 mL | Freq: Once | INTRAVENOUS | Status: AC | PRN
  Administered 2019-10-25: 80 mL via INTRAVENOUS

## 2019-10-25 MED ORDER — SODIUM CHLORIDE 0.9% FLUSH
10.0000 mL | INTRAVENOUS | Status: DC | PRN
  Administered 2019-10-28: 11:00:00 10 mL

## 2019-10-25 MED ORDER — ACETAMINOPHEN 650 MG RE SUPP: 650.0000 mg | RECTAL | Status: DC | PRN

## 2019-10-25 MED ORDER — SODIUM CHLORIDE 0.9% FLUSH
10.0000 mL | Freq: Two times a day (BID) | INTRAVENOUS | Status: DC
  Administered 2019-10-26 – 2019-10-27 (×2): 10 mL
  Administered 2019-10-27: 20 mL
  Administered 2019-10-28: 08:00:00 10 mL

## 2019-10-25 NOTE — H&P (Signed)
History and Physical    Melissa Hurley [id number] DOB: 11/08/1965 DOA: 10/25/2019  PCP: Patient, No Pcp Per Patient coming from: Home  Chief Complaint: Slurred speech and gait instability  HPI: Melissa Hurley is a 54 y.o. female with medical history significant of recently diagnosed cholangiocarcinoma with metastasis being managed at oncology [facility] currently undergoing chemotherapy presenting to the ED for evaluation of slurred/incomprehensible speech x3 days.  No history could be obtained from the patient due to her dysarthric speech.  No family available at this time.  Per ED documentation, patient has been less mobile and more weak over the past 2 weeks.  She has been off balance when walking and has been holding onto objects to help her get around.  Husband reported to ED provider that her speech has been slurred for about a week however transiently improved before becoming worse again.  Patient has been eating and drinking less.   ED Course: Tachycardic and blood pressure soft on arrival.  Vitals improved after IV fluid bolus was given.  Afebrile.  Labs showing no leukocytosis (WBC count 3.9).  Lactic acid 2.2.  Hemoglobin 11.6.  Platelet count 518.  UA pending.  Bicarb 18, anion gap 9.  Blood glucose 133.  BUN 10, creatinine 1.0.  LFTs normal.  Lipase normal.  Ammonia level normal.  Noted to have significant dysarthria.  Head CT showing hypodensity/edema within the right temporal lobe and posterior insula, favored to be acute to subacute infarct over mass/metastatic disease.  CT negative for hemorrhage, midline shift, or significant mass-effect.  Brain MRI showing acute and more subacute appearing infarctions involving the right much greater than left MCA territories.  Also showing minor involvement of the right cerebellum.  No evidence of hemorrhage on MRI. Neurology was consulted.  Patient was given aspirin 324 mg.  Chest x-ray showed no acute process.  Review of Systems:   All systems reviewed and apart from history of presenting illness, are negative.  Past Medical History:  Diagnosis Date  . Cancer ([place])    Liver  . Cholangiocarcinoma ([place])   . Metastasis ([place])   . Pericardial effusion   . Sinus tachycardia   . Uterine fibroid     Past Surgical History:  Procedure Laterality Date  . CESAREAN SECTION       reports that she has never smoked. She has never used smokeless tobacco. She reports that she does not drink alcohol or use drugs.  No Known Allergies  Family History  Problem Relation Age of Onset  . Breast cancer Mother        63's  . Ovarian cancer Mother   . Lung cancer Mother        70s  . Ovarian cancer Maternal Aunt   . Breast cancer Maternal Grandmother   . Breast cancer Cousin        maternal, dx50+  . Colon cancer Neg Hx   . Esophageal cancer Neg Hx   . Rectal cancer Neg Hx   . Stomach cancer Neg Hx     Prior to Admission medications   Medication Sig Start Date End Date Taking? Authorizing Provider  calcium citrate-vitamin D (CITRACAL+D) 315-200 MG-UNIT tablet Take 1 tablet by mouth in the morning and at bedtime. 10/12/19 11/11/19 Yes [provider]  dexamethasone (DECADRON) 4 MG tablet Take 8 mg by mouth See admin instructions. Take 8 mg (2 tablets) by mouth with breakfast on days 2 and 3 of each treatment cycle 10/04/19  Yes [provider]  fluconazole (DIFLUCAN) 100 MG tablet Take 100 mg by mouth daily. 10/14/19  Yes [provider]  LORazepam (ATIVAN) 1 MG tablet Take 1 mg by mouth every 8 (eight) hours as needed for anxiety.  10/12/19  Yes [provider]  megestrol (MEGACE) 40 MG/ML suspension Take 400 mg by mouth 2 (two) times daily.  09/20/19  Yes [provider]  nystatin (MYCOSTATIN) 100000 UNIT/ML suspension Take 5 mLs by mouth every 4 (four) hours as needed (as directed).  10/14/19  Yes [provider]  ondansetron (ZOFRAN) 8 MG tablet Take 8 mg by mouth every 8  (eight) hours as needed for nausea.  09/20/19  Yes [provider]  pantoprazole (PROTONIX) 40 MG tablet Take 40 mg by mouth daily before breakfast.  10/13/19  Yes [provider]  prochlorperazine (COMPAZINE) 10 MG tablet Take 10 mg by mouth every 8 (eight) hours as needed for nausea or vomiting.   Yes [provider]  metoprolol tartrate (LOPRESSOR) 25 MG tablet Take 1 tablet (25 mg total) by mouth 2 (two) times daily. Patient not taking: Reported on 10/25/2019 09/22/19 12/21/19  Charlie Pitter, PA-C  PARoxetine (PAXIL) 10 MG tablet Take 10 mg by mouth daily. 10/11/19   [provider]    Physical Exam: Vitals:   10/25/19 1630 10/25/19 1800 10/25/19 1845 10/25/19 2138  BP:    108/70  Pulse: (!) 118 (!) 101 (!) 106 (!) 102  Resp:    (!) 24  Temp:      TempSrc:      SpO2: 100% 100% 100% 100%  Weight:      Height:        Physical Exam  Constitutional: She appears well-developed and well-nourished.  HENT:  Head: Normocephalic.  Eyes: EOM are normal.  Cardiovascular: Normal rate, regular rhythm and intact distal pulses.  Pulmonary/Chest: Effort normal and breath sounds normal. No respiratory distress. She has no wheezes. She has no rales.  Abdominal: Soft. Bowel sounds are normal. She exhibits no distension. There is no abdominal tenderness. There is no guarding.  Musculoskeletal:        General: No edema.     Cervical back: Neck supple.  Neurological: She is alert.  Awake and alert Speech dysarthric Strength 5 out of 5 in bilateral upper and lower extremities. Sensation to light touch intact throughout.  Skin: She is not diaphoretic.     Labs on Admission: I have personally reviewed following labs and imaging studies  CBC: Recent Labs  Lab 10/25/19 1535  WBC 3.9*  NEUTROABS 1.1*  HGB 11.6*  HCT 38.2  MCV 80.8  PLT 0000000*   Basic Metabolic Panel: Recent Labs  Lab 10/25/19 1535  NA 141  K 4.2  CL 114*  CO2 18*  GLUCOSE 133*  BUN 10    CREATININE 1.00  CALCIUM 8.1*   GFR: Estimated Creatinine Clearance: 78 mL/min (by C-G formula based on SCr of 1 mg/dL). Liver Function Tests: Recent Labs  Lab 10/25/19 1535  AST 25  ALT 26  ALKPHOS 105  BILITOT 0.7  PROT 6.3*  ALBUMIN 2.6*   Recent Labs  Lab 10/25/19 1535  LIPASE 16   Recent Labs  Lab 10/25/19 1535  AMMONIA 13   Coagulation Profile: Recent Labs  Lab 10/25/19 1535  INR 1.4*   Cardiac Enzymes: No results for input(s): CKTOTAL, CKMB, CKMBINDEX, TROPONINI in the last 168 hours. BNP (last 3 results) No results for input(s): PROBNP in  the last 8760 hours. HbA1C: No results for input(s): HGBA1C in the last 72 hours. CBG: No results for input(s): GLUCAP in the last 168 hours. Lipid Profile: No results for input(s): CHOL, HDL, LDLCALC, TRIG, CHOLHDL, LDLDIRECT in the last 72 hours. Thyroid Function Tests: No results for input(s): TSH, T4TOTAL, FREET4, T3FREE, THYROIDAB in the last 72 hours. Anemia Panel: No results for input(s): VITAMINB12, FOLATE, FERRITIN, TIBC, IRON, RETICCTPCT in the last 72 hours. Urine analysis: No results found for: COLORURINE, APPEARANCEUR, LABSPEC, PHURINE, GLUCOSEU, HGBUR, BILIRUBINUR, KETONESUR, PROTEINUR, UROBILINOGEN, NITRITE, LEUKOCYTESUR  Radiological Exams on Admission: CT Head Wo Contrast  Result Date: 10/25/2019 CLINICAL DATA:  Speech difficulty, aphasia history of liver cancer EXAM: CT HEAD WITHOUT CONTRAST TECHNIQUE: Contiguous axial images were obtained from the base of the skull through the vertex without intravenous contrast. COMPARISON:  None. FINDINGS: Brain: Hypodensity/edema within the right temporal lobe and insula. Negative for hemorrhage. No midline shift. The ventricles are nonenlarged. Vascular: No hyperdense vessels. Scattered calcifications at the carotid siphons. Vertebral artery calcification. Skull: Normal. Negative for fracture or focal lesion. Sinuses/Orbits: No acute finding. Other: None IMPRESSION:  1. Hypodensity/edema within the right temporal lobe and posterior insula, favor acute to subacute infarct over mass/metastatic disease, though MRI would be more definitive. 2. Negative for hemorrhage, midline shift or significant mass effect. Electronically Signed   By: Donavan Foil M.D.   On: 10/25/2019 16:18   MR BRAIN WO CONTRAST  Result Date: 10/25/2019 CLINICAL DATA:  Slurred speech, abnormal CT EXAM: MRI HEAD WITHOUT CONTRAST TECHNIQUE: Multiplanar, multiecho pulse sequences of the brain and surrounding structures were obtained without intravenous contrast. COMPARISON:  Correlation made with same day CT FINDINGS: Brain: There is cortical/subcortical reduced diffusion in the right MCA territory primarily involving the temporal lobe and posterior insula with some additional parietal greater than frontal involvement. Contralateral foci of reduced diffusion are also present in the left frontal lobe. A few small foci are present within the right cerebellar hemisphere. No evidence of hemorrhage. There is no intracranial mass or significant mass effect. There is no hydrocephalus or extra-axial fluid collection. Ventricles and sulci are normal in size and configuration. Vascular: Major vessel flow voids at the skull base are preserved. Skull and upper cervical spine: Normal marrow signal is preserved. Sinuses/Orbits: Trace mucosal thickening.  Orbits are unremarkable. Other: Sella is unremarkable.  Mastoid air cells are clear. IMPRESSION: Acute and more subacute appearing infarctions involving the right much greater than left MCA territories. There is also minor involvement of the right cerebellum. No evidence of hemorrhage. Electronically Signed   By: Macy Mis M.D.   On: 10/25/2019 19:46   DG Chest Port 1 View  Result Date: 10/25/2019 CLINICAL DATA:  Tachycardia EXAM: PORTABLE CHEST 1 VIEW COMPARISON:  07/27/2019 FINDINGS: Right chest wall port catheter tip overlies the SVC. Persistent elevation of the  right hemidiaphragm. No new consolidation or edema. No pleural effusion or pneumothorax. Cardiomediastinal contours are stable. IMPRESSION: No acute process in the chest. Electronically Signed   By: Macy Mis M.D.   On: 10/25/2019 15:25    EKG: Independently reviewed.  Sinus tachycardia, heart rate 152.  Nonspecific ST abnormality.  QTc 508.  Assessment/Plan Principal Problem:   Acute CVA (cerebrovascular accident) [facility]) Active Problems:   Adenocarcinoma of unknown primary ([place])   Lactic acidosis   Metabolic acidosis   Anemia   Acute CVA: Brain MRI showing acute and more subacute appearing infarctions involving the right much greater than left MCA territories.  Also  showing minor involvement of the right cerebellum.  Patient has been seen by neurology, outside TPA window.  Neurology feels her stroke is likely secondary to hypercoagulability. -Telemetry monitoring -CTA head and neck -2D echocardiogram -Hemoglobin A1c, fasting lipid panel -Aspirin 325 mg daily -Atorvastatin 80 mg daily -Frequent neurochecks -PT, OT, speech therapy. -N.p.o. until cleared by bedside swallow evaluation or formal speech evaluation -Neurology recommending allowing for permissive hypertension for another day or so and then start normalizing blood pressures. -Might need anticoagulation given history of malignancy and possible hypercoagulable state.  Hypercoagulable work-up panel has been ordered by neurology, final decision after lab work.  Mild lactic acidosis: Suspect due to dehydration.  No fever or leukocytosis to suggest underlying infection.  Chest x-ray not suggestive of pneumonia.  UA pending.  Was initially tachycardic on arrival with soft blood pressure.  Vitals improved after IV fluid bolus.  Repeat lactic acid level.  Sinus tachycardia: Per review with cardiology documentation, it appears that patient has been running sinus tach intermittently up to the 120s since at least December 2020.  TSH  checked 07/23/2019 was normal.  Hemoglobin not significantly changed from baseline.  She was started on a trial of beta-blocker by cardiology but per pharmacy med rec review it appears that patient has not been taking this medication.  Will resume home metoprolol 25 mg twice daily.  Mild normal anion gap metabolic acidosis: Not endorsing diarrhea and not on a diuretic. Continue IV fluid hydration repeat labs in a.m.  Recently diagnosed cholangiocarcinoma with metastasis: Followed by oncology at [facility].  Currently on chemo.  Please ensure oncology follow-up.  Anemia: Suspect related to chemotherapy.  Hemoglobin 11.6, stable compared to previous labs from 10/11/2019 and 10/15/2019 under care everywhere.  Thrombocytosis: Platelet count 518.  Continue to monitor and ensure oncology follow-up.  QT prolongation EKG: Continue cardiac monitoring.  Keep potassium above 4 and magnesium above 2.  Repeat EKG in a.m.  Avoid QT prolonging drugs if possible.  DVT prophylaxis: Lovenox Code Status: Full code Family Communication: No family available at this time. Disposition Plan: Anticipate discharge after full stroke work-up has been done.  Patient will likely need to stay in the hospital for greater than 2 midnights. Consults called: Neurology Admission status: It is my clinical opinion that admission to INPATIENT is reasonable and necessary because of the expectation that this patient will require hospital care that crosses at least 2 midnights to treat this condition based on the medical complexity of the problems presented.  Given the aforementioned information, the predictability of an adverse outcome is felt to be significant.  The medical decision making on this patient was of high complexity and the patient is at high risk for clinical deterioration, therefore this is a level 3 visit.  Shela Leff MD [facility]  If 7PM-7AM, please contact  night-coverage [email or web address]  10/25/2019, 9:57 PM

## 2019-10-25 NOTE — ED Notes (Signed)
CRITICAL LAB- Lactic acid 2.2 Raquel Sarna RN

## 2019-10-25 NOTE — ED Triage Notes (Signed)
Pt here with husband for slurred speech x 3 days. Decreased mobility x 2 weeks, with fall 2 weeks ago that patient does not remember. Pt had slurred speech episode one week ago and symptoms resolved but recurred three days ago. Pt A/O x 3. Currently receiving chemo for liver cancer.

## 2019-10-25 NOTE — ED Notes (Signed)
Pt given apple sauce & sprite.

## 2019-10-25 NOTE — Consult Note (Signed)
Neurology Consultation  Reason for Consult: Stroke Referring Physician: Dr. Langston Masker  CC: Dysarthria, stroke on MRI  History is obtained from: Chart review  HPI: Melissa Hurley is a 54 y.o. female recently diagnosed with cholangiocarcinoma with metastases, currently undergoing chemotherapy, presenting for 3 days of slurred and incomprehensible speech.  Work-up in the ER concerning for strokelike symptoms for which she got an MRI of the brain that showed bilateral cerebral and cerebellar infarcts. Neurological consultation for further management. She does not report of headache but reports of difficulty being able to communicate.  She is mimicking and trying to use sign language more than anything because she is unable to make full sentences.  She is able to comprehend normally. See detailed exam below. Family member who accompanied her was not at the bedside at the time of this encounter. Denies headache.  Denies chest pain nausea vomiting.  Denies shortness of breath.    LKW: 3 days ago tpa given?: no, outside the window Premorbid modified Rankin scale (mRS): 0  ROS: Performed and negative except noted in HPI.  Past Medical History:  Diagnosis Date  . Cancer ([place])    Liver  . Cholangiocarcinoma ([place])   . Metastasis ([place])   . Pericardial effusion   . Sinus tachycardia   . Uterine fibroid      Family History  Problem Relation Age of Onset  . Breast cancer Mother        24's  . Ovarian cancer Mother   . Lung cancer Mother        37s  . Ovarian cancer Maternal Aunt   . Breast cancer Maternal Grandmother   . Breast cancer Cousin        maternal, dx50+  . Colon cancer Neg Hx   . Esophageal cancer Neg Hx   . Rectal cancer Neg Hx   . Stomach cancer Neg Hx      Social History:   reports that she has never smoked. She has never used smokeless tobacco. She reports that she does not drink alcohol or use drugs.   Medications  Current Facility-Administered Medications:   .  aspirin EC tablet 325 mg, 325 mg, Oral, Once, Carlisle Cater, PA-C .  Chlorhexidine Gluconate Cloth 2 % PADS 6 each, 6 each, Topical, Daily, Camnitz, Will Hassell Done, MD .  ondansetron (ZOFRAN) 4 mg in sodium chloride 0.9 % 50 mL IVPB, 4 mg, Intravenous, Once, Milus Banister, MD .  sodium chloride flush (NS) 0.9 % injection 10-40 mL, 10-40 mL, Intracatheter, Q12H, Camnitz, Will Hassell Done, MD .  sodium chloride flush (NS) 0.9 % injection 10-40 mL, 10-40 mL, Intracatheter, PRN, Camnitz, Will Hassell Done, MD  Current Outpatient Medications:  .  calcium citrate-vitamin D (CITRACAL+D) 315-200 MG-UNIT tablet, Take 1 tablet by mouth in the morning and at bedtime., Disp: , Rfl:  .  dexamethasone (DECADRON) 4 MG tablet, Take 8 mg by mouth See admin instructions. Take 8 mg (2 tablets) by mouth with breakfast on days 2 and 3 of each treatment cycle, Disp: , Rfl:  .  fluconazole (DIFLUCAN) 100 MG tablet, Take 100 mg by mouth daily., Disp: , Rfl:  .  LORazepam (ATIVAN) 1 MG tablet, Take 1 mg by mouth every 8 (eight) hours as needed for anxiety. , Disp: , Rfl:  .  megestrol (MEGACE) 40 MG/ML suspension, Take 400 mg by mouth 2 (two) times daily. , Disp: , Rfl:  .  nystatin (MYCOSTATIN) 100000 UNIT/ML suspension, Take 5 mLs by mouth every 4 (  four) hours as needed (as directed). , Disp: , Rfl:  .  ondansetron (ZOFRAN) 8 MG tablet, Take 8 mg by mouth every 8 (eight) hours as needed for nausea. , Disp: , Rfl:  .  pantoprazole (PROTONIX) 40 MG tablet, Take 40 mg by mouth daily before breakfast. , Disp: , Rfl:  .  prochlorperazine (COMPAZINE) 10 MG tablet, Take 10 mg by mouth every 8 (eight) hours as needed for nausea or vomiting., Disp: , Rfl:  .  metoprolol tartrate (LOPRESSOR) 25 MG tablet, Take 1 tablet (25 mg total) by mouth 2 (two) times daily. (Patient not taking: Reported on 10/25/2019), Disp: 60 tablet, Rfl: 3 .  PARoxetine (PAXIL) 10 MG tablet, Take 10 mg by mouth daily., Disp: , Rfl:    Exam: Current vital  signs: BP 117/74   Pulse (!) 106   Temp 98.4 F (36.9 C) (Oral)   Resp (!) 29   Ht 5\' 7"  (1.702 m)   Wt 95.3 kg   LMP 10/25/2014   SpO2 100%   BMI 32.89 kg/m  Vital signs in last 24 hours: Temp:  [98.4 F (36.9 C)] 98.4 F (36.9 C) (03/08 1432) Pulse Rate:  [101-152] 106 (03/08 1845) Resp:  [16-29] 29 (03/08 1500) BP: (95-117)/(68-74) 117/74 (03/08 1500) SpO2:  [99 %-100 %] 100 % (03/08 1845) Weight:  [95.3 kg] 95.3 kg (03/08 1430)  GENERAL: Awake, alert in NAD HEENT: - Normocephalic and atraumatic, dry mm, no LN++, no Thyromegally LUNGS - Clear to auscultation bilaterally with no wheezes CV - S1S2 RRR, no m/r/g, equal pulses bilaterally. ABDOMEN - Soft, nontender, nondistended with normoactive BS Ext: warm, well perfused, intact peripheral pulses no edema  NEURO:  Mental Status: Awake alert oriented x3. Language: speech is moderately dysarthric.  Naming mildly impaired, repetition is intact, fluency is also impaired, but comprehension intact. Cranial Nerves: PERRL. EOMI, visual fields full, no facial asymmetry, facial sensation intact, hearing intact, tongue/uvula/soft palate midline, normal sternocleidomastoid and trapezius muscle strength. No evidence of tongue atrophy or fibrillations Motor: Antigravity in all fours Tone: is normal and bulk is normal Sensation- Intact to light touch bilaterally Coordination: FTN intact bilaterally Gait- deferred  NIHSS-3 Labs I have reviewed labs in epic and the results pertinent to this consultation are:  CBC    Component Value Date/Time   WBC 3.9 (L) 10/25/2019 1535   RBC 4.73 10/25/2019 1535   HGB 11.6 (L) 10/25/2019 1535   HGB 12.6 08/27/2019 0905   HCT 38.2 10/25/2019 1535   PLT 518 (H) 10/25/2019 1535   PLT 229 08/27/2019 0905   MCV 80.8 10/25/2019 1535   MCH 24.5 (L) 10/25/2019 1535   MCHC 30.4 10/25/2019 1535   RDW 18.7 (H) 10/25/2019 1535   LYMPHSABS 1.7 10/25/2019 1535   MONOABS 1.0 10/25/2019 1535   EOSABS 0.0  10/25/2019 1535   BASOSABS 0.0 10/25/2019 1535    CMP     Component Value Date/Time   NA 141 10/25/2019 1535   K 4.2 10/25/2019 1535   CL 114 (H) 10/25/2019 1535   CO2 18 (L) 10/25/2019 1535   GLUCOSE 133 (H) 10/25/2019 1535   BUN 10 10/25/2019 1535   CREATININE 1.00 10/25/2019 1535   CREATININE 0.93 08/27/2019 0905   CALCIUM 8.1 (L) 10/25/2019 1535   PROT 6.3 (L) 10/25/2019 1535   ALBUMIN 2.6 (L) 10/25/2019 1535   AST 25 10/25/2019 1535   AST 19 08/27/2019 0905   ALT 26 10/25/2019 1535   ALT 11 08/27/2019 0905  ALKPHOS 105 10/25/2019 1535   BILITOT 0.7 10/25/2019 1535   BILITOT 0.9 08/27/2019 0905   GFRNONAA >60 10/25/2019 1535   GFRNONAA >60 08/27/2019 0905   GFRAA >60 10/25/2019 1535   GFRAA >60 08/27/2019 0905    Imaging I have reviewed the images obtained: Acute and more subacute infarcts involving the right much greater than left MCA territories.  Also punctate infarcts in the right cerebellum.  No evidence of hemorrhage.  Assessment: Patient with history of cholangiocarcinoma with metastasis presenting with 3 days history of difficulty with speech-slurred speech and word finding difficulty noted to have right greater than left MCA territory infarcts and also punctate infarct in the right cerebellum. Likely secondary to hypercoagulability. Needs further work-up. Outside the window for TPA or intervention at this time.   Recommendations: Admit to hospitalist Telemetry Frequent neurochecks CTA head and neck 2D echo A1c Admit panel PT OT speech therapy Aspirin 325 and atorvastatin 80 Allow for permissive hypertension for another day or so and then start normalizing blood pressures. Might need anticoagulation given the history of malignancy and possible hypercoagulable state. Final decision after lab work-ordering hypercoagulable panel.   -- Amie Portland, MD [facility] Pager: [phone] If 7pm to 7am, please call on call as listed on  AMION.

## 2019-10-25 NOTE — ED Provider Notes (Signed)
Melissa Hurley   CSN: [id number] Arrival date & time: 10/25/19  1422     History Chief Complaint  Patient presents with  . Aphasia    Melissa Hurley is a 54 y.o. female.  Patient with history of intrahepatic cholangiocarcinoma diagnosed two and half months ago with metastatic spread followed at [facility], currently undergoing chemotherapy, last treatment was 2 weeks ago presents the emergency department today with slurring and incomprehensible speech for the past 3 days.  Patient has been less mobile and more weak over the past 2 weeks.  Patient is off balance with walking and needs to hold onto objects to help her get around.  Husband states that her speech was slurred about a week ago however transiently improved before becoming worse again.  Patient has been eating and drinking less.  She denies headache, fevers, URI symptoms.  No chest pain or shortness of breath.  No lower extremity swelling.  Husband spoke with her oncologist today and was encouraged to come immediately to the emergency department.        Past Medical History:  Diagnosis Date  . Cancer ([place])    Liver  . Cholangiocarcinoma ([place])   . Metastasis ([place])   . Pericardial effusion   . Sinus tachycardia   . Uterine fibroid     Patient Active Problem List   Diagnosis Date Noted  . Adenocarcinoma of unknown primary ([place]) 08/27/2019  . Fibroid uterus 08/27/2019    Past Surgical History:  Procedure Laterality Date  . CESAREAN SECTION       OB History    Gravida  2   Para  1   Term      Preterm      AB  1   Living        SAB  1   TAB      Ectopic      Multiple      Live Births              Family History  Problem Relation Age of Onset  . Breast cancer Mother        27's  . Ovarian cancer Mother   . Lung cancer Mother        82s  . Ovarian cancer Maternal Aunt   . Breast cancer Maternal Grandmother   . Breast cancer  Cousin        maternal, dx50+  . Colon cancer Neg Hx   . Esophageal cancer Neg Hx   . Rectal cancer Neg Hx   . Stomach cancer Neg Hx     Social History   Tobacco Use  . Smoking status: Never Smoker  . Smokeless tobacco: Never Used  Substance Use Topics  . Alcohol use: Never  . Drug use: Never    Home Medications Prior to Admission medications   Medication Sig Start Date End Date Taking? Authorizing Provider  megestrol (MEGACE) 40 MG/ML suspension Take 20 mLs by mouth daily. 09/20/19   [provider]  metoprolol tartrate (LOPRESSOR) 25 MG tablet Take 1 tablet (25 mg total) by mouth 2 (two) times daily. 09/22/19 12/21/19  Dunn, Nedra Hai, PA-C  ondansetron (ZOFRAN) 8 MG tablet Take 1 tablet by mouth every 8 (eight) hours as needed. nausea 09/20/19   [provider]  prochlorperazine (COMPAZINE) 10 MG tablet Take 10 mg by mouth every 8 (eight) hours as needed for nausea or vomiting.    [provider]    Allergies    Patient has no known allergies.  Review of Systems   Review of Systems  Constitutional: Negative for fever.  HENT: Negative for rhinorrhea and sore throat.   Eyes: Negative for redness.  Respiratory: Negative for cough.   Cardiovascular: Negative for chest pain.  Gastrointestinal: Negative for abdominal pain, diarrhea, nausea and vomiting.  Genitourinary: Negative for dysuria and hematuria.  Musculoskeletal: Negative for myalgias.  Skin: Negative for rash.  Neurological: Positive for dizziness, speech difficulty and weakness. Negative for seizures, facial asymmetry, numbness and headaches.    Physical Exam Updated Vital Signs BP 117/74   Pulse (!) 144   Temp 98.4 F (36.9 C) (Oral)   Resp (!) 29   Ht 5\' 7"  (1.702 m)   Wt 95.3 kg   LMP 10/25/2014   SpO2 100%   BMI 32.89 kg/m   Physical Exam Vitals and nursing Hurley reviewed.  Constitutional:      Appearance: She is well-developed.  HENT:     Head: Normocephalic and atraumatic.       Right Ear: Tympanic membrane, ear canal and external ear normal.     Left Ear: Tympanic membrane, ear canal and external ear normal.     Nose: Nose normal.     Mouth/Throat:     Mouth: Mucous membranes are dry.     Pharynx: Uvula midline.  Eyes:     General: Lids are normal.        Right eye: No discharge.        Left eye: No discharge.     Extraocular Movements:     Right eye: No nystagmus.     Left eye: No nystagmus.     Conjunctiva/sclera: Conjunctivae normal.     Pupils: Pupils are equal, round, and reactive to light.  Cardiovascular:     Rate and Rhythm: Regular rhythm. Tachycardia present.     Heart sounds: Normal heart sounds.  Pulmonary:     Effort: Pulmonary effort is normal.     Breath sounds: Normal breath sounds.  Abdominal:     Palpations: Abdomen is soft.     Tenderness: There is no abdominal tenderness.  Musculoskeletal:     Cervical back: Normal range of motion and neck supple. No tenderness or bony tenderness.  Skin:    General: Skin is warm and dry.  Neurological:     Mental Status: She is alert.     GCS: GCS eye subscore is 4. GCS verbal subscore is 5. GCS motor subscore is 6.     Cranial Nerves: Cranial nerve deficit present.     Sensory: No sensory deficit.     Motor: No weakness.     Coordination: Coordination normal.     Deep Tendon Reflexes: Reflexes are normal and symmetric.     Comments: Question slight drooping of the right side of the face with smiling.  Patient with extremely thick and slurred speech, mostly incomprehensible when asked to say "if answer bites" or tell me her address.  Normal alternating finger movements and heel to shin.      ED Results / Procedures / Treatments   Labs (all labs ordered are listed, but only abnormal results are displayed) Labs Reviewed  CBC WITH DIFFERENTIAL/PLATELET - Abnormal; Notable for the following components:      Result Value   WBC 3.9 (*)    Hemoglobin 11.6 (*)    MCH 24.5 (*)    RDW 18.7 (*)  Platelets 518 (*)    Neutro Abs 1.1 (*)    All other components within normal limits  COMPREHENSIVE METABOLIC PANEL - Abnormal; Notable for the following components:   Chloride 114 (*)    CO2 18 (*)    Glucose, Bld 133 (*)    Calcium 8.1 (*)    Total Protein 6.3 (*)    Albumin 2.6 (*)    All other components within normal limits  LACTIC ACID, PLASMA - Abnormal; Notable for the following components:   Lactic Acid, Venous 2.2 (*)    All other components within normal limits  PROTIME-INR - Abnormal; Notable for the following components:   Prothrombin Time 16.6 (*)    INR 1.4 (*)    All other components within normal limits  SARS CORONAVIRUS 2 (TAT 6-24 HRS)  LIPASE, BLOOD  AMMONIA  URINALYSIS, ROUTINE W REFLEX MICROSCOPIC    EKG EKG Interpretation  Date/Time:  Monday October 25 2019 14:27:18 EST Ventricular Rate:  152 PR Interval:  130 QRS Duration: 58 QT Interval:  320 QTC Calculation: 508 R Axis:   73 Text Interpretation: Sinus tachycardia Nonspecific ST abnormality Abnormal ECG No STEMI Confirmed by Octaviano Glow [phone]) on 10/25/2019 3:04:36 PM   Radiology DG Chest Port 1 View  Result Date: 10/25/2019 CLINICAL DATA:  Tachycardia EXAM: PORTABLE CHEST 1 VIEW COMPARISON:  07/27/2019 FINDINGS: Right chest wall port catheter tip overlies the SVC. Persistent elevation of the right hemidiaphragm. No new consolidation or edema. No pleural effusion or pneumothorax. Cardiomediastinal contours are stable. IMPRESSION: No acute process in the chest. Electronically Signed   By: Macy Mis M.D.   On: 10/25/2019 15:25    Procedures Procedures (including critical care time)  Medications Ordered in ED Medications  sodium chloride flush (NS) 0.9 % injection 10-40 mL (has no administration in time range)  sodium chloride flush (NS) 0.9 % injection 10-40 mL (has no administration in time range)  Chlorhexidine Gluconate Cloth 2 % PADS 6 each (has no administration in time range)   aspirin EC tablet 325 mg (has no administration in time range)  sodium chloride 0.9 % bolus 1,000 mL (0 mLs Intravenous Stopped 10/25/19 1857)    ED Course  I have reviewed the triage vital signs and the nursing notes.  Pertinent labs & imaging results that were available during my care of the patient were reviewed by me and considered in my medical decision making (see chart for details).  Patient seen and examined.  Patient with significant dysarthria, concerning for central neurological cause.  Will need CT head to evaluate for metastasis, stroke, edema.  Lab work-up undertaken.  Patient is tachycardic and hypotensive.  She does seem to be dry on exam.  Will monitor as IV fluids were given.  Patient discussed with Dr. Langston Masker.   Vital signs reviewed and are as follows: BP 117/74   Pulse (!) 144   Temp 98.4 F (36.9 C) (Oral)   Resp (!) 29   Ht 5\' 7"  (1.702 m)   Wt 95.3 kg   LMP 10/25/2014   SpO2 100%   BMI 32.89 kg/m   4:39 PM CT reviewed with Dr. Langston Masker. Pt and husband updated. MRI ordered to delineate differentials.   8:34 PM MRI has been performed.  Patient has multiple areas of acute to subacute stroke.  I have updated the patient and husband on results.  Discussed plan for admission.  I spoke with Dr. Rory Percy. He will see patient. Requests hospitalist admit and to administer  324mg  ASA, ordered.   Pt has passed swallow screen and will be allowed to eat.   8:51 PM Dr. Rory Percy has seen.    Clinical Course as of Oct 24 1637  Mon Oct 25, 2019  1620 Patient was seen by myself as well as PA provider.  Briefly 54 year old female with a history of cancer of unclear primary undergoing chemotherapy, presented to emergency department with expressive aphasia for about a week.  She reportedly developed symptoms a week ago which improved and then resumed approximately 3 days ago.  On exam the patient is not in acute distress.  She does have expressive aphasia and mildly slurred speech.   She has no other objective neurological deficits.  Her strength is intact.  She is pending stroke/metastasis CT scan of the head.   [MT]    Clinical Course User Index [MT] Trifan, Carola Rhine, MD   MDM Rules/Calculators/A&P                      Admit.    Final Clinical Impression(s) / ED Diagnoses Final diagnoses:  Expressive aphasia  Dysarthria  Cerebrovascular accident (CVA) due to bilateral embolism of middle cerebral arteries [facility])    Rx / DC Orders ED Discharge Orders    None       Carlisle Cater, Hershal Coria 10/25/19 2051    Wyvonnia Dusky, MD 10/26/19 [phone]

## 2019-10-25 NOTE — ED Notes (Signed)
Pt transported to MRI 

## 2019-10-26 ENCOUNTER — Inpatient Hospital Stay (HOSPITAL_COMMUNITY): Payer: BC Managed Care – PPO

## 2019-10-26 DIAGNOSIS — I639 Cerebral infarction, unspecified: Secondary | ICD-10-CM

## 2019-10-26 DIAGNOSIS — C801 Malignant (primary) neoplasm, unspecified: Secondary | ICD-10-CM

## 2019-10-26 DIAGNOSIS — I63413 Cerebral infarction due to embolism of bilateral middle cerebral arteries: Secondary | ICD-10-CM

## 2019-10-26 DIAGNOSIS — R4701 Aphasia: Secondary | ICD-10-CM

## 2019-10-26 DIAGNOSIS — I6389 Other cerebral infarction: Secondary | ICD-10-CM

## 2019-10-26 LAB — HEMOGLOBIN A1C
Hgb A1c MFr Bld: 5.7 % — ABNORMAL HIGH (ref 4.8–5.6)
Mean Plasma Glucose: 116.89 mg/dL

## 2019-10-26 LAB — LIPID PANEL
Cholesterol: 108 mg/dL (ref 0–200)
HDL: 14 mg/dL — ABNORMAL LOW (ref >40)
LDL Cholesterol: 55 mg/dL (ref 0–99)
Total CHOL/HDL Ratio: 7.7 RATIO
Triglycerides: 194 mg/dL — ABNORMAL HIGH (ref <150)
VLDL: 39 mg/dL (ref 0–40)

## 2019-10-26 LAB — ECHOCARDIOGRAM COMPLETE
Height: 67 in
Weight: 3360 oz

## 2019-10-26 LAB — SARS CORONAVIRUS 2 (TAT 6-24 HRS): SARS Coronavirus 2: NEGATIVE

## 2019-10-26 LAB — MAGNESIUM: Magnesium: 1.7 mg/dL (ref 1.7–2.4)

## 2019-10-26 LAB — URINALYSIS, ROUTINE W REFLEX MICROSCOPIC
Bilirubin Urine: NEGATIVE
Glucose, UA: NEGATIVE mg/dL
Ketones, ur: NEGATIVE mg/dL
Nitrite: POSITIVE — AB
Protein, ur: NEGATIVE mg/dL
Specific Gravity, Urine: <1.005 — ABNORMAL LOW (ref 1.005–1.030)
pH: 6.5 (ref 5.0–8.0)

## 2019-10-26 LAB — URINALYSIS, MICROSCOPIC (REFLEX)

## 2019-10-26 LAB — CBC
HCT: 33.4 % — ABNORMAL LOW (ref 36.0–46.0)
Hemoglobin: 10 g/dL — ABNORMAL LOW (ref 12.0–15.0)
MCH: 24 pg — ABNORMAL LOW (ref 26.0–34.0)
MCHC: 29.9 g/dL — ABNORMAL LOW (ref 30.0–36.0)
MCV: 80.3 fL (ref 80.0–100.0)
Platelets: 486 10*3/uL — ABNORMAL HIGH (ref 150–400)
RBC: 4.16 MIL/uL (ref 3.87–5.11)
RDW: 18.5 % — ABNORMAL HIGH (ref 11.5–15.5)
WBC: 4.2 10*3/uL (ref 4.0–10.5)
nRBC: 0 % (ref 0.0–0.2)

## 2019-10-26 LAB — LACTIC ACID, PLASMA: Lactic Acid, Venous: 1.8 mmol/L (ref 0.5–1.9)

## 2019-10-26 LAB — HIV ANTIBODY (ROUTINE TESTING W REFLEX): HIV Screen 4th Generation wRfx: NONREACTIVE

## 2019-10-26 LAB — ANTITHROMBIN III: AntiThromb III Func: 81 % (ref 75–120)

## 2019-10-26 NOTE — Evaluation (Signed)
Occupational Therapy Evaluation Patient Details Name: Melissa Hurley MRN: [id number] DOB: [birth year] Today's Date: 10/26/2019    History of Present Illness 53 y.o. female recently diagnosed with cholangiocarcinoma with metastases, currently undergoing chemotherapy, presented to ED with slurred and incomprehensible speech.  Brain MRI 3/8: Acute and more subacute appearing infarctions involving the right much greater than left MCA territories. There is also minor involvement of the right cerebellum   Clinical Impression   PT admitted with L MCA , R cerebellum and R MCA. Pt currently with functional limitiations due to the deficits listed below (see OT problem list). Pt currently with expressive speech deficits but able to write or spell with sign language. Pt noted to have bruises on L side from recent fall off bed. Ot to attempt higher executive function task and balance task.  Pt fatigues so quickly that energy conservation will be addressed. Pt will benefit from skilled OT to increase their independence and safety with adls and balance to allow discharge hhot.     Follow Up Recommendations  Home health OT    Equipment Recommendations  None recommended by OT    Recommendations for Other Services       Precautions / Restrictions Precautions Precautions: Fall Precaution Comments: reports fall within last 2 weeks off the bed Restrictions Weight Bearing Restrictions: No      Mobility Bed Mobility Overal bed mobility: Modified Independent             General bed mobility comments: increased time exiting on R side  Transfers Overall transfer level: Needs assistance Equipment used: Rolling walker (2 wheeled) Transfers: Sit to/from Stand Sit to Stand: Min assist         General transfer comment: pt pulling up on Rw.    Balance Overall balance assessment: Needs assistance Sitting-balance support: No upper extremity supported;Feet supported Sitting balance-Leahy Scale:  Good     Standing balance support: Bilateral upper extremity supported;During functional activity Standing balance-Leahy Scale: Fair                             ADL either performed or assessed with clinical judgement   ADL Overall ADL's : Needs assistance/impaired Eating/Feeding: Supervision/ safety;Sitting Eating/Feeding Details (indicate cue type and reason): reports x1 week with nausea and poor eating. pt reports having "Trush" Grooming: Wash/dry hands;Min guard;Standing   Upper Body Bathing: Moderate assistance;Sitting           Lower Body Dressing: Minimal assistance;Sit to/from stand Lower Body Dressing Details (indicate cue type and reason): extended time and rest break between don of socks Toilet Transfer: Min guard           Functional mobility during ADLs: Min guard General ADL Comments: pt reports fatigue with minimal activty. spouse states "she is just so tired"     Vision Baseline Vision/History: No visual deficits       Perception     Praxis      Pertinent Vitals/Pain Pain Assessment: Faces Pain Score: 2  Faces Pain Scale: Hurts a little bit Pain Location: generalized Pain Descriptors / Indicators: Discomfort Pain Intervention(s): Monitored during session     Hand Dominance Right   Extremity/Trunk Assessment Upper Extremity Assessment Upper Extremity Assessment: Overall WFL for tasks assessed   Lower Extremity Assessment Lower Extremity Assessment: Overall WFL for tasks assessed   Cervical / Trunk Assessment Cervical / Trunk Assessment: Normal   Communication Communication Communication: Expressive difficulties   Cognition Arousal/Alertness:  Awake/alert Behavior During Therapy: WFL for tasks assessed/performed Overall Cognitive Status: Within Functional Limits for tasks assessed                                     General Comments  nauseated with sitting, reports feeling "hot"    Exercises      Shoulder Instructions      Home Living Family/patient expects to be discharged to:: Private residence Living Arrangements: Spouse/significant other;Children(12 yo) Available Help at Discharge: Family Type of Home: House Home Access: Stairs to enter Technical brewer of Steps: 3 Entrance Stairs-Rails: Can reach both Home Layout: Two level;Bed/bath upstairs   Alternate Level Stairs-Rails: Right Bathroom Shower/Tub: Occupational psychologist: Standard     Home Equipment: None   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[employer] -   Lives With: Spouse;Daughter    Prior Functioning/Environment Level of Independence: Independent                 OT Problem List: Decreased strength;Decreased activity tolerance;Impaired balance (sitting and/or standing);Decreased cognition;Decreased safety awareness;Decreased knowledge of use of DME or AE;Decreased knowledge of precautions;Obesity      OT Treatment/Interventions: Self-care/ADL training;Therapeutic exercise;Neuromuscular education;Energy conservation;DME and/or AE instruction;Manual therapy;Therapeutic activities;Patient/family education;Balance training    OT Goals(Current goals can be found in the care plan section) Acute Rehab OT Goals 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 OT Goal Formulation: With patient/family Time For Goal Achievement: 11/09/19 Potential to Achieve Goals: Good  OT Frequency: Min 3X/week   Barriers to D/C:            Co-evaluation PT/OT/SLP Co-Evaluation/Treatment: Yes Reason for Co-Treatment: For patient/therapist safety          AM-PAC OT "6 Clicks" Daily Activity     Outcome Measure Help from another person eating meals?: None Help from another person taking care of personal grooming?: A Little Help from another person toileting, which includes using toliet, bedpan, or urinal?: A Little Help from another person bathing (including  washing, rinsing, drying)?: A Little Help from another person to put on and taking off regular upper body clothing?: A Little Help from another person to put on and taking off regular lower body clothing?: A Little 6 Click Score: 19   End of Session Equipment Utilized During Treatment: Gait belt;Rolling walker Nurse Communication: Mobility status;Precautions  Activity Tolerance: Patient tolerated treatment well Patient left: in bed;with call bell/phone within reach;with bed alarm set;with family/visitor present  OT Visit Diagnosis: Unsteadiness on feet (R26.81)                Time: [id number] OT Time Calculation (min): 32 min Charges:  OT General Charges $OT Visit: 1 Visit OT Evaluation $OT Eval Moderate Complexity: 1 Mod   Brynn, OTR/L  Acute Rehabilitation Services Pager: [phone] Office: [phone] .   Jeri Modena 10/26/2019, 1:19 PM

## 2019-10-26 NOTE — Progress Notes (Signed)
STROKE TEAM PROGRESS NOTE   INTERVAL HISTORY Patient lying in bed, no family at bedside.  She still has dysarthria more than expressive aphasia.  Able to follow all simple commands.  Moving all extremities symmetrically.  LE venous Doppler no DVT.  Vitals:   10/26/19 0322 10/26/19 0400 10/26/19 0756 10/26/19 1235  BP: 109/75 120/71 (!) 139/95 103/68  Pulse:   91 90  Resp: 16 (!) 21 20 20   Temp: 98 F (36.7 C) 98.8 F (37.1 C)  99.1 F (37.3 C)  TempSrc: Oral Oral Tympanic   SpO2:   100% 100%  Weight:      Height:        CBC:  Recent Labs  Lab 10/25/19 1535 10/26/19 0430  WBC 3.9* 4.2  NEUTROABS 1.1*  --   HGB 11.6* 10.0*  HCT 38.2 33.4*  MCV 80.8 80.3  PLT 518* 486*    Basic Metabolic Panel:  Recent Labs  Lab 10/25/19 1535 10/26/19 0430  NA 141  --   K 4.2  --   CL 114*  --   CO2 18*  --   GLUCOSE 133*  --   BUN 10  --   CREATININE 1.00  --   CALCIUM 8.1*  --   MG  --  1.7   Lipid Panel:     Component Value Date/Time   CHOL 108 10/26/2019 0430   TRIG 194 (H) 10/26/2019 0430   HDL 14 (L) 10/26/2019 0430   CHOLHDL 7.7 10/26/2019 0430   VLDL 39 10/26/2019 0430   LDLCALC 55 10/26/2019 0430   HgbA1c:  Lab Results  Component Value Date   HGBA1C 5.7 (H) 10/26/2019   Urine Drug Screen: No results found for: LABOPIA, COCAINSCRNUR, LABBENZ, AMPHETMU, THCU, LABBARB  Alcohol Level No results found for: ETH  IMAGING past 24 hours CT ANGIO HEAD W OR WO CONTRAST  Result Date: 10/26/2019 CLINICAL DATA:  Follow-up examination for acute stroke. EXAM: CT ANGIOGRAPHY HEAD AND NECK TECHNIQUE: Multidetector CT imaging of the head and neck was performed using the standard protocol during bolus administration of intravenous contrast. Multiplanar CT image reconstructions and MIPs were obtained to evaluate the vascular anatomy. Carotid stenosis measurements (when applicable) are obtained utilizing NASCET criteria, using the distal internal carotid diameter as the  denominator. CONTRAST:  59m OMNIPAQUE IOHEXOL 350 MG/ML SOLN COMPARISON:  Prior MRI from earlier the same day. FINDINGS: CTA NECK FINDINGS Aortic arch: Visualized aortic arch of normal caliber with normal 3 vessel morphology. Mild atheromatous change within the arch itself. No hemodynamically significant stenosis seen about the origin of the great vessels. Visualized subclavian arteries widely patent. Right carotid system: Right common carotid artery widely patent from its origin to the bifurcation without stenosis. No significant atheromatous narrowing about the right bifurcation. Multifocal irregularity about the mid-distal right ICA suggestive of FMD. Right ICA patent to the skull base without flow-limiting stenosis, dissection, or occlusion. Left carotid system: Left common carotid artery widely patent from its origin to the bifurcation without stenosis. No significant atheromatous narrowing about the left bifurcation. Multifocal irregularity about the mid-distal left ICA suggestive of FMD. Left ICA patent to the skull base without stenosis, dissection or occlusion. Vertebral arteries: Both vertebral arteries arise from the subclavian arteries. Left vertebral artery dominant. Suspected mild changes of FMD involving the vertebral arteries bilaterally. Vertebral arteries patent to the skull base without stenosis, dissection, or occlusion. Skeleton: No acute osseous abnormality. No discrete lytic or blastic osseous lesions. Other neck: No other acute  soft tissue abnormality within the neck. No mass lesion or adenopathy. Upper chest: Right-sided Port-A-Cath in place. 6 mm nodule present at the posterior right upper lobe (series 3, image 51). Additional 5 mm right upper lobe nodule noted (series 3, image 10) additional 4 mm right upper lobe nodule (series 3, image 13). Visualized lungs are otherwise clear. Review of the MIP images confirms the above findings CTA HEAD FINDINGS Anterior circulation: Petrous segments  widely patent. Mild atheromatous irregularity throughout the carotid siphons without flow-limiting stenosis. ICA termini well perfused. A1 segments patent bilaterally. Normal anterior communicating artery. Anterior cerebral arteries widely patent to their distal aspects. No M1 stenosis or occlusion. Normal MCA bifurcations. Distal left MCA branches well perfused. On the right, there is occlusion of a proximal right M2 branch, inferior division (series 10, image 472). Opacification of right MCA branches distally could be related to subocclusive and/or recanalized thrombus versus collateralization. Posterior circulation: Vertebral arteries patent to the vertebrobasilar junction without stenosis. Both picas patent. Basilar patent to its distal aspect without flow-limiting stenosis. Superior cerebral arteries patent bilaterally. Both PCAs primarily supplied via the basilar well perfused to their distal aspects. Prominent left posterior communicating artery noted. Venous sinuses: Grossly patent allowing for timing the contrast bolus. Anatomic variants: None significant.  No intracranial aneurysm. Review of the MIP images confirms the above findings IMPRESSION: 1. Positive CTA with proximal right M2 occlusion, inferior division. Opacification of distal right MCA branches could be related to subocclusive and/or recanalized thrombus versus collateral flow. Finding consistent with previously identified right MCA territory infarct. 2. Mild atherosclerotic change involving the carotid siphons without stenosis. No other hemodynamically significant or correctable stenosis about the major arterial vasculature of the head and neck. 3. Multifocal irregularity about the mid-distal ICAs bilaterally, suggesting FMD. Suspected mild involvement of both vertebral arteries as well. 4. Several right upper lobe pulmonary nodules measuring up to 6 mm as above, indeterminate. Non-contrast chest CT at 3-6 months is recommended. If the nodules  are stable at time of repeat CT, then future CT at 18-24 months (from today's scan) is considered optional for low-risk patients, but is recommended for high-risk patients. This recommendation follows the consensus statement: Guidelines for Management of Incidental Pulmonary Nodules Detected on CT Images: From the Fleischner Society 2017; Radiology 2017; 284:228-243. Electronically Signed   By: Jeannine Boga M.D.   On: 10/26/2019 00:00   CT Head Wo Contrast  Result Date: 10/25/2019 CLINICAL DATA:  Speech difficulty, aphasia history of liver cancer EXAM: CT HEAD WITHOUT CONTRAST TECHNIQUE: Contiguous axial images were obtained from the base of the skull through the vertex without intravenous contrast. COMPARISON:  None. FINDINGS: Brain: Hypodensity/edema within the right temporal lobe and insula. Negative for hemorrhage. No midline shift. The ventricles are nonenlarged. Vascular: No hyperdense vessels. Scattered calcifications at the carotid siphons. Vertebral artery calcification. Skull: Normal. Negative for fracture or focal lesion. Sinuses/Orbits: No acute finding. Other: None IMPRESSION: 1. Hypodensity/edema within the right temporal lobe and posterior insula, favor acute to subacute infarct over mass/metastatic disease, though MRI would be more definitive. 2. Negative for hemorrhage, midline shift or significant mass effect. Electronically Signed   By: Donavan Foil M.D.   On: 10/25/2019 16:18   CT ANGIO NECK W OR WO CONTRAST  Result Date: 10/26/2019 CLINICAL DATA:  Follow-up examination for acute stroke. EXAM: CT ANGIOGRAPHY HEAD AND NECK TECHNIQUE: Multidetector CT imaging of the head and neck was performed using the standard protocol during bolus administration of intravenous contrast. Multiplanar CT  image reconstructions and MIPs were obtained to evaluate the vascular anatomy. Carotid stenosis measurements (when applicable) are obtained utilizing NASCET criteria, using the distal internal  carotid diameter as the denominator. CONTRAST:  10m OMNIPAQUE IOHEXOL 350 MG/ML SOLN COMPARISON:  Prior MRI from earlier the same day. FINDINGS: CTA NECK FINDINGS Aortic arch: Visualized aortic arch of normal caliber with normal 3 vessel morphology. Mild atheromatous change within the arch itself. No hemodynamically significant stenosis seen about the origin of the great vessels. Visualized subclavian arteries widely patent. Right carotid system: Right common carotid artery widely patent from its origin to the bifurcation without stenosis. No significant atheromatous narrowing about the right bifurcation. Multifocal irregularity about the mid-distal right ICA suggestive of FMD. Right ICA patent to the skull base without flow-limiting stenosis, dissection, or occlusion. Left carotid system: Left common carotid artery widely patent from its origin to the bifurcation without stenosis. No significant atheromatous narrowing about the left bifurcation. Multifocal irregularity about the mid-distal left ICA suggestive of FMD. Left ICA patent to the skull base without stenosis, dissection or occlusion. Vertebral arteries: Both vertebral arteries arise from the subclavian arteries. Left vertebral artery dominant. Suspected mild changes of FMD involving the vertebral arteries bilaterally. Vertebral arteries patent to the skull base without stenosis, dissection, or occlusion. Skeleton: No acute osseous abnormality. No discrete lytic or blastic osseous lesions. Other neck: No other acute soft tissue abnormality within the neck. No mass lesion or adenopathy. Upper chest: Right-sided Port-A-Cath in place. 6 mm nodule present at the posterior right upper lobe (series 3, image 51). Additional 5 mm right upper lobe nodule noted (series 3, image 10) additional 4 mm right upper lobe nodule (series 3, image 13). Visualized lungs are otherwise clear. Review of the MIP images confirms the above findings CTA HEAD FINDINGS Anterior  circulation: Petrous segments widely patent. Mild atheromatous irregularity throughout the carotid siphons without flow-limiting stenosis. ICA termini well perfused. A1 segments patent bilaterally. Normal anterior communicating artery. Anterior cerebral arteries widely patent to their distal aspects. No M1 stenosis or occlusion. Normal MCA bifurcations. Distal left MCA branches well perfused. On the right, there is occlusion of a proximal right M2 branch, inferior division (series 10, image 472). Opacification of right MCA branches distally could be related to subocclusive and/or recanalized thrombus versus collateralization. Posterior circulation: Vertebral arteries patent to the vertebrobasilar junction without stenosis. Both picas patent. Basilar patent to its distal aspect without flow-limiting stenosis. Superior cerebral arteries patent bilaterally. Both PCAs primarily supplied via the basilar well perfused to their distal aspects. Prominent left posterior communicating artery noted. Venous sinuses: Grossly patent allowing for timing the contrast bolus. Anatomic variants: None significant.  No intracranial aneurysm. Review of the MIP images confirms the above findings IMPRESSION: 1. Positive CTA with proximal right M2 occlusion, inferior division. Opacification of distal right MCA branches could be related to subocclusive and/or recanalized thrombus versus collateral flow. Finding consistent with previously identified right MCA territory infarct. 2. Mild atherosclerotic change involving the carotid siphons without stenosis. No other hemodynamically significant or correctable stenosis about the major arterial vasculature of the head and neck. 3. Multifocal irregularity about the mid-distal ICAs bilaterally, suggesting FMD. Suspected mild involvement of both vertebral arteries as well. 4. Several right upper lobe pulmonary nodules measuring up to 6 mm as above, indeterminate. Non-contrast chest CT at 3-6 months  is recommended. If the nodules are stable at time of repeat CT, then future CT at 18-24 months (from today's scan) is considered optional for low-risk patients,  but is recommended for high-risk patients. This recommendation follows the consensus statement: Guidelines for Management of Incidental Pulmonary Nodules Detected on CT Images: From the Fleischner Society 2017; Radiology 2017; 284:228-243. Electronically Signed   By: Jeannine Boga M.D.   On: 10/26/2019 00:00   MR BRAIN WO CONTRAST  Result Date: 10/25/2019 CLINICAL DATA:  Slurred speech, abnormal CT EXAM: MRI HEAD WITHOUT CONTRAST TECHNIQUE: Multiplanar, multiecho pulse sequences of the brain and surrounding structures were obtained without intravenous contrast. COMPARISON:  Correlation made with same day CT FINDINGS: Brain: There is cortical/subcortical reduced diffusion in the right MCA territory primarily involving the temporal lobe and posterior insula with some additional parietal greater than frontal involvement. Contralateral foci of reduced diffusion are also present in the left frontal lobe. A few small foci are present within the right cerebellar hemisphere. No evidence of hemorrhage. There is no intracranial mass or significant mass effect. There is no hydrocephalus or extra-axial fluid collection. Ventricles and sulci are normal in size and configuration. Vascular: Major vessel flow voids at the skull base are preserved. Skull and upper cervical spine: Normal marrow signal is preserved. Sinuses/Orbits: Trace mucosal thickening.  Orbits are unremarkable. Other: Sella is unremarkable.  Mastoid air cells are clear. IMPRESSION: Acute and more subacute appearing infarctions involving the right much greater than left MCA territories. There is also minor involvement of the right cerebellum. No evidence of hemorrhage. Electronically Signed   By: Macy Mis M.D.   On: 10/25/2019 19:46   DG Chest Port 1 View  Result Date:  10/25/2019 CLINICAL DATA:  Tachycardia EXAM: PORTABLE CHEST 1 VIEW COMPARISON:  07/27/2019 FINDINGS: Right chest wall port catheter tip overlies the SVC. Persistent elevation of the right hemidiaphragm. No new consolidation or edema. No pleural effusion or pneumothorax. Cardiomediastinal contours are stable. IMPRESSION: No acute process in the chest. Electronically Signed   By: Macy Mis M.D.   On: 10/25/2019 15:25   VAS [place] LOWER EXTREMITY VENOUS (DVT)  Result Date: 10/26/2019  Lower Venous DVTStudy Indications: Stroke.  Risk Factors: Cancer Mets. Limitations: Poor ultrasound/tissue interface. Comparison Study: No prior exam. Performing Technologist: Baldwin Crown ARDMS, RVT  Examination Guidelines: A complete evaluation includes B-mode imaging, spectral Doppler, color Doppler, and power Doppler as needed of all accessible portions of each vessel. Bilateral testing is considered an integral part of a complete examination. Limited examinations for reoccurring indications may be performed as noted. The reflux portion of the exam is performed with the patient in reverse Trendelenburg.  +---------+---------------+---------+-----------+----------+------------------+ RIGHT    CompressibilityPhasicitySpontaneityPropertiesThrombus Aging     +---------+---------------+---------+-----------+----------+------------------+ CFV      Full           Yes      Yes                                     +---------+---------------+---------+-----------+----------+------------------+ SFJ      Full                                                            +---------+---------------+---------+-----------+----------+------------------+ FV Prox  Full                                                            +---------+---------------+---------+-----------+----------+------------------+  FV Mid   Full                                                             +---------+---------------+---------+-----------+----------+------------------+ FV DistalFull                                         Visualized with                                                          color              +---------+---------------+---------+-----------+----------+------------------+ PFV      Full                                                            +---------+---------------+---------+-----------+----------+------------------+ POP      Full           Yes      Yes                                     +---------+---------------+---------+-----------+----------+------------------+ PTV      Full                                         Visualized with                                                          color              +---------+---------------+---------+-----------+----------+------------------+ PERO     Full                                         Visualized with                                                          color              +---------+---------------+---------+-----------+----------+------------------+   +---------+---------------+---------+-----------+----------+------------------+ LEFT     CompressibilityPhasicitySpontaneityPropertiesThrombus Aging     +---------+---------------+---------+-----------+----------+------------------+ CFV      Full           Yes      Yes                                     +---------+---------------+---------+-----------+----------+------------------+  SFJ      Full                                                            +---------+---------------+---------+-----------+----------+------------------+ FV Prox  Full                                                            +---------+---------------+---------+-----------+----------+------------------+ FV Mid   Full                                                             +---------+---------------+---------+-----------+----------+------------------+ FV DistalFull                                         Visualized with                                                          color              +---------+---------------+---------+-----------+----------+------------------+ PFV      Full                                                            +---------+---------------+---------+-----------+----------+------------------+ POP      Full           Yes      Yes                                     +---------+---------------+---------+-----------+----------+------------------+ PTV      Full                                         Visualized with                                                          color              +---------+---------------+---------+-----------+----------+------------------+ PERO     Full  Visualized with                                                          color              +---------+---------------+---------+-----------+----------+------------------+    Summary: BILATERAL: - No evidence of deep vein thrombosis seen in the lower extremities, bilaterally.  RIGHT: - No cystic structure found in the popliteal fossa.  LEFT: - No cystic structure found in the popliteal fossa.  *See table(s) above for measurements and observations.    Preliminary     PHYSICAL EXAM  Temp:  [98 F (36.7 C)-99.1 F (37.3 C)] 99.1 F (37.3 C) (03/09 1235) Pulse Rate:  [90-118] 90 (03/09 1235) Resp:  [16-24] 20 (03/09 1235) BP: (103-165)/(68-146) 103/68 (03/09 1235) SpO2:  [100 %] 100 % (03/09 1235) FiO2 (%):  [21 %] 21 % (03/08 2201)  General - Well nourished, well developed, in no apparent distress.  Ophthalmologic - fundi not visualized due to noncooperation.  Cardiovascular - Regular rhythm and rate.  Mental Status -  Level of arousal and orientation to time, place,  and person were intact. Follow simple commands.  Severe dysarthria, combined with mild expressive aphasia.  Able to name, able to repeat simple sentences but not complex sentences.  Cranial Nerves II - XII - II - Visual field intact OU. III, IV, VI - Extraocular movements intact. V - Facial sensation intact bilaterally. VII - mild right nasolabial fold flatterning. VIII - Hearing & vestibular intact bilaterally. X - Palate elevates symmetrically. XI - Chin turning & shoulder shrug intact bilaterally. XII - Tongue protrusion intact.  Motor Strength - The patient's strength was normal in all extremities and pronator drift was absent.  Bulk was normal and fasciculations were absent.   Motor Tone - Muscle tone was assessed at the neck and appendages and was normal.  Reflexes - The patient's reflexes were symmetrical in all extremities and she had no pathological reflexes.  Sensory - Light touch, temperature/pinprick were assessed and were symmetrical.    Coordination - The patient had normal movements in the hands and feet with no ataxia or dysmetria.  Tremor was absent.  Gait and Station - deferred.   ASSESSMENT/PLAN Melissa Hurley is a 54 y.o. female with recently diagnosed cholangiocarcinoma with metastases, currently undergoing chemotherapy,  presenting with 3 days of slurred speech w/ expressive aphasia.   Stroke: Multiple territory, bilateral infarcts, embolic pattern, likely due to hypercoagulability in setting of known cholangiocarcinoma w/ mets  CT head hypodensity/edema R temporal lobe and posterior insula.   MRI  Subacute>acute, R>L MCA territory infarcts. punctate R cerebellar infarcts.   CTA head & neck proximal R M2 inferior division occlusion. Mild ICA siphon atherosclerosis. Mid-distal B ICAs and B VA w/ multifocal irregularity suggesting FMD.   2D Echo EF 60-65%  LE doppler no DVT  LDL 55  HgbA1c 5.7  Lovenox 40 mg sq daily for VTE prophylaxis  No  antithrombotic prior to admission, now on aspirin 325 mg daily. Consider eliquis in 3-5 days given moderate sized infarct at right MCA territory.   Therapy recommendations:  pending   Disposition:  pending   Cholangiocarcinoma with metastases  Diagnosed in 07/2019  Met to spine and pericardial  Severe RUL pulmonary nodules, indeterminate, CT chest recommended.  currently undergoing chemotherapy and radiation  followed at [facility] oncology  Possible hypercoagulable state although neg DVT  Recommend eliquis in 3-5 days   Blood Pressure . Permissive hypertension (OK if < 220/120) but gradually normalize in 5-7 days . No hx HTN, on no home med (on metoprolol for tachycardia)  Other Stroke Risk Factors  Obesity, Body mass index is 32.89 kg/m., recommend weight loss, diet and exercise as appropriate   Other Active Problems  Mild lactic acidosis  Sinus tachycardia started on BB but pt stopped, resumed during hospitalization   Anemia felt r/t chemo  Thrombocytosis   QT prolongation   Hospital day # 1  Melissa Hawking, MD PhD Stroke Neurology 10/26/2019 4:09 PM   To contact Stroke Continuity provider, please refer to [email or web address] After hours, contact General Neurology

## 2019-10-26 NOTE — Plan of Care (Signed)
  Problem: Education: Goal: Knowledge of disease or condition will improve Outcome: Progressing Goal: Knowledge of secondary prevention will improve Outcome: Progressing Goal: Knowledge of patient specific risk factors addressed and post discharge goals established will improve Outcome: Progressing Goal: Individualized Educational Video(s) Outcome: Progressing   Problem: Coping: Goal: Will verbalize positive feelings about self Outcome: Progressing Goal: Will identify appropriate support needs Outcome: Progressing   Problem: Health Behavior/Discharge Planning: Goal: Ability to manage health-related needs will improve Outcome: Progressing   Problem: Self-Care: Goal: Ability to participate in self-care as condition permits will improve Outcome: Progressing Goal: Verbalization of feelings and concerns over difficulty with self-care will improve Outcome: Progressing Goal: Ability to communicate needs accurately will improve Outcome: Progressing   Problem: Nutrition: Goal: Risk of aspiration will decrease Outcome: Progressing   Problem: Education: Goal: Knowledge of General Education information will improve Description: Including pain rating scale, medication(s)/side effects and non-pharmacologic comfort measures Outcome: Progressing   Problem: Health Behavior/Discharge Planning: Goal: Ability to manage health-related needs will improve Outcome: Progressing   Problem: Clinical Measurements: Goal: Ability to maintain clinical measurements within normal limits will improve Outcome: Progressing Goal: Will remain free from infection Outcome: Progressing Goal: Diagnostic test results will improve Outcome: Progressing Goal: Respiratory complications will improve Outcome: Progressing Goal: Cardiovascular complication will be avoided Outcome: Progressing   Problem: Activity: Goal: Risk for activity intolerance will decrease Outcome: Progressing   Problem:  Nutrition: Goal: Adequate nutrition will be maintained Outcome: Progressing   Problem: Coping: Goal: Level of anxiety will decrease Outcome: Progressing   Problem: Elimination: Goal: Will not experience complications related to bowel motility Outcome: Progressing Goal: Will not experience complications related to urinary retention Outcome: Progressing   Problem: Pain Managment: Goal: General experience of comfort will improve Outcome: Progressing   Problem: Safety: Goal: Ability to remain free from injury will improve Outcome: Progressing   Problem: Skin Integrity: Goal: Risk for impaired skin integrity will decrease Outcome: Progressing

## 2019-10-26 NOTE — Evaluation (Addendum)
Speech Language Pathology Evaluation Patient Details Name: Melissa Hurley MRN: [id number] DOB: 22-Jun-1966 Today's Date: 10/26/2019 Time: [id number] SLP Time Calculation (min) (ACUTE ONLY): 15 min  Problem List:  Patient Active Problem List   Diagnosis Date Noted  . Acute CVA (cerebrovascular accident) ([place]) 10/25/2019  . Lactic acidosis 10/25/2019  . Metabolic acidosis [id number]  . Anemia 10/25/2019  . Adenocarcinoma of unknown primary ([place]) 08/27/2019  . Fibroid uterus 08/27/2019   Past Medical History:  Past Medical History:  Diagnosis Date  . Cancer ([place])    Liver  . Cholangiocarcinoma ([place])   . Metastasis ([place])   . Pericardial effusion   . Sinus tachycardia   . Uterine fibroid    Past Surgical History:  Past Surgical History:  Procedure Laterality Date  . CESAREAN SECTION     HPI:  Melissa Hurley is a 54 y.o. female recently diagnosed with cholangiocarcinoma with metastases, currently undergoing chemotherapy, presented to ED with slurred and incomprehensible speech.  Brain MRI 3/8: Acute and more subacute appearing infarctions involving the right much greater than left MCA territories. There is also minor involvement of the right cerebellum.   Assessment / Plan / Recommendation Clinical Impression   Patient encountered at bedside for speech/language evaluation. Husband present. Patient presents with moderate dysarthria and suspected mild apraxia of speech in the setting of R>L MCA acute and subacute infarcts. Patient is fatigued, but agreeable to participation. Pt with mild right facial droop, lingual deviation to the right upon protrusion. She is oriented x4. Receptive language skills appear to be grossly WFL. She is able to follow commands and engage appropriately in simple conversation. She is able to name all items in confrontation naming task and denies word-finding difficulty, further diagnostic treatment needed to assess expressive language skills in-depth.  Patient's intelligibility is reduced, approximately 75% at the word level, 65% at the sentence level. Patient is aware of deficits, demonstrating some frustration. Patient's speech is characterized by consonant distortion, oral groping, inconsistency of errors. Cognitive-linguistic skills appear to be grossly [facility], though further diagnostic treatment is warranted to further assess in depth. Evaluation ended early as patient appearing to become increasingly fatigued and reporting she felt very tired. ST to follow acutely to address aforementioned deficits and to conduct further diagnostic treatment. Pt will benefit from skilled ST at the next venue of care as well.     SLP Assessment  SLP Visit Diagnosis: Dysarthria and anarthria (R47.1);Apraxia (R48.2)    Follow Up Recommendations  Inpatient Rehab;Home health SLP    Frequency and Duration min 2x/week  2 weeks      SLP Evaluation Cognition  Overall Cognitive Status: Within Functional Limits for tasks assessed Orientation Level: Oriented X4       Comprehension  Auditory Comprehension Overall Auditory Comprehension: Appears within functional limits for tasks assessed Visual Recognition/Discrimination Discrimination: Not tested Reading Comprehension Reading Status: Not tested    Expression Expression Primary Mode of Expression: Verbal Verbal Expression Overall Verbal Expression: Appears within functional limits for tasks assessed Initiation: No impairment Automatic Speech: Singing;Month of year;Day of week;Counting Level of Generative/Spontaneous Verbalization: Phrase Naming: No impairment Pragmatics: No impairment Written Expression Dominant Hand: Right Written Expression: Not tested   Oral / Motor  Oral Motor/Sensory Function Overall Oral Motor/Sensory Function: Mild impairment Facial ROM: Within Functional Limits Facial Symmetry: Abnormal symmetry right Facial Strength: Within Functional Limits Lingual Symmetry: Abnormal  symmetry right Motor Speech Overall Motor Speech: Impaired Respiration: Within functional limits Phonation: Normal Resonance: Within functional limits Articulation: Impaired  Level of Impairment: Phrase Intelligibility: Intelligibility reduced Word: 75-100% accurate Phrase: 50-74% accurate Sentence: 25-49% accurate Conversation: 25-49% accurate Motor Planning: Impaired Level of Impairment: Phrase Motor Speech Errors: Aware;Groping for words;Inconsistent   GO                    Sashay Felling 10/26/2019, 1:01 PM  [name], M.Ed., [facility] [phone]: Acute Rehab office [phone] - pager

## 2019-10-26 NOTE — Plan of Care (Signed)
  Problem: Coping: Goal: Will verbalize positive feelings about self Outcome: Progressing   Problem: Health Behavior/Discharge Planning: Goal: Ability to manage health-related needs will improve Outcome: Progressing   Problem: Education: Goal: Knowledge of secondary prevention will improve Outcome: Progressing

## 2019-10-26 NOTE — Evaluation (Signed)
Physical Therapy Evaluation Patient Details Name: Melissa Hurley MRN: [id number] DOB: 1965/12/05 Today's Date: 10/26/2019   History of Present Illness  54 y.o. female recently diagnosed with cholangiocarcinoma with metastases, currently undergoing chemotherapy, presented to ED with slurred and incomprehensible speech.  Brain MRI 3/8: Acute and more subacute appearing infarctions involving the right much greater than left MCA territories. There is also minor involvement of the right cerebellum  Clinical Impression  Patient presents with mobility limited due to fatigue, decreased balance and overall limited activity tolerance.  She needs min A for mobility and previously was mobilizing independently.  She reports fell off EOB at home.  Feel she will benefit from skilled PT in the acute setting to allow return home with family support and follow up HHPT.     Follow Up Recommendations Home health PT    Equipment Recommendations  Other (comment)(TBA)    Recommendations for Other Services       Precautions / Restrictions Precautions Precautions: Fall Precaution Comments: reports fall within last 2 weeks off the bed Restrictions Weight Bearing Restrictions: No      Mobility  Bed Mobility Overal bed mobility: Modified Independent             General bed mobility comments: increased time exiting on R side  Transfers Overall transfer level: Needs assistance Equipment used: Rolling walker (2 wheeled) Transfers: Sit to/from Stand Sit to Stand: Min assist         General transfer comment: pt pulling up on Rw.  Ambulation/Gait Ambulation/Gait assistance: Min assist Gait Distance (Feet): 45 Feet Assistive device: Rolling walker (2 wheeled);None Gait Pattern/deviations: Step-through pattern;Decreased stride length;Wide base of support     General Gait Details: in room only due to nausea, fatigues quickly, but used RW first two laps. then left walker and no device with min to  minguard A for safety  Stairs            Wheelchair Mobility    Modified Rankin (Stroke Patients Only) Modified Rankin (Stroke Patients Only) Pre-Morbid Rankin Score: No significant disability Modified Rankin: Moderately severe disability     Balance Overall balance assessment: Needs assistance Sitting-balance support: No upper extremity supported;Feet supported Sitting balance-Leahy Scale: Good     Standing balance support: Bilateral upper extremity supported;During functional activity Standing balance-Leahy Scale: Fair                               Pertinent Vitals/Pain Pain Assessment: Faces Faces Pain Scale: Hurts a little bit Pain Location: generalized Pain Descriptors / Indicators: Discomfort Pain Intervention(s): Monitored during session    Home Living Family/patient expects to be discharged to:: Private residence Living Arrangements: Spouse/significant other;Children(12 y/o) Available Help at Discharge: Family Type of Home: House Home Access: Stairs to enter Entrance Stairs-Rails: Can reach both Entrance Stairs-Number of Steps: 3 Home Layout: Two level;Bed/bath upstairs;Able to live on main level with bedroom/bathroom Home Equipment: None 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[employer] -     Prior Function Level of Independence: Independent               Hand Dominance   Dominant Hand: Right    Extremity/Trunk Assessment   Upper Extremity Assessment Upper Extremity Assessment: Defer to OT evaluation    Lower Extremity Assessment Lower Extremity Assessment: Overall WFL for tasks assessed       Communication   Communication: Expressive difficulties  Cognition Arousal/Alertness: Awake/alert Behavior  During Therapy: WFL for tasks assessed/performed Overall Cognitive Status: Within Functional Limits for tasks assessed                                        General Comments  General comments (skin integrity, edema, etc.): nauseated, reports hasn't kept food down for a week with chemo; easily fatigued    Exercises     Assessment/Plan    PT Assessment Patient needs continued PT services  PT Problem List Decreased activity tolerance;Decreased mobility;Decreased knowledge of use of DME;Decreased balance       PT Treatment Interventions DME instruction;Stair training;Therapeutic activities;Balance training;Gait training;Functional mobility training;Therapeutic exercise;Patient/family education    PT Goals (Current goals can be found in the Care Plan section)  Acute Rehab PT Goals 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 PT Goal Formulation: With patient/family Time For Goal Achievement: 11/09/19 Potential to Achieve Goals: Good    Frequency Min 4X/week   Barriers to discharge        Co-evaluation PT/OT/SLP Co-Evaluation/Treatment: Yes Reason for Co-Treatment: For patient/therapist safety PT goals addressed during session: Mobility/safety with mobility;Balance         AM-PAC PT "6 Clicks" Mobility  Outcome Measure Help needed turning from your back to your side while in a flat bed without using bedrails?: None Help needed moving from lying on your back to sitting on the side of a flat bed without using bedrails?: None Help needed moving to and from a bed to a chair (including a wheelchair)?: A Little Help needed standing up from a chair using your arms (e.g., wheelchair or bedside chair)?: A Little Help needed to walk in hospital room?: A Little Help needed climbing 3-5 steps with a railing? : A Little 6 Click Score: 20    End of Session Equipment Utilized During Treatment: Gait belt Activity Tolerance: Patient limited by fatigue Patient left: in bed;with call bell/phone within reach;with family/visitor present   PT Visit Diagnosis: Other abnormalities of gait and mobility (R26.89);Other symptoms and signs involving the nervous  system (R29.898)    Time: [id number] PT Time Calculation (min) (ACUTE ONLY): 32 min   Charges:   PT Evaluation $PT Eval Moderate Complexity: Melissa Hurley, [place] [facility] [phone] 10/26/2019   Melissa Hurley 10/26/2019, 4:24 PM

## 2019-10-26 NOTE — Progress Notes (Signed)
  Echocardiogram 2D Echocardiogram has been performed.  Melissa Hurley 10/26/2019, 10:37 AM

## 2019-10-26 NOTE — Progress Notes (Signed)
SLP Cancellation Note  Patient Details Name: Melissa Hurley MRN: [id number] DOB: [birth year]   Cancelled treatment:       Reason Eval/Treat Not Completed: Patient at procedure or test/unavailable  ST order seen and acknowledged. Pt not available. ST will continue efforts.  [name], M.Ed., CCC-SLP Speech Therapy Acute Rehabilitation [phone]: Acute Rehab office [phone] - pager   [name] 10/26/2019, 10:44 AM

## 2019-10-26 NOTE — Progress Notes (Signed)
PROGRESS NOTE    Melissa Hurley  [id number] DOB: 10-12-1965 DOA: 10/25/2019 PCP: Patient, No Pcp Per   Brief Narrative:  54 year old with metastatic cholangiocarcinoma managed by oncology at [facility] on chemotherapy presenting with slurred speech for 3 days.  CTh acute CVA versus mass.  MRI brain showed acute left MCA CVA.   Assessment & Plan:   Principal Problem:   Acute CVA (cerebrovascular accident) [facility]) Active Problems:   Adenocarcinoma of unknown primary ([place])   Lactic acidosis   Metabolic acidosis   Anemia  Slurred speech secondary to acute left MCA CVA -Stroke protocol, continues to have dysarthria -MRI brain without contrast-confirmed CVA.  CT head-CVA -CTA head and neck-proximal M2 occlusion, distal MCA branches subocclusive multifocal irregularities of mid distal ICA, several pulmonary nodules -Carotid Dopplers -Maintain Euthermia.  -ASA  -Echocardiogram -LDL-55, A1c-5.7 -Frequent neuro checks -Atorvastatin PO within 24 hrs.  -Risk factor modification -Consult Neurology -PT/OT eval -Neurology-inpatient rehab  Mild lactic acidosis -Resolved   Metastatic cholangiocarcinoma -On chemotherapy.  Follows at [facility]  Anemia of chronic disease Thrombocytosis -Secondary to malignancy and chemotherapy  Prolonged QTC-avoid QTC prolonging drugs  DVT prophylaxis: Lovenox Code Status: Full code Family Communication:   Disposition Plan:   Patient From= home  Patient Anticipated D/C place= to be determined  Barriers= maintain hospital stay for evaluation of dysarthria/acute CVA.  Neurology team following, will discharge once cleared by their service.  Discharge destination still be determined depending on PT evaluation.   Subjective: Patient ate her breakfast okay this morning but later when attempting to swallow her pills she had difficulty with coughing spell.  She clearly has persistent dysarthria.  Review of Systems Otherwise negative  except as per HPI, including: General: Denies fever, chills, night sweats or unintended weight loss. Resp: Denies cough, wheezing, shortness of breath. Cardiac: Denies chest pain, palpitations, orthopnea, paroxysmal nocturnal dyspnea. GI: Denies abdominal pain, nausea, vomiting, diarrhea or constipation GU: Denies dysuria, frequency, hesitancy or incontinence MS: Denies muscle aches, joint pain or swelling Neuro: Dysarthria Psych: Denies anxiety, depression, SI/HI/AVH Skin: Denies new rashes or lesions ID: Denies sick contacts, exotic exposures, travel  Examination:  General exam: Appears calm and comfortable  Respiratory system: Clear to auscultation. Respiratory effort normal. Cardiovascular system: S1 & S2 heard, RRR. No JVD, murmurs, rubs, gallops or clicks. No pedal edema. Gastrointestinal system: Abdomen is nondistended, soft and nontender. No organomegaly or masses felt. Normal bowel sounds heard. Central nervous system: Alert and oriented.  Dysarthric speech.  Rest of the cranial nerves intact. Extremities: Symmetric 5 x 5 power. Skin: No rashes, lesions or ulcers Psychiatry: Judgement and insight appear normal. Mood & affect appropriate.     Objective: Vitals:   10/26/19 0112 10/26/19 0322 10/26/19 0400 10/26/19 0756  BP: (!) 157/123 109/75 120/71 (!) 139/95  Pulse:    91  Resp: (!) 22 16 (!) 21 20  Temp: 99 F (37.2 C) 98 F (36.7 C) 98.8 F (37.1 C)   TempSrc: Oral Oral Oral Tympanic  SpO2:    100%  Weight:      Height:        Intake/Output Summary (Last 24 hours) at 10/26/2019 0855 Last data filed at 10/26/2019 0600 Gross per 24 hour  Intake 903.84 ml  Output --  Net 903.84 ml   Filed Weights   10/25/19 1430  Weight: 95.3 kg     Data Reviewed:   CBC: Recent Labs  Lab 10/25/19 1535 10/26/19 0430  WBC 3.9* 4.2  NEUTROABS 1.1*  --   HGB 11.6* 10.0*  HCT 38.2 33.4*  MCV 80.8 80.3  PLT 518* [id number]*   Basic Metabolic Panel: Recent Labs  Lab  10/25/19 1535 10/26/19 0430  NA 141  --   K 4.2  --   CL 114*  --   CO2 18*  --   GLUCOSE 133*  --   BUN 10  --   CREATININE 1.00  --   CALCIUM 8.1*  --   MG  --  1.7   GFR: Estimated Creatinine Clearance: 78 mL/min (by C-G formula based on SCr of 1 mg/dL). Liver Function Tests: Recent Labs  Lab 10/25/19 1535  AST 25  ALT 26  ALKPHOS 105  BILITOT 0.7  PROT 6.3*  ALBUMIN 2.6*   Recent Labs  Lab 10/25/19 1535  LIPASE 16   Recent Labs  Lab 10/25/19 1535  AMMONIA 13   Coagulation Profile: Recent Labs  Lab 10/25/19 1535  INR 1.4*   Cardiac Enzymes: No results for input(s): CKTOTAL, CKMB, CKMBINDEX, TROPONINI in the last 168 hours. BNP (last 3 results) No results for input(s): PROBNP in the last 8760 hours. HbA1C: Recent Labs    10/26/19 0430  HGBA1C 5.7*   CBG: No results for input(s): GLUCAP in the last 168 hours. Lipid Profile: Recent Labs    10/26/19 0430  CHOL 108  HDL 14*  LDLCALC 55  TRIG 194*  CHOLHDL 7.7   Thyroid Function Tests: No results for input(s): TSH, T4TOTAL, FREET4, T3FREE, THYROIDAB in the last 72 hours. Anemia Panel: No results for input(s): VITAMINB12, FOLATE, FERRITIN, TIBC, IRON, RETICCTPCT in the last 72 hours. Sepsis Labs: Recent Labs  Lab 10/25/19 1535 10/26/19 0430  LATICACIDVEN 2.2* 1.8    Recent Results (from the past 240 hour(s))  SARS CORONAVIRUS 2 (TAT 6-24 HRS) Nasopharyngeal Nasopharyngeal Swab     Status: None   Collection Time: 10/25/19  9:31 PM   Specimen: Nasopharyngeal Swab  Result Value Ref Range Status   SARS Coronavirus 2 NEGATIVE NEGATIVE Final    Comment: (NOTE) SARS-CoV-2 target nucleic acids are NOT DETECTED. The SARS-CoV-2 RNA is generally detectable in upper and lower respiratory specimens during the acute phase of infection. Negative results do not preclude SARS-CoV-2 infection, do not rule out co-infections with other pathogens, and should not be used as the sole basis for treatment or  other patient management decisions. Negative results must be combined with clinical observations, patient history, and epidemiological information. The expected result is Negative. Fact Sheet for Patients: SugarRoll.be Fact Sheet for Healthcare Providers: https://www.woods-mathews.com/ This test is not yet approved or cleared by the [place] FDA and  has been authorized for detection and/or diagnosis of SARS-CoV-2 by FDA under an Emergency Use Authorization (EUA). This EUA will remain  in effect (meaning this test can be used) for the duration of the COVID-19 declaration under Section 56 4(b)(1) of the Act, 21 U.S.C. section 360bbb-3(b)(1), unless the authorization is terminated or revoked sooner. Performed at [facility], [place] [address] [place], [place] [address]          Radiology Studies: CT ANGIO HEAD W OR WO CONTRAST  Result Date: 10/26/2019 CLINICAL DATA:  Follow-up examination for acute stroke. EXAM: CT ANGIOGRAPHY HEAD AND NECK TECHNIQUE: Multidetector CT imaging of the head and neck was performed using the standard protocol during bolus administration of intravenous contrast. Multiplanar CT image reconstructions and MIPs were obtained to evaluate the vascular anatomy. Carotid stenosis measurements (when applicable)  are obtained utilizing NASCET criteria, using the distal internal carotid diameter as the denominator. CONTRAST:  74mL OMNIPAQUE IOHEXOL 350 MG/ML SOLN COMPARISON:  Prior MRI from earlier the same day. FINDINGS: CTA NECK FINDINGS Aortic arch: Visualized aortic arch of normal caliber with normal 3 vessel morphology. Mild atheromatous change within the arch itself. No hemodynamically significant stenosis seen about the origin of the great vessels. Visualized subclavian arteries widely patent. Right carotid system: Right common carotid artery widely patent from its origin to the bifurcation without stenosis. No  significant atheromatous narrowing about the right bifurcation. Multifocal irregularity about the mid-distal right ICA suggestive of FMD. Right ICA patent to the skull base without flow-limiting stenosis, dissection, or occlusion. Left carotid system: Left common carotid artery widely patent from its origin to the bifurcation without stenosis. No significant atheromatous narrowing about the left bifurcation. Multifocal irregularity about the mid-distal left ICA suggestive of FMD. Left ICA patent to the skull base without stenosis, dissection or occlusion. Vertebral arteries: Both vertebral arteries arise from the subclavian arteries. Left vertebral artery dominant. Suspected mild changes of FMD involving the vertebral arteries bilaterally. Vertebral arteries patent to the skull base without stenosis, dissection, or occlusion. Skeleton: No acute osseous abnormality. No discrete lytic or blastic osseous lesions. Other neck: No other acute soft tissue abnormality within the neck. No mass lesion or adenopathy. Upper chest: Right-sided Port-A-Cath in place. 6 mm nodule present at the posterior right upper lobe (series 3, image 51). Additional 5 mm right upper lobe nodule noted (series 3, image 10) additional 4 mm right upper lobe nodule (series 3, image 13). Visualized lungs are otherwise clear. Review of the MIP images confirms the above findings CTA HEAD FINDINGS Anterior circulation: Petrous segments widely patent. Mild atheromatous irregularity throughout the carotid siphons without flow-limiting stenosis. ICA termini well perfused. A1 segments patent bilaterally. Normal anterior communicating artery. Anterior cerebral arteries widely patent to their distal aspects. No M1 stenosis or occlusion. Normal MCA bifurcations. Distal left MCA branches well perfused. On the right, there is occlusion of a proximal right M2 branch, inferior division (series 10, image 472). Opacification of right MCA branches distally could be  related to subocclusive and/or recanalized thrombus versus collateralization. Posterior circulation: Vertebral arteries patent to the vertebrobasilar junction without stenosis. Both picas patent. Basilar patent to its distal aspect without flow-limiting stenosis. Superior cerebral arteries patent bilaterally. Both PCAs primarily supplied via the basilar well perfused to their distal aspects. Prominent left posterior communicating artery noted. Venous sinuses: Grossly patent allowing for timing the contrast bolus. Anatomic variants: None significant.  No intracranial aneurysm. Review of the MIP images confirms the above findings IMPRESSION: 1. Positive CTA with proximal right M2 occlusion, inferior division. Opacification of distal right MCA branches could be related to subocclusive and/or recanalized thrombus versus collateral flow. Finding consistent with previously identified right MCA territory infarct. 2. Mild atherosclerotic change involving the carotid siphons without stenosis. No other hemodynamically significant or correctable stenosis about the major arterial vasculature of the head and neck. 3. Multifocal irregularity about the mid-distal ICAs bilaterally, suggesting FMD. Suspected mild involvement of both vertebral arteries as well. 4. Several right upper lobe pulmonary nodules measuring up to 6 mm as above, indeterminate. Non-contrast chest CT at 3-6 months is recommended. If the nodules are stable at time of repeat CT, then future CT at 18-24 months (from today's scan) is considered optional for low-risk patients, but is recommended for high-risk patients. This recommendation follows the consensus statement: Guidelines for Management of Incidental  Pulmonary Nodules Detected on CT Images: From the Fleischner Society 2017; Radiology 2017; [phone]. Electronically Signed   By: Jeannine Boga M.D.   On: 10/26/2019 00:00   CT Head Wo Contrast  Result Date: 10/25/2019 CLINICAL DATA:  Speech  difficulty, aphasia history of liver cancer EXAM: CT HEAD WITHOUT CONTRAST TECHNIQUE: Contiguous axial images were obtained from the base of the skull through the vertex without intravenous contrast. COMPARISON:  None. FINDINGS: Brain: Hypodensity/edema within the right temporal lobe and insula. Negative for hemorrhage. No midline shift. The ventricles are nonenlarged. Vascular: No hyperdense vessels. Scattered calcifications at the carotid siphons. Vertebral artery calcification. Skull: Normal. Negative for fracture or focal lesion. Sinuses/Orbits: No acute finding. Other: None IMPRESSION: 1. Hypodensity/edema within the right temporal lobe and posterior insula, favor acute to subacute infarct over mass/metastatic disease, though MRI would be more definitive. 2. Negative for hemorrhage, midline shift or significant mass effect. Electronically Signed   By: Donavan Foil M.D.   On: 10/25/2019 16:18   CT ANGIO NECK W OR WO CONTRAST  Result Date: 10/26/2019 CLINICAL DATA:  Follow-up examination for acute stroke. EXAM: CT ANGIOGRAPHY HEAD AND NECK TECHNIQUE: Multidetector CT imaging of the head and neck was performed using the standard protocol during bolus administration of intravenous contrast. Multiplanar CT image reconstructions and MIPs were obtained to evaluate the vascular anatomy. Carotid stenosis measurements (when applicable) are obtained utilizing NASCET criteria, using the distal internal carotid diameter as the denominator. CONTRAST:  44mL OMNIPAQUE IOHEXOL 350 MG/ML SOLN COMPARISON:  Prior MRI from earlier the same day. FINDINGS: CTA NECK FINDINGS Aortic arch: Visualized aortic arch of normal caliber with normal 3 vessel morphology. Mild atheromatous change within the arch itself. No hemodynamically significant stenosis seen about the origin of the great vessels. Visualized subclavian arteries widely patent. Right carotid system: Right common carotid artery widely patent from its origin to the  bifurcation without stenosis. No significant atheromatous narrowing about the right bifurcation. Multifocal irregularity about the mid-distal right ICA suggestive of FMD. Right ICA patent to the skull base without flow-limiting stenosis, dissection, or occlusion. Left carotid system: Left common carotid artery widely patent from its origin to the bifurcation without stenosis. No significant atheromatous narrowing about the left bifurcation. Multifocal irregularity about the mid-distal left ICA suggestive of FMD. Left ICA patent to the skull base without stenosis, dissection or occlusion. Vertebral arteries: Both vertebral arteries arise from the subclavian arteries. Left vertebral artery dominant. Suspected mild changes of FMD involving the vertebral arteries bilaterally. Vertebral arteries patent to the skull base without stenosis, dissection, or occlusion. Skeleton: No acute osseous abnormality. No discrete lytic or blastic osseous lesions. Other neck: No other acute soft tissue abnormality within the neck. No mass lesion or adenopathy. Upper chest: Right-sided Port-A-Cath in place. 6 mm nodule present at the posterior right upper lobe (series 3, image 51). Additional 5 mm right upper lobe nodule noted (series 3, image 10) additional 4 mm right upper lobe nodule (series 3, image 13). Visualized lungs are otherwise clear. Review of the MIP images confirms the above findings CTA HEAD FINDINGS Anterior circulation: Petrous segments widely patent. Mild atheromatous irregularity throughout the carotid siphons without flow-limiting stenosis. ICA termini well perfused. A1 segments patent bilaterally. Normal anterior communicating artery. Anterior cerebral arteries widely patent to their distal aspects. No M1 stenosis or occlusion. Normal MCA bifurcations. Distal left MCA branches well perfused. On the right, there is occlusion of a proximal right M2 branch, inferior division (series 10, image 472). Opacification of  right  MCA branches distally could be related to subocclusive and/or recanalized thrombus versus collateralization. Posterior circulation: Vertebral arteries patent to the vertebrobasilar junction without stenosis. Both picas patent. Basilar patent to its distal aspect without flow-limiting stenosis. Superior cerebral arteries patent bilaterally. Both PCAs primarily supplied via the basilar well perfused to their distal aspects. Prominent left posterior communicating artery noted. Venous sinuses: Grossly patent allowing for timing the contrast bolus. Anatomic variants: None significant.  No intracranial aneurysm. Review of the MIP images confirms the above findings IMPRESSION: 1. Positive CTA with proximal right M2 occlusion, inferior division. Opacification of distal right MCA branches could be related to subocclusive and/or recanalized thrombus versus collateral flow. Finding consistent with previously identified right MCA territory infarct. 2. Mild atherosclerotic change involving the carotid siphons without stenosis. No other hemodynamically significant or correctable stenosis about the major arterial vasculature of the head and neck. 3. Multifocal irregularity about the mid-distal ICAs bilaterally, suggesting FMD. Suspected mild involvement of both vertebral arteries as well. 4. Several right upper lobe pulmonary nodules measuring up to 6 mm as above, indeterminate. Non-contrast chest CT at 3-6 months is recommended. If the nodules are stable at time of repeat CT, then future CT at 18-24 months (from today's scan) is considered optional for low-risk patients, but is recommended for high-risk patients. This recommendation follows the consensus statement: Guidelines for Management of Incidental Pulmonary Nodules Detected on CT Images: From the Fleischner Society 2017; Radiology 2017; [phone]. Electronically Signed   By: Jeannine Boga M.D.   On: 10/26/2019 00:00   MR BRAIN WO CONTRAST  Result Date:  10/25/2019 CLINICAL DATA:  Slurred speech, abnormal CT EXAM: MRI HEAD WITHOUT CONTRAST TECHNIQUE: Multiplanar, multiecho pulse sequences of the brain and surrounding structures were obtained without intravenous contrast. COMPARISON:  Correlation made with same day CT FINDINGS: Brain: There is cortical/subcortical reduced diffusion in the right MCA territory primarily involving the temporal lobe and posterior insula with some additional parietal greater than frontal involvement. Contralateral foci of reduced diffusion are also present in the left frontal lobe. A few small foci are present within the right cerebellar hemisphere. No evidence of hemorrhage. There is no intracranial mass or significant mass effect. There is no hydrocephalus or extra-axial fluid collection. Ventricles and sulci are normal in size and configuration. Vascular: Major vessel flow voids at the skull base are preserved. Skull and upper cervical spine: Normal marrow signal is preserved. Sinuses/Orbits: Trace mucosal thickening.  Orbits are unremarkable. Other: Sella is unremarkable.  Mastoid air cells are clear. IMPRESSION: Acute and more subacute appearing infarctions involving the right much greater than left MCA territories. There is also minor involvement of the right cerebellum. No evidence of hemorrhage. Electronically Signed   By: Macy Mis M.D.   On: 10/25/2019 19:46   DG Chest Port 1 View  Result Date: 10/25/2019 CLINICAL DATA:  Tachycardia EXAM: PORTABLE CHEST 1 VIEW COMPARISON:  07/27/2019 FINDINGS: Right chest wall port catheter tip overlies the SVC. Persistent elevation of the right hemidiaphragm. No new consolidation or edema. No pleural effusion or pneumothorax. Cardiomediastinal contours are stable. IMPRESSION: No acute process in the chest. Electronically Signed   By: Macy Mis M.D.   On: 10/25/2019 15:25        Scheduled Meds: . aspirin EC  325 mg Oral Daily  . calcium-vitamin D  1 tablet Oral Daily  .  Chlorhexidine Gluconate Cloth  6 each Topical Daily  . enoxaparin (LOVENOX) injection  40 mg Subcutaneous Q24H  . metoprolol  tartrate  25 mg Oral BID  . pantoprazole  40 mg Oral QAC breakfast  . sodium chloride flush  10-40 mL Intracatheter Q12H   Continuous Infusions: . sodium chloride 125 mL/hr at 10/26/19 0038     LOS: 1 day   Time spent= 35 mins    Mckaela Howley Arsenio Loader, MD [facility]  If 7PM-7AM, please contact night-coverage  10/26/2019, 8:55 AM

## 2019-10-26 NOTE — Progress Notes (Signed)
Bilateral lower extremity venous duplex exam completed.  Preliminary results can be found under CV proc under chart review.  10/26/2019 11:16 AM  Bradley [place], K., RDMS, RVT

## 2019-10-27 LAB — BETA-2-GLYCOPROTEIN I ABS, IGG/M/A
Beta-2 Glyco I IgG: <9 GPI IgG units (ref 0–20)
Beta-2-Glycoprotein I IgA: <9 GPI IgA units (ref 0–25)
Beta-2-Glycoprotein I IgM: <9 GPI IgM units (ref 0–32)

## 2019-10-27 LAB — HOMOCYSTEINE: Homocysteine: 14.2 umol/L (ref 0.0–14.5)

## 2019-10-27 LAB — PROTEIN S, TOTAL: Protein S Ag, Total: 90 % (ref 60–150)

## 2019-10-27 LAB — PROTEIN S ACTIVITY: Protein S Activity: 53 % — ABNORMAL LOW (ref 63–140)

## 2019-10-27 LAB — PROTEIN C ACTIVITY: Protein C Activity: 52 % — ABNORMAL LOW (ref 73–180)

## 2019-10-27 MED ORDER — SODIUM CHLORIDE 0.9 % IV SOLN
1.0000 g | INTRAVENOUS | Status: DC
  Administered 2019-10-27 – 2019-10-28 (×4): 1 g via INTRAVENOUS
  Filled 2019-10-27 (×2): qty 10

## 2019-10-27 MED ORDER — ONDANSETRON HCL 4 MG/2ML IJ SOLN
4.0000 mg | Freq: Once | INTRAMUSCULAR | Status: AC
  Administered 2019-10-27: 17:00:00 4 mg via INTRAVENOUS
  Filled 2019-10-27: qty 2

## 2019-10-27 NOTE — Progress Notes (Signed)
Occupational Therapy Treatment Patient Details Name: Melissa Hurley MRN: [id number] DOB: 1965-12-10 Today's Date: 10/27/2019    History of present illness 54 y.o. female recently diagnosed with cholangiocarcinoma with metastases, currently undergoing chemotherapy, presented to ED with slurred and incomprehensible speech.  Brain MRI 3/8: Acute and more subacute appearing infarctions involving the right much greater than left MCA territories. There is also minor involvement of the right cerebellum   OT comments  Pt and spouse stated they really wanted to work on speech upon discharge and declines [place] recommendation. Discussed in length home setup and energy conservation. Advised to remain on main floor majority of the day to decrease fall risk due to fatigue. Pt and spouse expressed understanding and appreciation for handout education.   Follow Up Recommendations  Home health OT    Equipment Recommendations  None recommended by OT    Recommendations for Other Services      Precautions / Restrictions Precautions Precautions: Fall Restrictions Weight Bearing Restrictions: No       Mobility Bed Mobility Overal bed mobility: Modified Independent                Transfers Overall transfer level: Needs assistance Equipment used: None Transfers: Sit to/from Stand Sit to Stand: Supervision         General transfer comment: pt fatigued and remains in bed    Balance Overall balance assessment: Needs assistance Sitting-balance support: No upper extremity supported;Feet supported Sitting balance-Leahy Scale: Good     Standing balance support: No upper extremity supported;During functional activity Standing balance-Leahy Scale: Fair                             ADL either performed or assessed with clinical judgement   ADL                                         General ADL Comments: spouse present and OT worked with patient and husband  using EC handout as a Environmental health practitioner. patient upset throughout session due to assistance being required by huband. educated on bathroom setup, AE that could be used, setup of clothing in one location to help allow decreased DOE and food prep. advised to keep items shoulder to hip height in the home patient needs on daily bases. Spouse able to verbalize instructions back. pt agreeable and with decreased output other than "yes"     Vision       Perception     Praxis      Cognition Arousal/Alertness: Awake/alert Behavior During Therapy: (crying on and off throughout session) Overall Cognitive Status: Impaired/Different from baseline Area of Impairment: Safety/judgement                         Safety/Judgement: Decreased awareness of safety;Decreased awareness of deficits              Exercises     Shoulder Instructions       General Comments      Pertinent Vitals/ Pain       Pain Assessment: Faces Faces Pain Scale: No hurt  Home Living  Prior Functioning/Environment              Frequency  Min 3X/week        Progress Toward Goals  OT Goals(current goals can now be found in the care plan section)  Progress towards OT goals: Progressing toward goals  Acute Rehab OT Goals Patient Stated Goal: to work on her speech OT Goal Formulation: With patient/family Time For Goal Achievement: 11/09/19 Potential to Achieve Goals: Good ADL Goals Pt Will Perform Grooming: with supervision;sitting Pt Will Perform Upper Body Bathing: with supervision;sitting Pt Will Perform Lower Body Bathing: with supervision;sit to/from stand Additional ADL Goal #1: pt will demonstrate 2 energy conservation techniques for adls.  Plan Discharge plan remains appropriate    Co-evaluation                 AM-PAC OT "6 Clicks" Daily Activity     Outcome Measure   Help from another person eating meals?: None Help from  another person taking care of personal grooming?: A Little Help from another person toileting, which includes using toliet, bedpan, or urinal?: A Little Help from another person bathing (including washing, rinsing, drying)?: A Little Help from another person to put on and taking off regular upper body clothing?: A Little Help from another person to put on and taking off regular lower body clothing?: A Little 6 Click Score: 19    End of Session    OT Visit Diagnosis: Unsteadiness on feet (R26.81)   Activity Tolerance Patient tolerated treatment well   Patient Left in bed;with call bell/phone within reach;with bed alarm set;with family/visitor present   Nurse Communication Mobility status;Precautions        Time: 1435-1450 OT Time Calculation (min): 15 min  Charges: OT Treatments $Self Care/Home Management : 8-22 mins   Melissa Hurley, OTR/L  Acute Rehabilitation Services Pager: [phone] Office: [phone] .    Jeri Modena 10/27/2019, 6:28 PM

## 2019-10-27 NOTE — Progress Notes (Signed)
STROKE TEAM PROGRESS NOTE   INTERVAL HISTORY Patient sitting in bed, husband at bedside.  As per husband, patient speech has improvement compared with yesterday.  Speech therapy on board.  Plan to start Eliquis tomorrow.  Vitals:   10/26/19 2353 10/27/19 0406 10/27/19 0820 10/27/19 1152  BP: 120/78 (!) 152/88 (!) 144/90 (!) 131/53  Pulse: 74 93 98 78  Resp: (!) 24 18 15  (!) 25  Temp: 98.3 F (36.8 C) 99.2 F (37.3 C) 98.7 F (37.1 C) 98.6 F (37 C)  TempSrc: Axillary Oral Oral Oral  SpO2: 98% 100% 100% 100%  Weight:      Height:        CBC:  Recent Labs  Lab 10/25/19 1535 10/26/19 0430  WBC 3.9* 4.2  NEUTROABS 1.1*  --   HGB 11.6* 10.0*  HCT 38.2 33.4*  MCV 80.8 80.3  PLT 518* 486*    Basic Metabolic Panel:  Recent Labs  Lab 10/25/19 1535 10/26/19 0430  NA 141  --   K 4.2  --   CL 114*  --   CO2 18*  --   GLUCOSE 133*  --   BUN 10  --   CREATININE 1.00  --   CALCIUM 8.1*  --   MG  --  1.7   Lipid Panel:     Component Value Date/Time   CHOL 108 10/26/2019 0430   TRIG 194 (H) 10/26/2019 0430   HDL 14 (L) 10/26/2019 0430   CHOLHDL 7.7 10/26/2019 0430   VLDL 39 10/26/2019 0430   LDLCALC 55 10/26/2019 0430   HgbA1c:  Lab Results  Component Value Date   HGBA1C 5.7 (H) 10/26/2019   Urine Drug Screen: No results found for: LABOPIA, COCAINSCRNUR, LABBENZ, AMPHETMU, THCU, LABBARB  Alcohol Level No results found for: ETH  IMAGING past 24 hours No results found.  PHYSICAL EXAM   Temp:  [98.3 F (36.8 C)-99.6 F (37.6 C)] 98.6 F (37 C) (03/10 1152) Pulse Rate:  [74-101] 78 (03/10 1152) Resp:  [15-25] 25 (03/10 1152) BP: (118-154)/(53-93) 131/53 (03/10 1152) SpO2:  [98 %-100 %] 100 % (03/10 1152)  General - Well nourished, well developed, in no apparent distress.  Ophthalmologic - fundi not visualized due to noncooperation.  Cardiovascular - Regular rhythm and rate.  Mental Status -  Level of arousal and orientation to time, place, and person  were intact. Follow simple commands.  Severe dysarthria, combined with mild expressive aphasia.  Able to name, able to repeat simple sentences but not complex sentences.  Cranial Nerves II - XII - II - Visual field intact OU. III, IV, VI - Extraocular movements intact. V - Facial sensation intact bilaterally. VII - mild right nasolabial fold flatterning. VIII - Hearing & vestibular intact bilaterally. X - Palate elevates symmetrically. XI - Chin turning & shoulder shrug intact bilaterally. XII - Tongue protrusion intact.  Motor Strength - The patient's strength was normal in all extremities and pronator drift was absent.  Bulk was normal and fasciculations were absent.   Motor Tone - Muscle tone was assessed at the neck and appendages and was normal.  Reflexes - The patient's reflexes were symmetrical in all extremities and she had no pathological reflexes.  Sensory - Light touch, temperature/pinprick were assessed and were symmetrical.    Coordination - The patient had normal movements in the hands and feet with no ataxia or dysmetria.  Tremor was absent.  Gait and Station - deferred.   ASSESSMENT/PLAN Melissa Hurley  is a 54 y.o. female with recently diagnosed cholangiocarcinoma with metastases, currently undergoing chemotherapy,  presenting with 3 days of slurred speech w/ expressive aphasia.   Stroke: Multiple territory, bilateral infarcts, embolic pattern, likely due to hypercoagulability in setting of known cholangiocarcinoma w/ mets  CT head hypodensity/edema R temporal lobe and posterior insula.   MRI  Subacute>acute, R>L MCA territory infarcts. punctate R cerebellar infarcts.   CTA head & neck proximal R M2 inferior division occlusion. Mild ICA siphon atherosclerosis. Mid-distal B ICAs and B VA w/ multifocal irregularity suggesting FMD.   2D Echo EF 60-65%  LE doppler no DVT  LDL 55  HgbA1c 5.7  Lovenox 40 mg sq daily for VTE prophylaxis  No antithrombotic  prior to admission, now on aspirin 325 mg daily. Consider eliquis tomorrow given moderate sized infarct at right MCA territory.   Therapy recommendations:  HH PT, HH OT, [place] SLP  Disposition:  pending   Cholangiocarcinoma with metastases  Diagnosed in 07/2019  Met to spine and pericardial  Severe RUL pulmonary nodules, indeterminate, CT chest recommended.   currently undergoing chemotherapy and radiation  followed at [facility] oncology  Possible hypercoagulable state although neg DVT  Recommend eliquis tomorrow   Blood Pressure . Permissive hypertension (OK if < 220/120) but gradually normalize in 5-7 days . No hx HTN, on no home med (on metoprolol for tachycardia)  Other Stroke Risk Factors  Obesity, Body mass index is 32.89 kg/m., recommend weight loss, diet and exercise as appropriate   Other Active Problems  Mild lactic acidosis  Sinus tachycardia started on BB but pt stopped, resumed during hospitalization   Anemia of chronic disease felt r/t chemo  Nausea prior to admission could be due to chemo  Thrombocytosis, platelet 486  QT prolongation - repeat EKG  Acute cystitis w/o hematuria, UA WBC 21-50, on rocephin  Hospital day # 2  I had long discussion with pt and husband at bedside, updated pt current condition, treatment plan and potential prognosis, and answered all the questions. They expressed understanding and appreciation.   Melissa Hawking, MD PhD Stroke Neurology 10/27/2019 2:40 PM   To contact Stroke Continuity provider, please refer to [email or web address] After hours, contact General Neurology

## 2019-10-27 NOTE — Plan of Care (Signed)
  Problem: Education: Goal: Knowledge of disease or condition will improve Outcome: Progressing Goal: Knowledge of secondary prevention will improve Outcome: Progressing Goal: Knowledge of patient specific risk factors addressed and post discharge goals established will improve Outcome: Progressing Goal: Individualized Educational Video(s) Outcome: Progressing   Problem: Coping: Goal: Will verbalize positive feelings about self Outcome: Progressing Goal: Will identify appropriate support needs Outcome: Progressing   Problem: Health Behavior/Discharge Planning: Goal: Ability to manage health-related needs will improve Outcome: Progressing   Problem: Self-Care: Goal: Ability to participate in self-care as condition permits will improve Outcome: Progressing Goal: Verbalization of feelings and concerns over difficulty with self-care will improve Outcome: Progressing Goal: Ability to communicate needs accurately will improve Outcome: Progressing   Problem: Nutrition: Goal: Risk of aspiration will decrease Outcome: Progressing   Problem: Education: Goal: Knowledge of General Education information will improve Description: Including pain rating scale, medication(s)/side effects and non-pharmacologic comfort measures Outcome: Progressing   Problem: Health Behavior/Discharge Planning: Goal: Ability to manage health-related needs will improve Outcome: Progressing   Problem: Clinical Measurements: Goal: Ability to maintain clinical measurements within normal limits will improve Outcome: Progressing Goal: Will remain free from infection Outcome: Progressing Goal: Diagnostic test results will improve Outcome: Progressing Goal: Respiratory complications will improve Outcome: Progressing Goal: Cardiovascular complication will be avoided Outcome: Progressing   Problem: Activity: Goal: Risk for activity intolerance will decrease Outcome: Progressing   Problem:  Nutrition: Goal: Adequate nutrition will be maintained Outcome: Progressing   Problem: Coping: Goal: Level of anxiety will decrease Outcome: Progressing   Problem: Elimination: Goal: Will not experience complications related to bowel motility Outcome: Progressing Goal: Will not experience complications related to urinary retention Outcome: Progressing   Problem: Pain Managment: Goal: General experience of comfort will improve Outcome: Progressing   Problem: Safety: Goal: Ability to remain free from injury will improve Outcome: Progressing   Problem: Skin Integrity: Goal: Risk for impaired skin integrity will decrease Outcome: Progressing

## 2019-10-27 NOTE — Progress Notes (Signed)
Physical Therapy Treatment Patient Details Name: Melissa Hurley MRN: [id number] DOB: Jul 27, 1966 Today's Date: 10/27/2019    History of Present Illness 54 y.o. female recently diagnosed with cholangiocarcinoma with metastases, currently undergoing chemotherapy, presented to ED with slurred and incomprehensible speech.  Brain MRI 3/8: Acute and more subacute appearing infarctions involving the right much greater than left MCA territories. There is also minor involvement of the right cerebellum    PT Comments    Pt progressing towards physical therapy goals. Ambulating 100 feet with no assistive device and min guard assist; negotiated 2 steps with railings. Demonstrates dynamic balance impairments and decreased endurance. Pt declining use of walker or follow up PT, stating her mobility is at baseline. Has good assist from husband. Will continue to follow acutely.    Follow Up Recommendations  No PT follow up;Supervision for mobility/OOB (pt/family declining HHPT)     Equipment Recommendations  None recommended by PT    Recommendations for Other Services       Precautions / Restrictions Precautions Precautions: Fall Restrictions Weight Bearing Restrictions: No    Mobility  Bed Mobility                  Transfers Overall transfer level: Needs assistance Equipment used: None Transfers: Sit to/from Stand Sit to Stand: Supervision            Ambulation/Gait Ambulation/Gait assistance: Min guard Gait Distance (Feet): 100 Feet Assistive device: None Gait Pattern/deviations: Step-through pattern;Decreased stride length;Wide base of support     General Gait Details: Min guard for stability   Stairs Stairs: Yes Stairs assistance: Supervision Stair Management: Two rails Number of Stairs: 2     Wheelchair Mobility    Modified Rankin (Stroke Patients Only)       Balance Overall balance assessment: Needs assistance Sitting-balance support: No upper  extremity supported;Feet supported Sitting balance-Leahy Scale: Good     Standing balance support: No upper extremity supported;During functional activity Standing balance-Leahy Scale: Fair                              Cognition Arousal/Alertness: Awake/alert Behavior During Therapy: WFL for tasks assessed/performed Overall Cognitive Status: Impaired/Different from baseline Area of Impairment: Safety/judgement                         Safety/Judgement: Decreased awareness of safety;Decreased awareness of deficits            Exercises      General Comments        Pertinent Vitals/Pain Pain Assessment: Faces Pain Score: 0-No pain Faces Pain Scale: No hurt    Home Living                      Prior Function            PT Goals (current goals can now be found in the care plan section) Acute Rehab PT Goals 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 Potential to Achieve Goals: Good Progress towards PT goals: Progressing toward goals    Frequency    Min 4X/week      PT Plan Discharge plan needs to be updated    Co-evaluation              AM-PAC PT "6 Clicks" Mobility   Outcome Measure  Help needed turning from your back to your side while in a flat  bed without using bedrails?: None Help needed moving from lying on your back to sitting on the side of a flat bed without using bedrails?: None Help needed moving to and from a bed to a chair (including a wheelchair)?: None Help needed standing up from a chair using your arms (e.g., wheelchair or bedside chair)?: None Help needed to walk in hospital room?: A Little Help needed climbing 3-5 steps with a railing? : None 6 Click Score: 23    End of Session   Activity Tolerance: Patient tolerated treatment well Patient left: in bed;with call bell/phone within reach;with family/visitor present Nurse Communication: Mobility status PT Visit Diagnosis: Other  abnormalities of gait and mobility (R26.89);Other symptoms and signs involving the nervous system (R29.898)     Time: [id number] PT Time Calculation (min) (ACUTE ONLY): 16 min  Charges:  $Therapeutic Activity: 8-22 mins                       Wyona Almas, PT, DPT Acute Rehabilitation Services Pager [phone] Office [phone]    Deno Etienne 10/27/2019, 5:26 PM

## 2019-10-27 NOTE — Progress Notes (Signed)
  Speech Language Pathology Treatment: Cognitive-Linquistic  Patient Details Name: Melissa Hurley MRN: [id number] DOB: [birth year] Today's Date: 10/27/2019 Time: [id number] SLP Time Calculation (min) (ACUTE ONLY): 15 min  Assessment / Plan / Recommendation Clinical Impression  Pt seen at bedside for skilled ST. Husband present throughout session. Patient with improved speech intelligibility compared to yesterday, she is approximately 70% intelligible at the sentence level. Patient's expressive language appears to be [facility], ST administered the naming subtest of the COGNISTAT (pt score: 8/8) and the repetition subtest of the western aphasia battery bedside record form (score: 9/10). Informally, cognitive-linguistic skills appear WFL.   Patient continues to present with moderate dysarthria and min/mild apraxia of speech. Speech characterized by slower rate, consonant distortion, some oral groping and inconsistency of errors. Patient reports she has been practicing using pacing and slowing rate. ST reviewed strategies with patient to target improved speech intelligibility. ST provided pt/husband with external visual aids with information re: dysarthria and apraxia. Patient will benefit from continued ST to target aforementioned deficits.   HPI HPI: Melissa Hurley is a 54 y.o. female recently diagnosed with cholangiocarcinoma with metastases, currently undergoing chemotherapy, presented to ED with slurred and incomprehensible speech.  Brain MRI 3/8: Acute and more subacute appearing infarctions involving the right much greater than left MCA territories. There is also minor involvement of the right cerebellum.      SLP Plan          Recommendations                   Follow up Recommendations: Home health SLP SLP Visit Diagnosis: Apraxia (R48.2);Dysarthria and anarthria (R47.1)       GO                [name] 10/27/2019, 2:40 PM  [name], M.Ed., [place]  Therapy Acute Rehabilitation [phone]: Acute Rehab office [phone] - pager

## 2019-10-27 NOTE — Progress Notes (Signed)
PROGRESS NOTE    Melissa Hurley  [id number] DOB: [birth year] DOA: 10/25/2019 PCP: Patient, No Pcp Per   Brief Narrative:  54 year old with metastatic cholangiocarcinoma managed by oncology at [facility] on chemotherapy presenting with slurred speech for 3 days.  CTh acute CVA versus mass.  MRI brain showed acute left MCA CVA.  Continue aspirin 325 mg daily, transition to Eliquis upon discharge.   Assessment & Plan:   Principal Problem:   Acute CVA (cerebrovascular accident) [facility]) Active Problems:   Adenocarcinoma of unknown primary ([place])   Lactic acidosis   Metabolic acidosis   Anemia  Slurred speech secondary to acute left MCA CVA -Stroke protocol, continues to have dysarthria -MRI brain without contrast-confirmed CVA.  CT head-CVA -CTA head and neck-proximal M2 occlusion, distal MCA branches subocclusive multifocal irregularities of mid distal ICA, several pulmonary nodules -ASA given 25 mg now, plans to switch to Eliquis twice daily tomorrow. -Echocardiogram-EF 60 to 65% -LDL-55, A1c-5.7 -Frequent neuro checks -Atorvastatin PO within 24 hrs.  -Risk factor modification -Consult Neurology  Acute cystitis without hematuria -Start IV Rocephin.  Mild lactic acidosis -Resolved  Metastatic cholangiocarcinoma -On chemotherapy.  Follows at [facility]  Anemia of chronic disease Thrombocytosis -Secondary to malignancy and chemotherapy  Prolonged QTC-avoid QTC prolonging drugs  DVT prophylaxis: Lovenox Code Status: Full code Family Communication:   Disposition Plan:   Patient From= home  Patient Anticipated D/C place= to be determined  Barriers= maintain hospital stay for 24 hours, dysarthria appears to be slowly improving.  If symptoms remain stable, will transition her to Eliquis tomorrow morning and then discharge her.  Spoke with neurology and in agreement with plan.   Subjective: No complaints this morning but still has dysarthria.  She is very happy  that this has slightly improved compared to yesterday and she is able to somewhat communicate without having to write down messages piece of paper.  Review of Systems Otherwise negative except as per HPI, including: General = no fevers, chills, dizziness,  fatigue HEENT/EYES = negative for loss of vision, double vision, blurred vision,  sore throa Cardiovascular= negative for chest pain, palpitation Respiratory/lungs= negative for shortness of breath, cough, wheezing; hemoptysis,  Gastrointestinal= negative for nausea, vomiting, abdominal pain Genitourinary= negative for Dysuria MSK = Negative for arthralgia, myalgias Neurology= Negative for headache, numbness, tingling  Psychiatry= Negative for suicidal and homocidal ideation Skin= Negative for Rash   Examination: Constitutional: Not in acute distress Respiratory: Clear to auscultation bilaterally Cardiovascular: Normal sinus rhythm, no rubs Abdomen: Nontender nondistended good bowel sounds Musculoskeletal: No edema noted Skin: No rashes seen Neurologic: CN 2-12 grossly intact.  Dysarthric speech Psychiatric: Normal judgment and insight. Alert and oriented x 3. Normal mood.      Objective: Vitals:   10/26/19 2353 10/27/19 0406 10/27/19 0820 10/27/19 1152  BP: 120/78 (!) 152/88 (!) 144/90 (!) 131/53  Pulse: 74 93 98 78  Resp: (!) 24 18 15  (!) 25  Temp: 98.3 F (36.8 C) 99.2 F (37.3 C) 98.7 F (37.1 C) 98.6 F (37 C)  TempSrc: Axillary Oral Oral Oral  SpO2: 98% 100% 100% 100%  Weight:      Height:       No intake or output data in the 24 hours ending 10/27/19 1200 Filed Weights   10/25/19 1430  Weight: 95.3 kg     Data Reviewed:   CBC: Recent Labs  Lab 10/25/19 1535 10/26/19 0430  WBC 3.9* 4.2  NEUTROABS 1.1*  --   HGB 11.6*  10.0*  HCT 38.2 33.4*  MCV 80.8 80.3  PLT 518* [id number]*   Basic Metabolic Panel: Recent Labs  Lab 10/25/19 1535 10/26/19 0430  NA 141  --   K 4.2  --   CL 114*  --   CO2 18*   --   GLUCOSE 133*  --   BUN 10  --   CREATININE 1.00  --   CALCIUM 8.1*  --   MG  --  1.7   GFR: Estimated Creatinine Clearance: 78 mL/min (by C-G formula based on SCr of 1 mg/dL). Liver Function Tests: Recent Labs  Lab 10/25/19 1535  AST 25  ALT 26  ALKPHOS 105  BILITOT 0.7  PROT 6.3*  ALBUMIN 2.6*   Recent Labs  Lab 10/25/19 1535  LIPASE 16   Recent Labs  Lab 10/25/19 1535  AMMONIA 13   Coagulation Profile: Recent Labs  Lab 10/25/19 1535  INR 1.4*   Cardiac Enzymes: No results for input(s): CKTOTAL, CKMB, CKMBINDEX, TROPONINI in the last 168 hours. BNP (last 3 results) No results for input(s): PROBNP in the last 8760 hours. HbA1C: Recent Labs    10/26/19 0430  HGBA1C 5.7*   CBG: No results for input(s): GLUCAP in the last 168 hours. Lipid Profile: Recent Labs    10/26/19 0430  CHOL 108  HDL 14*  LDLCALC 55  TRIG 194*  CHOLHDL 7.7   Thyroid Function Tests: No results for input(s): TSH, T4TOTAL, FREET4, T3FREE, THYROIDAB in the last 72 hours. Anemia Panel: No results for input(s): VITAMINB12, FOLATE, FERRITIN, TIBC, IRON, RETICCTPCT in the last 72 hours. Sepsis Labs: Recent Labs  Lab 10/25/19 1535 10/26/19 0430  LATICACIDVEN 2.2* 1.8    Recent Results (from the past 240 hour(s))  SARS CORONAVIRUS 2 (TAT 6-24 HRS) Nasopharyngeal Nasopharyngeal Swab     Status: None   Collection Time: 10/25/19  9:31 PM   Specimen: Nasopharyngeal Swab  Result Value Ref Range Status   SARS Coronavirus 2 NEGATIVE NEGATIVE Final    Comment: (NOTE) SARS-CoV-2 target nucleic acids are NOT DETECTED. The SARS-CoV-2 RNA is generally detectable in upper and lower respiratory specimens during the acute phase of infection. Negative results do not preclude SARS-CoV-2 infection, do not rule out co-infections with other pathogens, and should not be used as the sole basis for treatment or other patient management decisions. Negative results must be combined with  clinical observations, patient history, and epidemiological information. The expected result is Negative. Fact Sheet for Patients: SugarRoll.be Fact Sheet for Healthcare Providers: https://www.woods-mathews.com/ This test is not yet approved or cleared by the [place] FDA and  has been authorized for detection and/or diagnosis of SARS-CoV-2 by FDA under an Emergency Use Authorization (EUA). This EUA will remain  in effect (meaning this test can be used) for the duration of the COVID-19 declaration under Section 56 4(b)(1) of the Act, 21 U.S.C. section 360bbb-3(b)(1), unless the authorization is terminated or revoked sooner. Performed at [facility], [place] [address] [place], [place] [address]          Radiology Studies: CT ANGIO HEAD W OR WO CONTRAST  Result Date: 10/26/2019 CLINICAL DATA:  Follow-up examination for acute stroke. EXAM: CT ANGIOGRAPHY HEAD AND NECK TECHNIQUE: Multidetector CT imaging of the head and neck was performed using the standard protocol during bolus administration of intravenous contrast. Multiplanar CT image reconstructions and MIPs were obtained to evaluate the vascular anatomy. Carotid stenosis measurements (when applicable) are obtained utilizing NASCET criteria, using the distal  internal carotid diameter as the denominator. CONTRAST:  42mL OMNIPAQUE IOHEXOL 350 MG/ML SOLN COMPARISON:  Prior MRI from earlier the same day. FINDINGS: CTA NECK FINDINGS Aortic arch: Visualized aortic arch of normal caliber with normal 3 vessel morphology. Mild atheromatous change within the arch itself. No hemodynamically significant stenosis seen about the origin of the great vessels. Visualized subclavian arteries widely patent. Right carotid system: Right common carotid artery widely patent from its origin to the bifurcation without stenosis. No significant atheromatous narrowing about the right bifurcation. Multifocal  irregularity about the mid-distal right ICA suggestive of FMD. Right ICA patent to the skull base without flow-limiting stenosis, dissection, or occlusion. Left carotid system: Left common carotid artery widely patent from its origin to the bifurcation without stenosis. No significant atheromatous narrowing about the left bifurcation. Multifocal irregularity about the mid-distal left ICA suggestive of FMD. Left ICA patent to the skull base without stenosis, dissection or occlusion. Vertebral arteries: Both vertebral arteries arise from the subclavian arteries. Left vertebral artery dominant. Suspected mild changes of FMD involving the vertebral arteries bilaterally. Vertebral arteries patent to the skull base without stenosis, dissection, or occlusion. Skeleton: No acute osseous abnormality. No discrete lytic or blastic osseous lesions. Other neck: No other acute soft tissue abnormality within the neck. No mass lesion or adenopathy. Upper chest: Right-sided Port-A-Cath in place. 6 mm nodule present at the posterior right upper lobe (series 3, image 51). Additional 5 mm right upper lobe nodule noted (series 3, image 10) additional 4 mm right upper lobe nodule (series 3, image 13). Visualized lungs are otherwise clear. Review of the MIP images confirms the above findings CTA HEAD FINDINGS Anterior circulation: Petrous segments widely patent. Mild atheromatous irregularity throughout the carotid siphons without flow-limiting stenosis. ICA termini well perfused. A1 segments patent bilaterally. Normal anterior communicating artery. Anterior cerebral arteries widely patent to their distal aspects. No M1 stenosis or occlusion. Normal MCA bifurcations. Distal left MCA branches well perfused. On the right, there is occlusion of a proximal right M2 branch, inferior division (series 10, image 472). Opacification of right MCA branches distally could be related to subocclusive and/or recanalized thrombus versus  collateralization. Posterior circulation: Vertebral arteries patent to the vertebrobasilar junction without stenosis. Both picas patent. Basilar patent to its distal aspect without flow-limiting stenosis. Superior cerebral arteries patent bilaterally. Both PCAs primarily supplied via the basilar well perfused to their distal aspects. Prominent left posterior communicating artery noted. Venous sinuses: Grossly patent allowing for timing the contrast bolus. Anatomic variants: None significant.  No intracranial aneurysm. Review of the MIP images confirms the above findings IMPRESSION: 1. Positive CTA with proximal right M2 occlusion, inferior division. Opacification of distal right MCA branches could be related to subocclusive and/or recanalized thrombus versus collateral flow. Finding consistent with previously identified right MCA territory infarct. 2. Mild atherosclerotic change involving the carotid siphons without stenosis. No other hemodynamically significant or correctable stenosis about the major arterial vasculature of the head and neck. 3. Multifocal irregularity about the mid-distal ICAs bilaterally, suggesting FMD. Suspected mild involvement of both vertebral arteries as well. 4. Several right upper lobe pulmonary nodules measuring up to 6 mm as above, indeterminate. Non-contrast chest CT at 3-6 months is recommended. If the nodules are stable at time of repeat CT, then future CT at 18-24 months (from today's scan) is considered optional for low-risk patients, but is recommended for high-risk patients. This recommendation follows the consensus statement: Guidelines for Management of Incidental Pulmonary Nodules Detected on CT Images: From the  Fleischner Society 2017; Radiology 2017; [phone]. Electronically Signed   By: Jeannine Boga M.D.   On: 10/26/2019 00:00   CT Head Wo Contrast  Result Date: 10/25/2019 CLINICAL DATA:  Speech difficulty, aphasia history of liver cancer EXAM: CT HEAD WITHOUT  CONTRAST TECHNIQUE: Contiguous axial images were obtained from the base of the skull through the vertex without intravenous contrast. COMPARISON:  None. FINDINGS: Brain: Hypodensity/edema within the right temporal lobe and insula. Negative for hemorrhage. No midline shift. The ventricles are nonenlarged. Vascular: No hyperdense vessels. Scattered calcifications at the carotid siphons. Vertebral artery calcification. Skull: Normal. Negative for fracture or focal lesion. Sinuses/Orbits: No acute finding. Other: None IMPRESSION: 1. Hypodensity/edema within the right temporal lobe and posterior insula, favor acute to subacute infarct over mass/metastatic disease, though MRI would be more definitive. 2. Negative for hemorrhage, midline shift or significant mass effect. Electronically Signed   By: Donavan Foil M.D.   On: 10/25/2019 16:18   CT ANGIO NECK W OR WO CONTRAST  Result Date: 10/26/2019 CLINICAL DATA:  Follow-up examination for acute stroke. EXAM: CT ANGIOGRAPHY HEAD AND NECK TECHNIQUE: Multidetector CT imaging of the head and neck was performed using the standard protocol during bolus administration of intravenous contrast. Multiplanar CT image reconstructions and MIPs were obtained to evaluate the vascular anatomy. Carotid stenosis measurements (when applicable) are obtained utilizing NASCET criteria, using the distal internal carotid diameter as the denominator. CONTRAST:  72mL OMNIPAQUE IOHEXOL 350 MG/ML SOLN COMPARISON:  Prior MRI from earlier the same day. FINDINGS: CTA NECK FINDINGS Aortic arch: Visualized aortic arch of normal caliber with normal 3 vessel morphology. Mild atheromatous change within the arch itself. No hemodynamically significant stenosis seen about the origin of the great vessels. Visualized subclavian arteries widely patent. Right carotid system: Right common carotid artery widely patent from its origin to the bifurcation without stenosis. No significant atheromatous narrowing about  the right bifurcation. Multifocal irregularity about the mid-distal right ICA suggestive of FMD. Right ICA patent to the skull base without flow-limiting stenosis, dissection, or occlusion. Left carotid system: Left common carotid artery widely patent from its origin to the bifurcation without stenosis. No significant atheromatous narrowing about the left bifurcation. Multifocal irregularity about the mid-distal left ICA suggestive of FMD. Left ICA patent to the skull base without stenosis, dissection or occlusion. Vertebral arteries: Both vertebral arteries arise from the subclavian arteries. Left vertebral artery dominant. Suspected mild changes of FMD involving the vertebral arteries bilaterally. Vertebral arteries patent to the skull base without stenosis, dissection, or occlusion. Skeleton: No acute osseous abnormality. No discrete lytic or blastic osseous lesions. Other neck: No other acute soft tissue abnormality within the neck. No mass lesion or adenopathy. Upper chest: Right-sided Port-A-Cath in place. 6 mm nodule present at the posterior right upper lobe (series 3, image 51). Additional 5 mm right upper lobe nodule noted (series 3, image 10) additional 4 mm right upper lobe nodule (series 3, image 13). Visualized lungs are otherwise clear. Review of the MIP images confirms the above findings CTA HEAD FINDINGS Anterior circulation: Petrous segments widely patent. Mild atheromatous irregularity throughout the carotid siphons without flow-limiting stenosis. ICA termini well perfused. A1 segments patent bilaterally. Normal anterior communicating artery. Anterior cerebral arteries widely patent to their distal aspects. No M1 stenosis or occlusion. Normal MCA bifurcations. Distal left MCA branches well perfused. On the right, there is occlusion of a proximal right M2 branch, inferior division (series 10, image 472). Opacification of right MCA branches distally could be related to  subocclusive and/or recanalized  thrombus versus collateralization. Posterior circulation: Vertebral arteries patent to the vertebrobasilar junction without stenosis. Both picas patent. Basilar patent to its distal aspect without flow-limiting stenosis. Superior cerebral arteries patent bilaterally. Both PCAs primarily supplied via the basilar well perfused to their distal aspects. Prominent left posterior communicating artery noted. Venous sinuses: Grossly patent allowing for timing the contrast bolus. Anatomic variants: None significant.  No intracranial aneurysm. Review of the MIP images confirms the above findings IMPRESSION: 1. Positive CTA with proximal right M2 occlusion, inferior division. Opacification of distal right MCA branches could be related to subocclusive and/or recanalized thrombus versus collateral flow. Finding consistent with previously identified right MCA territory infarct. 2. Mild atherosclerotic change involving the carotid siphons without stenosis. No other hemodynamically significant or correctable stenosis about the major arterial vasculature of the head and neck. 3. Multifocal irregularity about the mid-distal ICAs bilaterally, suggesting FMD. Suspected mild involvement of both vertebral arteries as well. 4. Several right upper lobe pulmonary nodules measuring up to 6 mm as above, indeterminate. Non-contrast chest CT at 3-6 months is recommended. If the nodules are stable at time of repeat CT, then future CT at 18-24 months (from today's scan) is considered optional for low-risk patients, but is recommended for high-risk patients. This recommendation follows the consensus statement: Guidelines for Management of Incidental Pulmonary Nodules Detected on CT Images: From the Fleischner Society 2017; Radiology 2017; [id number]. Electronically Signed   By: Jeannine Boga M.D.   On: 10/26/2019 00:00   MR BRAIN WO CONTRAST  Result Date: 10/25/2019 CLINICAL DATA:  Slurred speech, abnormal CT EXAM: MRI HEAD WITHOUT  CONTRAST TECHNIQUE: Multiplanar, multiecho pulse sequences of the brain and surrounding structures were obtained without intravenous contrast. COMPARISON:  Correlation made with same day CT FINDINGS: Brain: There is cortical/subcortical reduced diffusion in the right MCA territory primarily involving the temporal lobe and posterior insula with some additional parietal greater than frontal involvement. Contralateral foci of reduced diffusion are also present in the left frontal lobe. A few small foci are present within the right cerebellar hemisphere. No evidence of hemorrhage. There is no intracranial mass or significant mass effect. There is no hydrocephalus or extra-axial fluid collection. Ventricles and sulci are normal in size and configuration. Vascular: Major vessel flow voids at the skull base are preserved. Skull and upper cervical spine: Normal marrow signal is preserved. Sinuses/Orbits: Trace mucosal thickening.  Orbits are unremarkable. Other: Sella is unremarkable.  Mastoid air cells are clear. IMPRESSION: Acute and more subacute appearing infarctions involving the right much greater than left MCA territories. There is also minor involvement of the right cerebellum. No evidence of hemorrhage. Electronically Signed   By: Macy Mis M.D.   On: 10/25/2019 19:46   DG Chest Port 1 View  Result Date: 10/25/2019 CLINICAL DATA:  Tachycardia EXAM: PORTABLE CHEST 1 VIEW COMPARISON:  07/27/2019 FINDINGS: Right chest wall port catheter tip overlies the SVC. Persistent elevation of the right hemidiaphragm. No new consolidation or edema. No pleural effusion or pneumothorax. Cardiomediastinal contours are stable. IMPRESSION: No acute process in the chest. Electronically Signed   By: Macy Mis M.D.   On: 10/25/2019 15:25   ECHOCARDIOGRAM COMPLETE  Result Date: 10/26/2019    ECHOCARDIOGRAM REPORT   Patient Name:   Melissa Hurley Date of Exam: 10/26/2019 Medical Rec #:  [id number]       Height:       67.0 in  Accession #:    [id number]  Weight:       210.0 lb Date of Birth:  [birth year]      BSA:          2.065 m Patient Age:    88 years        BP:           139/95 mmHg Patient Gender: F               HR:           91 bpm. Exam Location:  Inpatient Procedure: 2D Echo Indications:    stroke 434.91  History:        Patient has prior history of Echocardiogram examinations, most                 recent 09/22/2019. Cancer. pericardial effusion.  Sonographer:    Jannett Celestine RDCS (AE) Referring Phys: [id number] VASUNDHRA [place]  1. Left ventricular ejection fraction, by estimation, is 60 to 65%. The left ventricle has normal function. The left ventricle has no regional wall motion abnormalities. Left ventricular diastolic parameters were normal.  2. Right ventricular systolic function is normal. The right ventricular size is normal.  3. The mitral valve is normal in structure. No evidence of mitral valve regurgitation. No evidence of mitral stenosis.  4. The aortic valve is normal in structure. Aortic valve regurgitation is not visualized. No aortic stenosis is present.  5. The inferior vena cava is normal in size with greater than 50% respiratory variability, suggesting right atrial pressure of 3 mmHg. Comparison(s): Prior images reviewed side by side. Changes from prior study are noted. The pericardial effusion is smaller compared to 09/22/2019. FINDINGS  Left Ventricle: Left ventricular ejection fraction, by estimation, is 60 to 65%. The left ventricle has normal function. The left ventricle has no regional wall motion abnormalities. The left ventricular internal cavity size was normal in size. There is  no left ventricular hypertrophy. Left ventricular diastolic parameters were normal. Right Ventricle: The right ventricular size is normal. No increase in right ventricular wall thickness. Right ventricular systolic function is normal. Left Atrium: Left atrial size was normal in size. Right Atrium: Right atrial  size was normal in size. Pericardium: Trivial pericardial effusion is present. The pericardial effusion is circumferential. Mitral Valve: The mitral valve is normal in structure. Normal mobility of the mitral valve leaflets. No evidence of mitral valve regurgitation. No evidence of mitral valve stenosis. Tricuspid Valve: The tricuspid valve is normal in structure. Tricuspid valve regurgitation is not demonstrated. No evidence of tricuspid stenosis. Aortic Valve: The aortic valve is normal in structure. Aortic valve regurgitation is not visualized. No aortic stenosis is present. Pulmonic Valve: The pulmonic valve was normal in structure. Pulmonic valve regurgitation is not visualized. No evidence of pulmonic stenosis. Aorta: The aortic root is normal in size and structure. Venous: The inferior vena cava is normal in size with greater than 50% respiratory variability, suggesting right atrial pressure of 3 mmHg. IAS/Shunts: No atrial level shunt detected by color flow Doppler.  LEFT VENTRICLE PLAX 2D LVIDd:         3.80 cm LVIDs:         2.40 cm LV PW:         1.00 cm LV IVS:        0.80 cm LVOT diam:     1.80 cm LV SV:         47 LV SV Index:   23 LVOT Area:     2.54  cm  RIGHT VENTRICLE RV S prime:     13.40 cm/s TAPSE (M-mode): 1.7 cm LEFT ATRIUM           Index       RIGHT ATRIUM           Index LA diam:      3.00 cm 1.45 cm/m  RA Area:     11.10 cm LA Vol (A4C): 35.0 ml 16.95 ml/m RA Volume:   21.70 ml  10.51 ml/m  AORTIC VALVE LVOT Vmax:   94.60 cm/s LVOT Vmean:  74.800 cm/s LVOT VTI:    0.183 m  AORTA Ao Root diam: 3.00 cm MITRAL VALVE MV Area (PHT): 2.91 cm    SHUNTS MV Decel Time: 261 msec    Systemic VTI:  0.18 m MV E velocity: 69.40 cm/s  Systemic Diam: 1.80 cm Dani Gobble Croitoru MD Electronically signed by Sanda Klein MD Signature Date/Time: 10/26/2019/2:14:48 PM    Final    VAS [place] LOWER EXTREMITY VENOUS (DVT)  Result Date: 10/26/2019  Lower Venous DVTStudy Indications: Stroke.  Risk Factors: Cancer  Mets. Limitations: Poor ultrasound/tissue interface. Comparison Study: No prior exam. Performing Technologist: Baldwin Crown ARDMS, RVT  Examination Guidelines: A complete evaluation includes B-mode imaging, spectral Doppler, color Doppler, and power Doppler as needed of all accessible portions of each vessel. Bilateral testing is considered an integral part of a complete examination. Limited examinations for reoccurring indications may be performed as noted. The reflux portion of the exam is performed with the patient in reverse Trendelenburg.  +---------+---------------+---------+-----------+----------+------------------+ RIGHT    CompressibilityPhasicitySpontaneityPropertiesThrombus Aging     +---------+---------------+---------+-----------+----------+------------------+ CFV      Full           Yes      Yes                                     +---------+---------------+---------+-----------+----------+------------------+ SFJ      Full                                                            +---------+---------------+---------+-----------+----------+------------------+ FV Prox  Full                                                            +---------+---------------+---------+-----------+----------+------------------+ FV Mid   Full                                                            +---------+---------------+---------+-----------+----------+------------------+ FV DistalFull                                         Visualized with  color              +---------+---------------+---------+-----------+----------+------------------+ PFV      Full                                                            +---------+---------------+---------+-----------+----------+------------------+ POP      Full           Yes      Yes                                      +---------+---------------+---------+-----------+----------+------------------+ PTV      Full                                         Visualized with                                                          color              +---------+---------------+---------+-----------+----------+------------------+ PERO     Full                                         Visualized with                                                          color              +---------+---------------+---------+-----------+----------+------------------+   +---------+---------------+---------+-----------+----------+------------------+ LEFT     CompressibilityPhasicitySpontaneityPropertiesThrombus Aging     +---------+---------------+---------+-----------+----------+------------------+ CFV      Full           Yes      Yes                                     +---------+---------------+---------+-----------+----------+------------------+ SFJ      Full                                                            +---------+---------------+---------+-----------+----------+------------------+ FV Prox  Full                                                            +---------+---------------+---------+-----------+----------+------------------+ FV Mid   Full                                                            +---------+---------------+---------+-----------+----------+------------------+  FV DistalFull                                         Visualized with                                                          color              +---------+---------------+---------+-----------+----------+------------------+ PFV      Full                                                            +---------+---------------+---------+-----------+----------+------------------+ POP      Full           Yes      Yes                                      +---------+---------------+---------+-----------+----------+------------------+ PTV      Full                                         Visualized with                                                          color              +---------+---------------+---------+-----------+----------+------------------+ PERO     Full                                         Visualized with                                                          color              +---------+---------------+---------+-----------+----------+------------------+    Summary: BILATERAL: - No evidence of deep vein thrombosis seen in the lower extremities, bilaterally.  RIGHT: - No cystic structure found in the popliteal fossa.  LEFT: - No cystic structure found in the popliteal fossa.  *See table(s) above for measurements and observations. Electronically signed by Harold Barban MD on 10/26/2019 at 5:49:53 PM.    Final         Scheduled Meds: . aspirin EC  325 mg Oral Daily  . calcium-vitamin D  1 tablet Oral Daily  . Chlorhexidine Gluconate Cloth  6 each Topical Daily  . enoxaparin (LOVENOX) injection  40 mg Subcutaneous Q24H  . metoprolol tartrate  25 mg Oral BID  . pantoprazole  40 mg Oral QAC breakfast  . sodium chloride flush  10-40 mL Intracatheter Q12H   Continuous Infusions: . sodium chloride 125 mL/hr at 10/27/19 0817  . cefTRIAXone (ROCEPHIN)  IV 1 g (10/27/19 1023)     LOS: 2 days   Time spent= 35 mins    Kynsley Whitehouse Arsenio Loader, MD [facility]  If 7PM-7AM, please contact night-coverage  10/27/2019, 12:00 PM

## 2019-10-27 NOTE — TOC Initial Note (Signed)
Transition of Care [facility]) - Initial/Assessment Note    Patient Details  Name: Melissa Hurley MRN: [id number] Date of Birth: 1966/04/21  Transition of Care [facility]) CM/SW Contact:    Pollie Friar, RN Phone Number: 10/27/2019, 11:12 AM  Clinical Narrative:                 Pt with orders for [facility] services. CM provided pt and spouse choice and [facility] decided on. Tommi Rumps with [facility] accepted the referral.  Awaiting PT for DME needs.  Spouse to provide supervision at home and transportation home when medically ready.  PCP: Dr Jimmy Footman  Expected Discharge Plan: [place] Barriers to Discharge: Continued Medical Work up   Patient Goals and CMS Choice   CMS Medicare.gov Compare Post Acute Care list provided to:: Patient Represenative (must comment) Choice offered to / list presented to : Patient, Spouse  Expected Discharge Plan and Services Expected Discharge Plan: [place]   Discharge Planning Services: CM Consult Post Acute Care Choice: [place] arrangements for the past 2 months: Single Family Home                           HH Arranged: PT, OT, Speech Therapy HH Agency: [place] Date [facility] Agency Contacted: 10/27/19 Time HH Agency Contacted: 1111 Representative spoke with at [place]: Tommi Rumps  Prior Living Arrangements/Services Living arrangements for the past 2 months: [place] Lives with:: Spouse Patient language and need for interpreter reviewed:: Yes Do you feel safe going back to the place where you live?: Yes      Need for Family Participation in Patient Care: Yes (Comment) Care giver support system in place?: Yes (comment)(spouse)   Criminal Activity/Legal Involvement Pertinent to Current Situation/Hospitalization: No - Comment as needed  Activities of Daily Living Home Assistive Devices/Equipment: None ADL Screening (condition at time of admission) Patient's cognitive ability adequate to safely  complete daily activities?: Yes Is the patient deaf or have difficulty hearing?: No Does the patient have difficulty seeing, even when wearing glasses/contacts?: No Does the patient have difficulty concentrating, remembering, or making decisions?: No Patient able to express need for assistance with ADLs?: Yes Does the patient have difficulty dressing or bathing?: No Independently performs ADLs?: Yes (appropriate for developmental age) Does the patient have difficulty walking or climbing stairs?: Yes Weakness of Legs: Left Weakness of Arms/Hands: None  Permission Sought/Granted                  Emotional Assessment Appearance:: Appears stated age Attitude/Demeanor/Rapport: (aphasia) Affect (typically observed): Accepting Orientation: : Oriented to Self, Oriented to Place, Oriented to  Time, Oriented to Situation   Psych Involvement: No (comment)  Admission diagnosis:  Dysarthria [R47.1] Expressive aphasia [R47.01] Acute CVA (cerebrovascular accident) [facility]) [I63.9] Cerebrovascular accident (CVA) due to bilateral embolism of middle cerebral arteries ([place]) [I63.413] Patient Active Problem List   Diagnosis Date Noted  . Acute CVA (cerebrovascular accident) ([place]) 10/25/2019  . Lactic acidosis 10/25/2019  . Metabolic acidosis 123456  . Anemia 10/25/2019  . Adenocarcinoma of unknown primary ([place]) 08/27/2019  . Fibroid uterus 08/27/2019   PCP:  Patient, No Pcp Per Pharmacy:   [facility] [address], [place] [address] [place] [place] [address] Phone: [phone] Fax: [phone]     Social Determinants of Health (SDOH) Interventions    Readmission Risk Interventions No flowsheet data found.

## 2019-10-28 LAB — LUPUS ANTICOAGULANT PANEL
DRVVT: 57.8 s — ABNORMAL HIGH (ref 0.0–47.0)
PTT Lupus Anticoagulant: 41 s (ref 0.0–51.9)

## 2019-10-28 LAB — DRVVT MIX: dRVVT Mix: 42 s — ABNORMAL HIGH (ref 0.0–40.4)

## 2019-10-28 LAB — CARDIOLIPIN ANTIBODIES, IGG, IGM, IGA
Anticardiolipin IgA: <9 APL U/mL (ref 0–11)
Anticardiolipin IgG: <9 GPL U/mL (ref 0–14)
Anticardiolipin IgM: 11 MPL U/mL (ref 0–12)

## 2019-10-28 LAB — DRVVT CONFIRM: dRVVT Confirm: 1.3 ratio — ABNORMAL HIGH (ref 0.8–1.2)

## 2019-10-28 LAB — PROTEIN C, TOTAL: Protein C, Total: 49 % — ABNORMAL LOW (ref 60–150)

## 2019-10-28 MED ORDER — APIXABAN 5 MG PO TABS: 5.0000 mg | ORAL_TABLET | Freq: Two times a day (BID) | ORAL | 0 refills | Status: DC

## 2019-10-28 MED ORDER — APIXABAN 5 MG PO TABS: 5.0000 mg | ORAL_TABLET | Freq: Two times a day (BID) | ORAL | Status: DC

## 2019-10-28 MED ORDER — HEPARIN SOD (PORK) LOCK FLUSH 100 UNIT/ML IV SOLN
500.0000 [IU] | INTRAVENOUS | Status: AC | PRN
  Administered 2019-10-28: 11:00:00 500 [IU]
  Filled 2019-10-28: qty 5

## 2019-10-28 MED ORDER — CEPHALEXIN 500 MG PO CAPS: 500.0000 mg | ORAL_CAPSULE | Freq: Four times a day (QID) | ORAL | 0 refills | Status: DC

## 2019-10-28 MED ORDER — CEPHALEXIN 500 MG PO CAPS: 500.0000 mg | ORAL_CAPSULE | Freq: Four times a day (QID) | ORAL | 0 refills | Status: AC

## 2019-10-28 MED FILL — ELIQUIS 5 MG TABLET: 5 | 30 days supply | Qty: 60 | Fill #0

## 2019-10-28 MED FILL — CEPHALEXIN 500 MG CAPS: 500 | 4 days supply | Qty: 16 | Fill #0

## 2019-10-28 NOTE — TOC Benefit Eligibility Note (Signed)
Transition of Care [facility]) Benefit Eligibility Note    Patient Details  Name: Melissa Hurley MRN: [id number] Date of Birth: 01-24-1966   Medication/Dose: Arne Cleveland  5 MG BID  Covered?: Yes  Tier: (NO  TIER)  Prescription Coverage [facility]  Spoke with Person/Company/Phone Number:: SANDY  @ [facility] RX #  [phone]  Co-Pay: Johnsie Kindred  Prior Approval: (NO DEDUCTIBLE  and  NO OUT-OF-POCKET)          Memory Argue Phone Number: 10/28/2019, 10:19 AM

## 2019-10-28 NOTE — TOC Transition Note (Signed)
Transition of Care [facility]) - CM/SW Discharge Note   Patient Details  Name: Melissa Hurley MRN: [id number] Date of Birth: Apr 24, 1966  [facility]) CM/SW Contact:  Pollie Friar, RN Phone Number: 10/28/2019, 11:19 AM   Clinical Narrative:    Pt and spouse asking to have only OT/ST for [facility]. Cory with [facility] aware.  Pt declining walker for home.  CM did provide her $10 co pay card for Eliquis and meds to be delivered to room from [place] for d/c.  Spouse to provide transport home.    Final next level of care: Home w Home Health Services Barriers to Discharge: No Barriers Identified   Patient Goals and CMS Choice   CMS Medicare.gov Compare Post Acute Care list provided to:: Patient Represenative (must comment) Choice offered to / list presented to : Patient, Spouse  Discharge Placement                       Discharge Plan and Services   Discharge Planning Services: CM Consult Post Acute Care Choice: Home Health                    HH Arranged: OT, Speech Therapy HH Agency: [place] Date [place]: 10/27/19 Time [place]: 1111 Representative spoke with at [place]: [place] ([place]) Interventions     Readmission Risk Interventions No flowsheet data found.

## 2019-10-28 NOTE — Plan of Care (Signed)
Patient stable, discussed POC with patient, agreeable with plan, denies question/concerns at this time.  

## 2019-10-28 NOTE — Progress Notes (Addendum)
Physical Therapy Treatment Patient Details Name: Melissa Hurley MRN: [id number] DOB: 1965/10/26 Today's Date: 10/28/2019    History of Present Illness 54 y.o. female recently diagnosed with cholangiocarcinoma with metastases, currently undergoing chemotherapy, presented to ED with slurred and incomprehensible speech.  Brain MRI 3/8: Acute and more subacute appearing infarctions involving the right much greater than left MCA territories. There is also minor involvement of the right cerebellum    PT Comments    Pt supine in bed on arrival, she reports she has vomited this am and does not feel well.  She was agreeable to session and denies physical impairments from her CVA.  She is able to ambulate and negotiate stairs without difficulty.  Minor balance impairment noted but no true LOB or unsafe gt.  She does report her feet and ankles are swollen.  PTA educated on ankle pumps/circles and continued ambulation to improve blood flow.     Follow Up Recommendations  No PT follow up;Supervision for mobility/OOB     Equipment Recommendations  None recommended by PT    Recommendations for Other Services       Precautions / Restrictions Precautions Precautions: Fall Precaution Comments: reports fall within last 2 weeks off the bed Restrictions Weight Bearing Restrictions: No    Mobility  Bed Mobility Overal bed mobility: Modified Independent             General bed mobility comments: increased time exiting on R side  Transfers Overall transfer level: Needs assistance Equipment used: None Transfers: Sit to/from Stand Sit to Stand: Supervision         General transfer comment: No assistance to come to standing.  Ambulation/Gait Ambulation/Gait assistance: Supervision Gait Distance (Feet): 100 Feet Assistive device: None Gait Pattern/deviations: Step-through pattern;Decreased stride length;Wide base of support     General Gait Details: No LOB but slow and guarded,  reports ankles are swollen.   Stairs Stairs: Yes Stairs assistance: Supervision Stair Management: Two rails Number of Stairs: 4 General stair comments: No impairment, supervision for safety   Wheelchair Mobility    Modified Rankin (Stroke Patients Only) Modified Rankin (Stroke Patients Only) Pre-Morbid Rankin Score: No significant disability Modified Rankin: Moderate disability     Balance Overall balance assessment: Needs assistance Sitting-balance support: No upper extremity supported;Feet supported Sitting balance-Leahy Scale: Good     Standing balance support: No upper extremity supported;During functional activity Standing balance-Leahy Scale: Fair                              Cognition Arousal/Alertness: Awake/alert Behavior During Therapy: Flat affect Overall Cognitive Status: Impaired/Different from baseline Area of Impairment: Safety/judgement                         Safety/Judgement: Decreased awareness of safety            Exercises      General Comments        Pertinent Vitals/Pain Pain Assessment: Faces Faces Pain Scale: Hurts a little bit Pain Location: abdomen Pain Descriptors / Indicators: Discomfort Pain Intervention(s): Monitored during session;Repositioned    Home Living                      Prior Function            PT Goals (current goals can now be found in the care plan section) Acute Rehab PT Goals Patient Stated Goal:  to work on her speech Potential to Achieve Goals: Good Progress towards PT goals: Progressing toward goals    Frequency    Min 4X/week      PT Plan Current plan remains appropriate    Co-evaluation              AM-PAC PT "6 Clicks" Mobility   Outcome Measure  Help needed turning from your back to your side while in a flat bed without using bedrails?: None Help needed moving from lying on your back to sitting on the side of a flat bed without using bedrails?:  None Help needed moving to and from a bed to a chair (including a wheelchair)?: None Help needed standing up from a chair using your arms (e.g., wheelchair or bedside chair)?: None Help needed to walk in hospital room?: None Help needed climbing 3-5 steps with a railing? : None 6 Click Score: 24    End of Session Equipment Utilized During Treatment: Gait belt Activity Tolerance: Patient tolerated treatment well Patient left: in bed;with call bell/phone within reach;with bed alarm set Nurse Communication: Mobility status PT Visit Diagnosis: Other abnormalities of gait and mobility (R26.89);Other symptoms and signs involving the nervous system (R29.898)     Time: [id number] PT Time Calculation (min) (ACUTE ONLY): 13 min  Charges:  $Gait Training: 8-22 mins                     Erasmo Leventhal , PTA Acute Rehabilitation Services Pager [phone] Office [phone]     Sh[place]il Aldana Eli Hose 10/28/2019, 4:44 PM

## 2019-10-28 NOTE — Progress Notes (Signed)
Benefits check submitted for Eliquis 5 mg BiD.

## 2019-10-28 NOTE — Discharge Instructions (Addendum)
Hospital Discharge After a Stroke  °Being discharged from the hospital after a stroke can feel overwhelming. Many things may be different, and it is normal to feel scared or anxious. Some stroke survivors may be able to return to their homes, and others may need more specialized care on a temporary or permanent basis. °Your stroke care team will work with you to develop a discharge plan that is best for you. Ask questions if you do not understand something. Invite a friend or family member to participate in discharge planning. Understanding and following your discharge plan can help to prevent another stroke or other problems. °Understanding your medicines °After a stroke, your health care provider may prescribe one or more types of medicine. It is important to take medicines exactly as told by your health care provider. Serious harm, such as another stroke, can happen if you are unable to take your medicine exactly as prescribed. Make sure you understand: °· What medicine to take. °· Why you are taking the medicine. °· How and when to take it. °· If it can be taken with your other medicines and herbal supplements. °· Possible side effects. °· When to call your health care provider if you have any side effects. °· How you will get and pay for your medicines. Medical assistance programs may be able to help you pay for prescription medicines if you cannot afford them. °If you are taking an anticoagulant, be sure to take it exactly as told by your health care provider. This type of medicine can increase the risk of bleeding because it works to prevent blood from clotting. You may need to take certain precautions to prevent bleeding. You should contact your health care provider if you have: °· Bleeding or bruising. °· A fall or other injury to your head. °· Blood in your urine or stool (feces). °Planning for home safety ° °Take steps to prevent falls, such as installing grab bars or using a shower chair. Ask a friend or  family member to get needed things in place before you go home if possible. A therapist can come to your home to make recommendations for safety equipment. Ask your health care provider if you would benefit from this service or from home care. °Getting needed equipment °Ask your health care provider for a list of any medical equipment and supplies you will need at home. These may include items such as: °· Walkers. °· Canes. °· Wheelchairs. °· Hand-strengthening devices. °· Special eating utensils. °Medical equipment can be rented or purchased, depending on your insurance coverage. Check with your insurance company about what is covered. °Keeping follow-up visits °After a stroke, you will need to follow up regularly with a health care provider. You may also need rehabilitation, which can include physical therapy, occupational therapy, or speech-language therapy. °Keeping these appointments is very important to your recovery after a stroke. Be sure to bring your medicine list and discharge papers with you to your appointments. If you need help to keep track of your schedule, use a calendar or appointment reminder. °Preventing another stroke °Having a stroke puts you at risk for another stroke in the future. Ask your health care provider what actions you can take to lower the risk. These may include: °· Increasing how much you exercise. °· Making a healthy eating plan. °· Quitting smoking. °· Managing other health conditions, such as high blood pressure, high cholesterol, or diabetes. °· Limiting alcohol use. °Knowing the warning signs of a stroke ° °Make sure   you understand the signs of a stroke. Before you leave the hospital, you will receive information outlining the stroke warning signs. Share these with your friends and family members. "BE FAST" is an easy way to remember the main warning signs of a stroke:  B - Balance. Signs are dizziness, sudden trouble walking, or loss of balance.  E - Eyes. Signs are  trouble seeing or a sudden change in vision.  F - Face. Signs are sudden weakness or numbness of the face, or the face or eyelid drooping on one side.  A - Arms. Signs are weakness or numbness in an arm. This happens suddenly and usually on one side of the body.  S - Speech. Signs are sudden trouble speaking, slurred speech, or trouble understanding what people say.  T - Time. Time to call emergency services. Write down what time symptoms started. Other signs of stroke may include:  A sudden, severe headache with no known cause.  Nausea or vomiting.  Seizure. These symptoms may represent a serious problem that is an emergency. Do not wait to see if the symptoms will go away. Get medical help right away. Call your local emergency services (911 in the [place] Do not drive yourself to the hospital. Make note of the time that you had your first symptoms. Your emergency responders or emergency room staff will need to know this information. Summary  Being discharged from the hospital after a stroke can feel overwhelming. It is normal to feel scared or anxious.  Make sure you take medicines exactly as told by your health care provider.  Know the warning signs of a stroke, and get help right way if you have any of these symptoms. "BE FAST" is an easy way to remember the main warning signs of a stroke. This information is not intended to replace advice given to you by your health care provider. Make sure you discuss any questions you have with your health care provider. Document Revised: 04/28/2019 Document Reviewed: 11/08/2016 Elsevier Patient Education  [employer] on my medicine - ELIQUIS (apixaban)  This medication education was reviewed with me or my healthcare representative as part of my discharge preparation.    Why was Eliquis prescribed for you? Eliquis was prescribed for you to reduce the risk of forming blood clots that can cause a stroke if you have a  medical condition called atrial fibrillation (a type of irregular heartbeat) OR to reduce the risk of a blood clots forming after orthopedic surgery.  What do You need to know about Eliquis ? Take your Eliquis TWICE DAILY - one tablet in the morning and one tablet in the evening with or without food.  It would be best to take the doses about the same time each day.  If you have difficulty swallowing the tablet whole please discuss with your pharmacist how to take the medication safely.  Take Eliquis exactly as prescribed by your doctor and DO NOT stop taking Eliquis without talking to the doctor who prescribed the medication.  Stopping may increase your risk of developing a new clot or stroke.  Refill your prescription before you run out.  After discharge, you should have regular check-up appointments with your healthcare provider that is prescribing your Eliquis.  In the future your dose may need to be changed if your kidney function or weight changes by a significant amount or as you get older.  What do you do if you miss a dose? If you miss  a dose, take it as soon as you remember on the same day and resume taking twice daily.  Do not take more than one dose of ELIQUIS at the same time.  Important Safety Information A possible side effect of Eliquis is bleeding. You should call your healthcare provider right away if you experience any of the following: ? Bleeding from an injury or your nose that does not stop. ? Unusual colored urine (red or dark brown) or unusual colored stools (red or black). ? Unusual bruising for unknown reasons. ? A serious fall or if you hit your head (even if there is no bleeding).  Some medicines may interact with Eliquis and might increase your risk of bleeding or clotting while on Eliquis. To help avoid this, consult your healthcare provider or pharmacist prior to using any new prescription or non-prescription medications, including herbals, vitamins,  non-steroidal anti-inflammatory drugs (NSAIDs) and supplements.  This website has more information on Eliquis (apixaban): [email or web address].[email or web address].

## 2019-10-28 NOTE — Progress Notes (Signed)
Occupational Therapy Treatment Patient Details Name: Melissa Hurley MRN: [id number] DOB: [birth year] Today's Date: 10/28/2019    History of present illness 54 y.o. female recently diagnosed with cholangiocarcinoma with metastases, currently undergoing chemotherapy, presented to ED with slurred and incomprehensible speech.  Brain MRI 3/8: Acute and more subacute appearing infarctions involving the right much greater than left MCA territories. There is also minor involvement of the right cerebellum   OT comments  Pt had just vomited upon OT's arrival. Performed grooming at EOB and changed gown with set up to min assist. Reinforced energy conservation strategies. Pt returned to supine with cold cloth on her head. Pt tearful at times.   Follow Up Recommendations  No OT follow up(pt is declining HHOT)    Equipment Recommendations  None recommended by OT    Recommendations for Other Services      Precautions / Restrictions Precautions Precautions: Fall       Mobility Bed Mobility Overal bed mobility: Modified Independent                Transfers                      Balance     Sitting balance-Leahy Scale: Good                                     ADL either performed or assessed with clinical judgement   ADL Overall ADL's : Needs assistance/impaired     Grooming: Wash/dry hands;Wash/dry face;Oral care;Sitting;Set up           Upper Body Dressing : Minimal assistance;Sitting                           Vision       Perception     Praxis      Cognition Arousal/Alertness: Awake/alert Behavior During Therapy: Flat affect(tearful at times) Overall Cognitive Status: Impaired/Different from baseline Area of Impairment: Safety/judgement                         Safety/Judgement: Decreased awareness of safety     General Comments: pt did not initiate calling for help after vomiting        Exercises      Shoulder Instructions       General Comments      Pertinent Vitals/ Pain       Pain Assessment: Faces Faces Pain Scale: Hurts a little bit Pain Location: abdomen Pain Descriptors / Indicators: Discomfort Pain Intervention(s): Monitored during session  Home Living                                          Prior Functioning/Environment              Frequency  Min 3X/week        Progress Toward Goals  OT Goals(current goals can now be found in the care plan section)  Progress towards OT goals: Progressing toward goals  Acute Rehab OT Goals Patient Stated Goal: to work on her speech OT Goal Formulation: With patient/family Time For Goal Achievement: 11/09/19 Potential to Achieve Goals: Good  Plan Discharge plan remains appropriate    Co-evaluation  AM-PAC OT "6 Clicks" Daily Activity     Outcome Measure   Help from another person eating meals?: None Help from another person taking care of personal grooming?: A Little Help from another person toileting, which includes using toliet, bedpan, or urinal?: A Little Help from another person bathing (including washing, rinsing, drying)?: A Little Help from another person to put on and taking off regular upper body clothing?: A Little Help from another person to put on and taking off regular lower body clothing?: A Little 6 Click Score: 19    End of Session    OT Visit Diagnosis: Unsteadiness on feet (R26.81)   Activity Tolerance Patient limited by fatigue(vomited)   Patient Left in bed;with call bell/phone within reach;with bed alarm set   Nurse Communication          Time: [phone] OT Time Calculation (min): 18 min  Charges: OT General Charges $OT Visit: 1 Visit OT Treatments $Self Care/Home Management : 8-22 mins  Nestor Lewandowsky, OTR/L Acute Rehabilitation Services Pager: [phone] Office: [phone]   Melissa Hurley 10/28/2019, 10:19  AM

## 2019-10-28 NOTE — Progress Notes (Signed)
ANTICOAGULATION CONSULT NOTE - Follow-Up Consult  Pharmacy Consult for ASA >> Apixaban Indication: Secondary CVA prophylaxis  No Known Allergies  Patient Measurements: Height: 5\' 7"  (170.2 cm) Weight: 210 lb (95.3 kg) IBW/kg (Calculated) : 61.6  Vital Signs: Temp: 98.2 F (36.8 C) (03/11 0749) Temp Source: Oral (03/11 0749) BP: 140/76 (03/11 0749) Pulse Rate: 92 (03/11 0749)  Labs: Recent Labs    10/25/19 1535 10/26/19 0430  HGB 11.6* 10.0*  HCT 38.2 33.4*  PLT 518* 486*  LABPROT 16.6*  --   INR 1.4*  --   CREATININE 1.00  --     Estimated Creatinine Clearance: 78 mL/min (by C-G formula based on SCr of 1 mg/dL).   Medical History: Past Medical History:  Diagnosis Date  . Cancer ([place])    Liver  . Cholangiocarcinoma ([place])   . Metastasis ([place])   . Pericardial effusion   . Sinus tachycardia   . Uterine fibroid     Assessment: 56 YOF with known cancer admitted on 3/8 with a new acute CVA. The patient had been receiving ASA now with plans to transition to Apixaban per neurology.  The patient did receive a dose of ASA 325 mg for secondary CVA prophylaxis and Enox 40 for VTE prophylaxis already today. Discussed with MD and will plan to initiate Apixaban this evening.   The patient and husband were counseled on Apixaban - all questions were addressed. They wish to get the new prescriptions for discharge filled at the [facility].   Goal of Therapy:  Appropriate anticoagulation for indication and hepatic/renal function    Plan:  - D/c ASA and Lovenox - Start Apixaban 5 mg bid - Education done, AVS in chart - To get prescription filled at [facility] - Pharmacy will sign off consult and monitor peripherally - intended discharge planned for today  Thank you for allowing pharmacy to be a part of this patient's care.  Alycia Rossetti, PharmD, BCPS Clinical Pharmacist Clinical phone for 10/28/2019: [phone] 10/28/2019 9:56 AM   **Pharmacist phone  directory can now be found on [email or web address] ([email or web address]).  Listed under [place].    Lawson Radar 10/28/2019,9:52 AM

## 2019-10-28 NOTE — Plan of Care (Signed)
Adequate for discharge.

## 2019-10-28 NOTE — Discharge Summary (Signed)
Physician Discharge Summary  Melissa Hurley [id number] DOB: 1965-08-20 DOA: 10/25/2019  PCP: Patient, No Pcp Per  Admit date: 10/25/2019 Discharge date: 10/28/2019  Admitted From: Home Disposition: Home  Recommendations for Outpatient Follow-up:  1. Follow up with PCP in 1-2 weeks 2. Please obtain BMP/CBC in one week your next doctors visit.  3. Follow-up outpatient neurology in 3 weeks 4. Follow-up outpatient oncology in 2-4 weeks 5. Oral Keflex for UTI prescribed 6. Eliquis 5 mg twice daily   Discharge Condition: Stable CODE STATUS: Full Diet recommendation: Heart healthy  Brief/Interim Summary: 54 year old with metastatic cholangiocarcinoma managed by oncology at [facility] on chemotherapy presenting with slurred speech for 3 days.  CTh acute CVA versus mass.  MRI brain showed acute left MCA CVA.  Continue aspirin 325 mg daily, transition to Eliquis upon discharge.  Rest of the work-up as listed below.  Outpatient therapy arrangements has been made.  Most of her hypercoagulable work-up is negative.  Slurred speech secondary to acute left MCA CVA -Stroke protocol, continues to have dysarthria -MRI brain without contrast-confirmed CVA.  CT head-CVA -CTA head and neck-proximal M2 occlusion, distal MCA branches subocclusive multifocal irregularities of mid distal ICA, several pulmonary nodules -Transition from aspirin to Eliquis 5 mg twice daily per neurology. -Echocardiogram-EF 60 to 65% -LDL-55, A1c-5.7 -Frequent neuro checks -Atorvastatin PO within 24 hrs.  -Risk factor modification Appreciate input from neurology, outpatient follow-up arrangements as listed below Home health arrangements made  Acute cystitis without hematuria -On Rocephin, transition to Keflex upon discharge.  No micro data available upon discharge therefore we will continue empiric treatment  Mild lactic acidosis -Resolved  Metastatic cholangiocarcinoma -On chemotherapy.  Follows at [facility]  Anemia of chronic disease Thrombocytosis -Secondary to malignancy and chemotherapy  Prolonged QTC-avoid QTC prolonging drugs  Discharge Diagnoses:  Principal Problem:   Acute CVA (cerebrovascular accident) ([place]) Active Problems:   Adenocarcinoma of unknown primary ([place])   Lactic acidosis   Metabolic acidosis   Anemia    Consultations:  Neurology  Subjective: Still having some dysarthria but improved compared to the time of admission.  Really wants to go home today.  Having some nausea but tells me this has been chronic in nature especially since her malignancy and being on chemotherapy  Discharge Exam: Vitals:   10/28/19 0444 10/28/19 0749  BP: (!) 199/165 140/76  Pulse: 93 92  Resp: 20 14  Temp: 97.7 F (36.5 C) 98.2 F (36.8 C)  SpO2: 100% 99%   Vitals:   10/27/19 1951 10/27/19 2334 10/28/19 0444 10/28/19 0749  BP: (!) 137/111 (!) 120/101 (!) 199/165 140/76  Pulse: 94 87 93 92  Resp: (!) 21 20 20 14   Temp: 98.8 F (37.1 C) 98.4 F (36.9 C) 97.7 F (36.5 C) 98.2 F (36.8 C)  TempSrc: Oral Oral Oral Oral  SpO2: 100% 100% 100% 99%  Weight:      Height:        General: Pt is alert, awake, not in acute distress Cardiovascular: RRR, S1/S2 +, no rubs, no gallops Respiratory: CTA bilaterally, no wheezing, no rhonchi Abdominal: Soft, NT, ND, bowel sounds + Extremities: no edema, no cyanosis Continues to have dysarthric speech otherwise no other focal neurologic deficits.  Discharge Instructions   Allergies as of 10/28/2019   No Known Allergies     Medication List    TAKE these medications   apixaban 5 MG Tabs tablet Commonly known as: ELIQUIS Take 1 tablet (5 mg total) by mouth 2 (  two) times daily.   calcium citrate-vitamin D 315-200 MG-UNIT tablet Commonly known as: CITRACAL+D Take 1 tablet by mouth in the morning and at bedtime.   cephALEXin 500 MG capsule Commonly known as: KEFLEX Take 1 capsule (500 mg total) by mouth 4 (four)  times daily for 4 days.   dexamethasone 4 MG tablet Commonly known as: DECADRON Take 8 mg by mouth See admin instructions. Take 8 mg (2 tablets) by mouth with breakfast on days 2 and 3 of each treatment cycle   Diflucan 100 MG tablet Generic drug: fluconazole Take 100 mg by mouth daily.   LORazepam 1 MG tablet Commonly known as: ATIVAN Take 1 mg by mouth every 8 (eight) hours as needed for anxiety.   megestrol 40 MG/ML suspension Commonly known as: MEGACE Take 400 mg by mouth 2 (two) times daily.   metoprolol tartrate 25 MG tablet Commonly known as: LOPRESSOR Take 1 tablet (25 mg total) by mouth 2 (two) times daily.   nystatin 100000 UNIT/ML suspension Commonly known as: MYCOSTATIN Take 5 mLs by mouth every 4 (four) hours as needed (as directed).   ondansetron 8 MG tablet Commonly known as: ZOFRAN Take 8 mg by mouth every 8 (eight) hours as needed for nausea.   pantoprazole 40 MG tablet Commonly known as: PROTONIX Take 40 mg by mouth daily before breakfast.   PARoxetine 10 MG tablet Commonly known as: PAXIL Take 10 mg by mouth daily.   prochlorperazine 10 MG tablet Commonly known as: COMPAZINE Take 10 mg by mouth every 8 (eight) hours as needed for nausea or vomiting.            Durable Medical Equipment  (From admission, onward)         Start     Ordered   10/27/19 1213  For home use only DME Walker rolling  Once    Question Answer Comment  Walker: With [place]   Patient needs a walker to treat with the following condition Stroke [facility])      10/27/19 1212         Follow-up Information    Camnitz, Ocie Doyne, MD. Schedule an appointment as soon as possible for a visit in 1 week(s).   Specialty: Cardiology Contact information: [address] [place] [address] [phone]        Rosalin Hawking, MD. Schedule an appointment as soon as possible for a visit in 3 week(s).   Specialty: Neurology Contact information: [place] [address] [phone]          No Known Allergies  You were cared for by a hospitalist during your hospital stay. If you have any questions about your discharge medications or the care you received while you were in the hospital after you are discharged, you can call the unit and asked to speak with the hospitalist on call if the hospitalist that took care of you is not available. Once you are discharged, your primary care physician will handle any further medical issues. Please note that no refills for any discharge medications will be authorized once you are discharged, as it is imperative that you return to your primary care physician (or establish a relationship with a primary care physician if you do not have one) for your aftercare needs so that they can reassess your need for medications and monitor your lab values.   Procedures/Studies: CT ANGIO HEAD W OR WO CONTRAST  Result Date: 10/26/2019  CLINICAL DATA:  Follow-up examination for acute stroke. EXAM: CT ANGIOGRAPHY HEAD AND NECK TECHNIQUE: Multidetector CT imaging of the head and neck was performed using the standard protocol during bolus administration of intravenous contrast. Multiplanar CT image reconstructions and MIPs were obtained to evaluate the vascular anatomy. Carotid stenosis measurements (when applicable) are obtained utilizing NASCET criteria, using the distal internal carotid diameter as the denominator. CONTRAST:  32mL OMNIPAQUE IOHEXOL 350 MG/ML SOLN COMPARISON:  Prior MRI from earlier the same day. FINDINGS: CTA NECK FINDINGS Aortic arch: Visualized aortic arch of normal caliber with normal 3 vessel morphology. Mild atheromatous change within the arch itself. No hemodynamically significant stenosis seen about the origin of the great vessels. Visualized subclavian arteries widely patent. Right carotid system: Right common carotid artery widely patent from its origin to the bifurcation without  stenosis. No significant atheromatous narrowing about the right bifurcation. Multifocal irregularity about the mid-distal right ICA suggestive of FMD. Right ICA patent to the skull base without flow-limiting stenosis, dissection, or occlusion. Left carotid system: Left common carotid artery widely patent from its origin to the bifurcation without stenosis. No significant atheromatous narrowing about the left bifurcation. Multifocal irregularity about the mid-distal left ICA suggestive of FMD. Left ICA patent to the skull base without stenosis, dissection or occlusion. Vertebral arteries: Both vertebral arteries arise from the subclavian arteries. Left vertebral artery dominant. Suspected mild changes of FMD involving the vertebral arteries bilaterally. Vertebral arteries patent to the skull base without stenosis, dissection, or occlusion. Skeleton: No acute osseous abnormality. No discrete lytic or blastic osseous lesions. Other neck: No other acute soft tissue abnormality within the neck. No mass lesion or adenopathy. Upper chest: Right-sided Port-A-Cath in place. 6 mm nodule present at the posterior right upper lobe (series 3, image 51). Additional 5 mm right upper lobe nodule noted (series 3, image 10) additional 4 mm right upper lobe nodule (series 3, image 13). Visualized lungs are otherwise clear. Review of the MIP images confirms the above findings CTA HEAD FINDINGS Anterior circulation: Petrous segments widely patent. Mild atheromatous irregularity throughout the carotid siphons without flow-limiting stenosis. ICA termini well perfused. A1 segments patent bilaterally. Normal anterior communicating artery. Anterior cerebral arteries widely patent to their distal aspects. No M1 stenosis or occlusion. Normal MCA bifurcations. Distal left MCA branches well perfused. On the right, there is occlusion of a proximal right M2 branch, inferior division (series 10, image 472). Opacification of right MCA branches  distally could be related to subocclusive and/or recanalized thrombus versus collateralization. Posterior circulation: Vertebral arteries patent to the vertebrobasilar junction without stenosis. Both picas patent. Basilar patent to its distal aspect without flow-limiting stenosis. Superior cerebral arteries patent bilaterally. Both PCAs primarily supplied via the basilar well perfused to their distal aspects. Prominent left posterior communicating artery noted. Venous sinuses: Grossly patent allowing for timing the contrast bolus. Anatomic variants: None significant.  No intracranial aneurysm. Review of the MIP images confirms the above findings IMPRESSION: 1. Positive CTA with proximal right M2 occlusion, inferior division. Opacification of distal right MCA branches could be related to subocclusive and/or recanalized thrombus versus collateral flow. Finding consistent with previously identified right MCA territory infarct. 2. Mild atherosclerotic change involving the carotid siphons without stenosis. No other hemodynamically significant or correctable stenosis about the major arterial vasculature of the head and neck. 3. Multifocal irregularity about the mid-distal ICAs bilaterally, suggesting FMD. Suspected mild involvement of both vertebral arteries as well. 4. Several right upper lobe pulmonary nodules measuring up to 6 mm  as above, indeterminate. Non-contrast chest CT at 3-6 months is recommended. If the nodules are stable at time of repeat CT, then future CT at 18-24 months (from today's scan) is considered optional for low-risk patients, but is recommended for high-risk patients. This recommendation follows the consensus statement: Guidelines for Management of Incidental Pulmonary Nodules Detected on CT Images: From the Fleischner Society 2017; Radiology 2017; 284:228-243. Electronically Signed   By: Jeannine Boga M.D.   On: 10/26/2019 00:00   CT Head Wo Contrast  Result Date: 10/25/2019 CLINICAL  DATA:  Speech difficulty, aphasia history of liver cancer EXAM: CT HEAD WITHOUT CONTRAST TECHNIQUE: Contiguous axial images were obtained from the base of the skull through the vertex without intravenous contrast. COMPARISON:  None. FINDINGS: Brain: Hypodensity/edema within the right temporal lobe and insula. Negative for hemorrhage. No midline shift. The ventricles are nonenlarged. Vascular: No hyperdense vessels. Scattered calcifications at the carotid siphons. Vertebral artery calcification. Skull: Normal. Negative for fracture or focal lesion. Sinuses/Orbits: No acute finding. Other: None IMPRESSION: 1. Hypodensity/edema within the right temporal lobe and posterior insula, favor acute to subacute infarct over mass/metastatic disease, though MRI would be more definitive. 2. Negative for hemorrhage, midline shift or significant mass effect. Electronically Signed   By: Donavan Foil M.D.   On: 10/25/2019 16:18   CT ANGIO NECK W OR WO CONTRAST  Result Date: 10/26/2019 CLINICAL DATA:  Follow-up examination for acute stroke. EXAM: CT ANGIOGRAPHY HEAD AND NECK TECHNIQUE: Multidetector CT imaging of the head and neck was performed using the standard protocol during bolus administration of intravenous contrast. Multiplanar CT image reconstructions and MIPs were obtained to evaluate the vascular anatomy. Carotid stenosis measurements (when applicable) are obtained utilizing NASCET criteria, using the distal internal carotid diameter as the denominator. CONTRAST:  7mL OMNIPAQUE IOHEXOL 350 MG/ML SOLN COMPARISON:  Prior MRI from earlier the same day. FINDINGS: CTA NECK FINDINGS Aortic arch: Visualized aortic arch of normal caliber with normal 3 vessel morphology. Mild atheromatous change within the arch itself. No hemodynamically significant stenosis seen about the origin of the great vessels. Visualized subclavian arteries widely patent. Right carotid system: Right common carotid artery widely patent from its origin to  the bifurcation without stenosis. No significant atheromatous narrowing about the right bifurcation. Multifocal irregularity about the mid-distal right ICA suggestive of FMD. Right ICA patent to the skull base without flow-limiting stenosis, dissection, or occlusion. Left carotid system: Left common carotid artery widely patent from its origin to the bifurcation without stenosis. No significant atheromatous narrowing about the left bifurcation. Multifocal irregularity about the mid-distal left ICA suggestive of FMD. Left ICA patent to the skull base without stenosis, dissection or occlusion. Vertebral arteries: Both vertebral arteries arise from the subclavian arteries. Left vertebral artery dominant. Suspected mild changes of FMD involving the vertebral arteries bilaterally. Vertebral arteries patent to the skull base without stenosis, dissection, or occlusion. Skeleton: No acute osseous abnormality. No discrete lytic or blastic osseous lesions. Other neck: No other acute soft tissue abnormality within the neck. No mass lesion or adenopathy. Upper chest: Right-sided Port-A-Cath in place. 6 mm nodule present at the posterior right upper lobe (series 3, image 51). Additional 5 mm right upper lobe nodule noted (series 3, image 10) additional 4 mm right upper lobe nodule (series 3, image 13). Visualized lungs are otherwise clear. Review of the MIP images confirms the above findings CTA HEAD FINDINGS Anterior circulation: Petrous segments widely patent. Mild atheromatous irregularity throughout the carotid siphons without flow-limiting stenosis. ICA termini  well perfused. A1 segments patent bilaterally. Normal anterior communicating artery. Anterior cerebral arteries widely patent to their distal aspects. No M1 stenosis or occlusion. Normal MCA bifurcations. Distal left MCA branches well perfused. On the right, there is occlusion of a proximal right M2 branch, inferior division (series 10, image 472). Opacification of  right MCA branches distally could be related to subocclusive and/or recanalized thrombus versus collateralization. Posterior circulation: Vertebral arteries patent to the vertebrobasilar junction without stenosis. Both picas patent. Basilar patent to its distal aspect without flow-limiting stenosis. Superior cerebral arteries patent bilaterally. Both PCAs primarily supplied via the basilar well perfused to their distal aspects. Prominent left posterior communicating artery noted. Venous sinuses: Grossly patent allowing for timing the contrast bolus. Anatomic variants: None significant.  No intracranial aneurysm. Review of the MIP images confirms the above findings IMPRESSION: 1. Positive CTA with proximal right M2 occlusion, inferior division. Opacification of distal right MCA branches could be related to subocclusive and/or recanalized thrombus versus collateral flow. Finding consistent with previously identified right MCA territory infarct. 2. Mild atherosclerotic change involving the carotid siphons without stenosis. No other hemodynamically significant or correctable stenosis about the major arterial vasculature of the head and neck. 3. Multifocal irregularity about the mid-distal ICAs bilaterally, suggesting FMD. Suspected mild involvement of both vertebral arteries as well. 4. Several right upper lobe pulmonary nodules measuring up to 6 mm as above, indeterminate. Non-contrast chest CT at 3-6 months is recommended. If the nodules are stable at time of repeat CT, then future CT at 18-24 months (from today's scan) is considered optional for low-risk patients, but is recommended for high-risk patients. This recommendation follows the consensus statement: Guidelines for Management of Incidental Pulmonary Nodules Detected on CT Images: From the Fleischner Society 2017; Radiology 2017; 284:228-243. Electronically Signed   By: Jeannine Boga M.D.   On: 10/26/2019 00:00   MR BRAIN WO CONTRAST  Result Date:  10/25/2019 CLINICAL DATA:  Slurred speech, abnormal CT EXAM: MRI HEAD WITHOUT CONTRAST TECHNIQUE: Multiplanar, multiecho pulse sequences of the brain and surrounding structures were obtained without intravenous contrast. COMPARISON:  Correlation made with same day CT FINDINGS: Brain: There is cortical/subcortical reduced diffusion in the right MCA territory primarily involving the temporal lobe and posterior insula with some additional parietal greater than frontal involvement. Contralateral foci of reduced diffusion are also present in the left frontal lobe. A few small foci are present within the right cerebellar hemisphere. No evidence of hemorrhage. There is no intracranial mass or significant mass effect. There is no hydrocephalus or extra-axial fluid collection. Ventricles and sulci are normal in size and configuration. Vascular: Major vessel flow voids at the skull base are preserved. Skull and upper cervical spine: Normal marrow signal is preserved. Sinuses/Orbits: Trace mucosal thickening.  Orbits are unremarkable. Other: Sella is unremarkable.  Mastoid air cells are clear. IMPRESSION: Acute and more subacute appearing infarctions involving the right much greater than left MCA territories. There is also minor involvement of the right cerebellum. No evidence of hemorrhage. Electronically Signed   By: Macy Mis M.D.   On: 10/25/2019 19:46   DG Chest Port 1 View  Result Date: 10/25/2019 CLINICAL DATA:  Tachycardia EXAM: PORTABLE CHEST 1 VIEW COMPARISON:  07/27/2019 FINDINGS: Right chest wall port catheter tip overlies the SVC. Persistent elevation of the right hemidiaphragm. No new consolidation or edema. No pleural effusion or pneumothorax. Cardiomediastinal contours are stable. IMPRESSION: No acute process in the chest. Electronically Signed   By: Addison Lank.D.  On: 10/25/2019 15:25   ECHOCARDIOGRAM COMPLETE  Result Date: 10/26/2019    ECHOCARDIOGRAM REPORT   Patient Name:   Melissa Hurley  Date of Exam: 10/26/2019 Medical Rec #:  [id number]       Height:       67.0 in Accession #:    [id number]      Weight:       210.0 lb Date of Birth:  1965-10-05      BSA:          2.065 m Patient Age:    34 years        BP:           139/95 mmHg Patient Gender: F               HR:           91 bpm. Exam Location:  Inpatient Procedure: 2D Echo Indications:    stroke 434.91  History:        Patient has prior history of Echocardiogram examinations, most                 recent 09/22/2019. Cancer. pericardial effusion.  Sonographer:    Jannett Celestine RDCS (AE) Referring Phys: [id number] VASUNDHRA Shadeland  1. Left ventricular ejection fraction, by estimation, is 60 to 65%. The left ventricle has normal function. The left ventricle has no regional wall motion abnormalities. Left ventricular diastolic parameters were normal.  2. Right ventricular systolic function is normal. The right ventricular size is normal.  3. The mitral valve is normal in structure. No evidence of mitral valve regurgitation. No evidence of mitral stenosis.  4. The aortic valve is normal in structure. Aortic valve regurgitation is not visualized. No aortic stenosis is present.  5. The inferior vena cava is normal in size with greater than 50% respiratory variability, suggesting right atrial pressure of 3 mmHg. Comparison(s): Prior images reviewed side by side. Changes from prior study are noted. The pericardial effusion is smaller compared to 09/22/2019. FINDINGS  Left Ventricle: Left ventricular ejection fraction, by estimation, is 60 to 65%. The left ventricle has normal function. The left ventricle has no regional wall motion abnormalities. The left ventricular internal cavity size was normal in size. There is  no left ventricular hypertrophy. Left ventricular diastolic parameters were normal. Right Ventricle: The right ventricular size is normal. No increase in right ventricular wall thickness. Right ventricular systolic function is normal.  Left Atrium: Left atrial size was normal in size. Right Atrium: Right atrial size was normal in size. Pericardium: Trivial pericardial effusion is present. The pericardial effusion is circumferential. Mitral Valve: The mitral valve is normal in structure. Normal mobility of the mitral valve leaflets. No evidence of mitral valve regurgitation. No evidence of mitral valve stenosis. Tricuspid Valve: The tricuspid valve is normal in structure. Tricuspid valve regurgitation is not demonstrated. No evidence of tricuspid stenosis. Aortic Valve: The aortic valve is normal in structure. Aortic valve regurgitation is not visualized. No aortic stenosis is present. Pulmonic Valve: The pulmonic valve was normal in structure. Pulmonic valve regurgitation is not visualized. No evidence of pulmonic stenosis. Aorta: The aortic root is normal in size and structure. Venous: The inferior vena cava is normal in size with greater than 50% respiratory variability, suggesting right atrial pressure of 3 mmHg. IAS/Shunts: No atrial level shunt detected by color flow Doppler.  LEFT VENTRICLE PLAX 2D LVIDd:         3.80 cm LVIDs:  2.40 cm LV PW:         1.00 cm LV IVS:        0.80 cm LVOT diam:     1.80 cm LV SV:         47 LV SV Index:   23 LVOT Area:     2.54 cm  RIGHT VENTRICLE RV S prime:     13.40 cm/s TAPSE (M-mode): 1.7 cm LEFT ATRIUM           Index       RIGHT ATRIUM           Index LA diam:      3.00 cm 1.45 cm/m  RA Area:     11.10 cm LA Vol (A4C): 35.0 ml 16.95 ml/m RA Volume:   21.70 ml  10.51 ml/m  AORTIC VALVE LVOT Vmax:   94.60 cm/s LVOT Vmean:  74.800 cm/s LVOT VTI:    0.183 m  AORTA Ao Root diam: 3.00 cm MITRAL VALVE MV Area (PHT): 2.91 cm    SHUNTS MV Decel Time: 261 msec    Systemic VTI:  0.18 m MV E velocity: 69.40 cm/s  Systemic Diam: 1.80 cm Dani Gobble Croitoru MD Electronically signed by Sanda Klein MD Signature Date/Time: 10/26/2019/2:14:48 PM    Final    VAS [place] LOWER EXTREMITY VENOUS (DVT)  Result Date:  10/26/2019  Lower Venous DVTStudy Indications: Stroke.  Risk Factors: Cancer Mets. Limitations: Poor ultrasound/tissue interface. Comparison Study: No prior exam. Performing Technologist: Baldwin Crown ARDMS, RVT  Examination Guidelines: A complete evaluation includes B-mode imaging, spectral Doppler, color Doppler, and power Doppler as needed of all accessible portions of each vessel. Bilateral testing is considered an integral part of a complete examination. Limited examinations for reoccurring indications may be performed as noted. The reflux portion of the exam is performed with the patient in reverse Trendelenburg.  +---------+---------------+---------+-----------+----------+------------------+ RIGHT    CompressibilityPhasicitySpontaneityPropertiesThrombus Aging     +---------+---------------+---------+-----------+----------+------------------+ CFV      Full           Yes      Yes                                     +---------+---------------+---------+-----------+----------+------------------+ SFJ      Full                                                            +---------+---------------+---------+-----------+----------+------------------+ FV Prox  Full                                                            +---------+---------------+---------+-----------+----------+------------------+ FV Mid   Full                                                            +---------+---------------+---------+-----------+----------+------------------+ FV DistalFull  Visualized with                                                          color              +---------+---------------+---------+-----------+----------+------------------+ PFV      Full                                                            +---------+---------------+---------+-----------+----------+------------------+ POP      Full           Yes       Yes                                     +---------+---------------+---------+-----------+----------+------------------+ PTV      Full                                         Visualized with                                                          color              +---------+---------------+---------+-----------+----------+------------------+ PERO     Full                                         Visualized with                                                          color              +---------+---------------+---------+-----------+----------+------------------+   +---------+---------------+---------+-----------+----------+------------------+ LEFT     CompressibilityPhasicitySpontaneityPropertiesThrombus Aging     +---------+---------------+---------+-----------+----------+------------------+ CFV      Full           Yes      Yes                                     +---------+---------------+---------+-----------+----------+------------------+ SFJ      Full                                                            +---------+---------------+---------+-----------+----------+------------------+ FV Prox  Full                                                            +---------+---------------+---------+-----------+----------+------------------+  FV Mid   Full                                                            +---------+---------------+---------+-----------+----------+------------------+ FV DistalFull                                         Visualized with                                                          color              +---------+---------------+---------+-----------+----------+------------------+ PFV      Full                                                            +---------+---------------+---------+-----------+----------+------------------+ POP      Full           Yes      Yes                                      +---------+---------------+---------+-----------+----------+------------------+ PTV      Full                                         Visualized with                                                          color              +---------+---------------+---------+-----------+----------+------------------+ PERO     Full                                         Visualized with                                                          color              +---------+---------------+---------+-----------+----------+------------------+    Summary: BILATERAL: - No evidence of deep vein thrombosis seen in the lower extremities, bilaterally.  RIGHT: - No cystic structure found in the popliteal fossa.  LEFT: - No cystic structure found in the popliteal fossa.  *See table(s) above for measurements and observations. Electronically  signed by Harold Barban MD on 10/26/2019 at 5:49:53 PM.    Final       The results of significant diagnostics from this hospitalization (including imaging, microbiology, ancillary and laboratory) are listed below for reference.     Microbiology: Recent Results (from the past 240 hour(s))  SARS CORONAVIRUS 2 (TAT 6-24 HRS) Nasopharyngeal Nasopharyngeal Swab     Status: None   Collection Time: 10/25/19  9:31 PM   Specimen: Nasopharyngeal Swab  Result Value Ref Range Status   SARS Coronavirus 2 NEGATIVE NEGATIVE Final    Comment: (NOTE) SARS-CoV-2 target nucleic acids are NOT DETECTED. The SARS-CoV-2 RNA is generally detectable in upper and lower respiratory specimens during the acute phase of infection. Negative results do not preclude SARS-CoV-2 infection, do not rule out co-infections with other pathogens, and should not be used as the sole basis for treatment or other patient management decisions. Negative results must be combined with clinical observations, patient history, and epidemiological information. The expected result is  Negative. Fact Sheet for Patients: SugarRoll.be Fact Sheet for Healthcare Providers: https://www.woods-mathews.com/ This test is not yet approved or cleared by the [place] FDA and  has been authorized for detection and/or diagnosis of SARS-CoV-2 by FDA under an Emergency Use Authorization (EUA). This EUA will remain  in effect (meaning this test can be used) for the duration of the COVID-19 declaration under Section 56 4(b)(1) of the Act, 21 U.S.C. section 360bbb-3(b)(1), unless the authorization is terminated or revoked sooner. Performed at [facility], [place] [address], [place], [place] [address]      Labs: BNP (last 3 results) No results for input(s): BNP in the last 8760 hours. Basic Metabolic Panel: Recent Labs  Lab 10/25/19 1535 10/26/19 0430  NA 141  --   K 4.2  --   CL 114*  --   CO2 18*  --   GLUCOSE 133*  --   BUN 10  --   CREATININE 1.00  --   CALCIUM 8.1*  --   MG  --  1.7   Liver Function Tests: Recent Labs  Lab 10/25/19 1535  AST 25  ALT 26  ALKPHOS 105  BILITOT 0.7  PROT 6.3*  ALBUMIN 2.6*   Recent Labs  Lab 10/25/19 1535  LIPASE 16   Recent Labs  Lab 10/25/19 1535  AMMONIA 13   CBC: Recent Labs  Lab 10/25/19 1535 10/26/19 0430  WBC 3.9* 4.2  NEUTROABS 1.1*  --   HGB 11.6* 10.0*  HCT 38.2 33.4*  MCV 80.8 80.3  PLT 518* 486*   Cardiac Enzymes: No results for input(s): CKTOTAL, CKMB, CKMBINDEX, TROPONINI in the last 168 hours. BNP: Invalid input(s): POCBNP CBG: No results for input(s): GLUCAP in the last 168 hours. D-Dimer No results for input(s): DDIMER in the last 72 hours. Hgb A1c Recent Labs    10/26/19 0430  HGBA1C 5.7*   Lipid Profile Recent Labs    10/26/19 0430  CHOL 108  HDL 14*  LDLCALC 55  TRIG 194*  CHOLHDL 7.7   Thyroid function studies No results for input(s): TSH, T4TOTAL, T3FREE, THYROIDAB in the last 72 hours.  Invalid input(s): FREET3 Anemia  work up No results for input(s): VITAMINB12, FOLATE, FERRITIN, TIBC, IRON, RETICCTPCT in the last 72 hours. Urinalysis    Component Value Date/Time   COLORURINE YELLOW 10/26/2019 0928   APPEARANCEUR HAZY (A) 10/26/2019 0928   LABSPEC <1.005 (L) 10/26/2019 0928   PHURINE 6.5 10/26/2019 0928   GLUCOSEU  NEGATIVE 10/26/2019 0928   HGBUR TRACE (A) 10/26/2019 0928   BILIRUBINUR NEGATIVE 10/26/2019 0928   KETONESUR NEGATIVE 10/26/2019 0928   PROTEINUR NEGATIVE 10/26/2019 0928   NITRITE POSITIVE (A) 10/26/2019 0928   LEUKOCYTESUR TRACE (A) 10/26/2019 0928   Sepsis Labs Invalid input(s): PROCALCITONIN,  WBC,  LACTICIDVEN Microbiology Recent Results (from the past 240 hour(s))  SARS CORONAVIRUS 2 (TAT 6-24 HRS) Nasopharyngeal Nasopharyngeal Swab     Status: None   Collection Time: 10/25/19  9:31 PM   Specimen: Nasopharyngeal Swab  Result Value Ref Range Status   SARS Coronavirus 2 NEGATIVE NEGATIVE Final    Comment: (NOTE) SARS-CoV-2 target nucleic acids are NOT DETECTED. The SARS-CoV-2 RNA is generally detectable in upper and lower respiratory specimens during the acute phase of infection. Negative results do not preclude SARS-CoV-2 infection, do not rule out co-infections with other pathogens, and should not be used as the sole basis for treatment or other patient management decisions. Negative results must be combined with clinical observations, patient history, and epidemiological information. The expected result is Negative. Fact Sheet for Patients: SugarRoll.be Fact Sheet for Healthcare Providers: https://www.woods-mathews.com/ This test is not yet approved or cleared by the [place] FDA and  has been authorized for detection and/or diagnosis of SARS-CoV-2 by FDA under an Emergency Use Authorization (EUA). This EUA will remain  in effect (meaning this test can be used) for the duration of the COVID-19 declaration under Section 56  4(b)(1) of the Act, 21 U.S.C. section 360bbb-3(b)(1), unless the authorization is terminated or revoked sooner. Performed at [facility], [place] [address] [place], [place] [address]      Time coordinating discharge:  I have spent 35 minutes face to face with the patient and on the ward discussing the patients care, assessment, plan and disposition with other care givers. >50% of the time was devoted counseling the patient about the risks and benefits of treatment/Discharge disposition and coordinating care.   SIGNED:   Damita Lack, MD  [facility] 10/28/2019, 10:33 AM   If 7PM-7AM, please contact night-coverage

## 2019-10-29 LAB — FACTOR 5 LEIDEN

## 2019-10-31 LAB — URINE CULTURE: Culture: 70000 — AB

## 2019-11-01 LAB — PROTHROMBIN GENE MUTATION

## 2019-11-25 ENCOUNTER — Telehealth: Payer: Self-pay

## 2019-11-25 NOTE — Telephone Encounter (Addendum)
Pt seen by Dr. Berline Lopes on 08-27-19. Endometrial bx done.  Results negative for for cancer. Pt currently being treat at [facility] for metastatic cholangiocarcinoma. She has stated that she has had vaginal bleeding 3-4 weeks ago and went throug 3 pads a day saturated with blood.  Currently she is soaking 1 pad a day. Dede Query PA-c was seeing if Dr. Berline Lopes could see her again to help manage the bleeding. Told Ms Melissa Hurley that Dr. Berline Lopes stated that a regular GYN could see her to manage this. Ms Melissa Hurley will call patient to see if she has a regular GYN. If not, Ms Melissa Hurley will call back to this office for a referral to be made to the [facility] for [facility]. [phone].

## 2019-11-26 NOTE — Telephone Encounter (Signed)
No return call as of 11-25-19 from Dede Query PA-C to make a GYN referral.

## 2019-11-29 ENCOUNTER — Telehealth: Payer: Self-pay | Admitting: *Deleted

## 2019-11-29 NOTE — Telephone Encounter (Signed)
Received call from Ashley Mariner, RN from [place] with Dr. Felizardo Hoffmann.  She is calling to see if this patient can receive IVF/electrolytes here even though her cancer treatments will continue at [facility].  Her current regime is Gem-Cis.. She frequently needs replacement IVF with Magnesium/Potassium. Her husband is her main caregiver but is continuing to try and work. Almyra Free is asking if her IV fluids etc could be done here.  Almyra Free states that they had tried to do this at home but did not have any luck with home health agencies they contacted.  Advised that we often use [facility] for fluids etc and provided her with Pam Chandler's contact # of [phone]. Advised that if this does not work that I would discuss this issue with Dr. Lorenso Courier. He had seen her in December but pt elected 2nd opinion and treatment @ [facility].  Provided my direct phone to julie in the event that she needs to contact me again.

## 2019-12-10 ENCOUNTER — Emergency Department (HOSPITAL_COMMUNITY): Payer: BC Managed Care – PPO

## 2019-12-10 ENCOUNTER — Other Ambulatory Visit: Payer: Self-pay

## 2019-12-10 ENCOUNTER — Encounter (HOSPITAL_COMMUNITY): Payer: Self-pay

## 2019-12-10 ENCOUNTER — Inpatient Hospital Stay (HOSPITAL_COMMUNITY)
Admission: EM | Admit: 2019-12-10 | Discharge: 2019-12-18 | DRG: 314 | Disposition: A | Discharge status: Home Health | Payer: BC Managed Care – PPO | Attending: Internal Medicine | Admitting: Internal Medicine

## 2019-12-10 DIAGNOSIS — Z20822 Contact with and (suspected) exposure to covid-19: Secondary | ICD-10-CM | POA: Diagnosis present

## 2019-12-10 DIAGNOSIS — Z803 Family history of malignant neoplasm of breast: Secondary | ICD-10-CM

## 2019-12-10 DIAGNOSIS — D61818 Other pancytopenia: Secondary | ICD-10-CM | POA: Diagnosis not present

## 2019-12-10 DIAGNOSIS — Z8673 Personal history of transient ischemic attack (TIA), and cerebral infarction without residual deficits: Secondary | ICD-10-CM

## 2019-12-10 DIAGNOSIS — D696 Thrombocytopenia, unspecified: Secondary | ICD-10-CM | POA: Diagnosis present

## 2019-12-10 DIAGNOSIS — R471 Dysarthria and anarthria: Secondary | ICD-10-CM | POA: Diagnosis not present

## 2019-12-10 DIAGNOSIS — I471 Supraventricular tachycardia: Secondary | ICD-10-CM | POA: Diagnosis not present

## 2019-12-10 DIAGNOSIS — K567 Ileus, unspecified: Secondary | ICD-10-CM | POA: Diagnosis not present

## 2019-12-10 DIAGNOSIS — R05 Cough: Secondary | ICD-10-CM

## 2019-12-10 DIAGNOSIS — Z79899 Other long term (current) drug therapy: Secondary | ICD-10-CM | POA: Diagnosis not present

## 2019-12-10 DIAGNOSIS — B9689 Other specified bacterial agents as the cause of diseases classified elsewhere: Secondary | ICD-10-CM | POA: Diagnosis not present

## 2019-12-10 DIAGNOSIS — R109 Unspecified abdominal pain: Secondary | ICD-10-CM

## 2019-12-10 DIAGNOSIS — C7951 Secondary malignant neoplasm of bone: Secondary | ICD-10-CM | POA: Diagnosis present

## 2019-12-10 DIAGNOSIS — E860 Dehydration: Secondary | ICD-10-CM | POA: Diagnosis present

## 2019-12-10 DIAGNOSIS — N179 Acute kidney failure, unspecified: Secondary | ICD-10-CM | POA: Diagnosis present

## 2019-12-10 DIAGNOSIS — R7881 Bacteremia: Principal | ICD-10-CM

## 2019-12-10 DIAGNOSIS — R6510 Systemic inflammatory response syndrome (SIRS) of non-infectious origin without acute organ dysfunction: Secondary | ICD-10-CM

## 2019-12-10 DIAGNOSIS — T80211A Bloodstream infection due to central venous catheter, initial encounter: Principal | ICD-10-CM | POA: Diagnosis present

## 2019-12-10 DIAGNOSIS — D692 Other nonthrombocytopenic purpura: Secondary | ICD-10-CM | POA: Diagnosis present

## 2019-12-10 DIAGNOSIS — C799 Secondary malignant neoplasm of unspecified site: Secondary | ICD-10-CM

## 2019-12-10 DIAGNOSIS — D61810 Antineoplastic chemotherapy induced pancytopenia: Secondary | ICD-10-CM | POA: Diagnosis present

## 2019-12-10 DIAGNOSIS — R112 Nausea with vomiting, unspecified: Secondary | ICD-10-CM | POA: Diagnosis present

## 2019-12-10 DIAGNOSIS — R609 Edema, unspecified: Secondary | ICD-10-CM | POA: Diagnosis not present

## 2019-12-10 DIAGNOSIS — E440 Moderate protein-calorie malnutrition: Secondary | ICD-10-CM | POA: Insufficient documentation

## 2019-12-10 DIAGNOSIS — T827XXA Infection and inflammatory reaction due to other cardiac and vascular devices, implants and grafts, initial encounter: Secondary | ICD-10-CM | POA: Diagnosis not present

## 2019-12-10 DIAGNOSIS — I700 Atherosclerosis of aorta: Secondary | ICD-10-CM | POA: Diagnosis present

## 2019-12-10 DIAGNOSIS — E876 Hypokalemia: Secondary | ICD-10-CM

## 2019-12-10 DIAGNOSIS — D701 Agranulocytosis secondary to cancer chemotherapy: Secondary | ICD-10-CM | POA: Diagnosis present

## 2019-12-10 DIAGNOSIS — A4189 Other specified sepsis: Secondary | ICD-10-CM | POA: Diagnosis present

## 2019-12-10 DIAGNOSIS — R5081 Fever presenting with conditions classified elsewhere: Secondary | ICD-10-CM | POA: Diagnosis present

## 2019-12-10 DIAGNOSIS — I472 Ventricular tachycardia: Secondary | ICD-10-CM | POA: Diagnosis not present

## 2019-12-10 DIAGNOSIS — R197 Diarrhea, unspecified: Secondary | ICD-10-CM

## 2019-12-10 DIAGNOSIS — Z6832 Body mass index (BMI) 32.0-32.9, adult: Secondary | ICD-10-CM

## 2019-12-10 DIAGNOSIS — D259 Leiomyoma of uterus, unspecified: Secondary | ICD-10-CM | POA: Diagnosis present

## 2019-12-10 DIAGNOSIS — I313 Pericardial effusion (noninflammatory): Secondary | ICD-10-CM | POA: Diagnosis present

## 2019-12-10 DIAGNOSIS — Z98891 History of uterine scar from previous surgery: Secondary | ICD-10-CM

## 2019-12-10 DIAGNOSIS — C221 Intrahepatic bile duct carcinoma: Secondary | ICD-10-CM | POA: Diagnosis present

## 2019-12-10 DIAGNOSIS — Z95828 Presence of other vascular implants and grafts: Secondary | ICD-10-CM | POA: Diagnosis not present

## 2019-12-10 DIAGNOSIS — N182 Chronic kidney disease, stage 2 (mild): Secondary | ICD-10-CM | POA: Diagnosis present

## 2019-12-10 DIAGNOSIS — Y712 Prosthetic and other implants, materials and accessory cardiovascular devices associated with adverse incidents: Secondary | ICD-10-CM | POA: Diagnosis present

## 2019-12-10 DIAGNOSIS — R627 Adult failure to thrive: Secondary | ICD-10-CM | POA: Diagnosis present

## 2019-12-10 DIAGNOSIS — I3139 Other pericardial effusion (noninflammatory): Secondary | ICD-10-CM

## 2019-12-10 DIAGNOSIS — R059 Cough, unspecified: Secondary | ICD-10-CM

## 2019-12-10 DIAGNOSIS — D649 Anemia, unspecified: Secondary | ICD-10-CM

## 2019-12-10 DIAGNOSIS — Z8041 Family history of malignant neoplasm of ovary: Secondary | ICD-10-CM

## 2019-12-10 DIAGNOSIS — Z801 Family history of malignant neoplasm of trachea, bronchus and lung: Secondary | ICD-10-CM

## 2019-12-10 DIAGNOSIS — T451X5A Adverse effect of antineoplastic and immunosuppressive drugs, initial encounter: Secondary | ICD-10-CM | POA: Diagnosis present

## 2019-12-10 DIAGNOSIS — Z7901 Long term (current) use of anticoagulants: Secondary | ICD-10-CM

## 2019-12-10 DIAGNOSIS — R42 Dizziness and giddiness: Secondary | ICD-10-CM | POA: Diagnosis not present

## 2019-12-10 DIAGNOSIS — T80212A Local infection due to central venous catheter, initial encounter: Secondary | ICD-10-CM | POA: Diagnosis not present

## 2019-12-10 LAB — CBC WITH DIFFERENTIAL/PLATELET
Abs Immature Granulocytes: 0.03 10*3/uL (ref 0.00–0.07)
Basophils Absolute: 0 10*3/uL (ref 0.0–0.1)
Basophils Relative: 1 %
Eosinophils Absolute: 0 10*3/uL (ref 0.0–0.5)
Eosinophils Relative: 1 %
HCT: 20 % — ABNORMAL LOW (ref 36.0–46.0)
Hemoglobin: 6.4 g/dL — CL (ref 12.0–15.0)
Immature Granulocytes: 2 %
Lymphocytes Relative: 35 %
Lymphs Abs: 0.5 10*3/uL — ABNORMAL LOW (ref 0.7–4.0)
MCH: 26 pg (ref 26.0–34.0)
MCHC: 32 g/dL (ref 30.0–36.0)
MCV: 81.3 fL (ref 80.0–100.0)
Monocytes Absolute: 0.2 10*3/uL (ref 0.1–1.0)
Monocytes Relative: 12 %
Neutro Abs: 0.7 10*3/uL — ABNORMAL LOW (ref 1.7–7.7)
Neutrophils Relative %: 49 %
Platelets: <5 10*3/uL — CL (ref 150–400)
RBC: 2.46 MIL/uL — ABNORMAL LOW (ref 3.87–5.11)
RDW: 18.8 % — ABNORMAL HIGH (ref 11.5–15.5)
WBC: 1.4 10*3/uL — CL (ref 4.0–10.5)
nRBC: 0 % (ref 0.0–0.2)

## 2019-12-10 LAB — CBC
HCT: 20.6 % — ABNORMAL LOW (ref 36.0–46.0)
Hemoglobin: 6.5 g/dL — CL (ref 12.0–15.0)
MCH: 26.4 pg (ref 26.0–34.0)
MCHC: 31.6 g/dL (ref 30.0–36.0)
MCV: 83.7 fL (ref 80.0–100.0)
Platelets: <5 10*3/uL — CL (ref 150–400)
RBC: 2.46 MIL/uL — ABNORMAL LOW (ref 3.87–5.11)
RDW: 18.9 % — ABNORMAL HIGH (ref 11.5–15.5)
WBC: 1.4 10*3/uL — CL (ref 4.0–10.5)
nRBC: 0 % (ref 0.0–0.2)

## 2019-12-10 LAB — COMPREHENSIVE METABOLIC PANEL
ALT: 16 U/L (ref 0–44)
AST: 22 U/L (ref 15–41)
Albumin: 2.4 g/dL — ABNORMAL LOW (ref 3.5–5.0)
Alkaline Phosphatase: 88 U/L (ref 38–126)
Anion gap: 14 (ref 5–15)
BUN: 21 mg/dL — ABNORMAL HIGH (ref 6–20)
CO2: 19 mmol/L — ABNORMAL LOW (ref 22–32)
Calcium: 6 mg/dL — CL (ref 8.9–10.3)
Chloride: 104 mmol/L (ref 98–111)
Creatinine, Ser: 1.49 mg/dL — ABNORMAL HIGH (ref 0.44–1.00)
GFR calc Af Amer: 46 mL/min — ABNORMAL LOW (ref >60)
GFR calc non Af Amer: 40 mL/min — ABNORMAL LOW (ref >60)
Glucose, Bld: 120 mg/dL — ABNORMAL HIGH (ref 70–99)
Potassium: 3.1 mmol/L — ABNORMAL LOW (ref 3.5–5.1)
Sodium: 137 mmol/L (ref 135–145)
Total Bilirubin: 0.7 mg/dL (ref 0.3–1.2)
Total Protein: 5.5 g/dL — ABNORMAL LOW (ref 6.5–8.1)

## 2019-12-10 LAB — POCT I-STAT EG7
Acid-base deficit: 2 mmol/L (ref 0.0–2.0)
Bicarbonate: 22.2 mmol/L (ref 20.0–28.0)
Calcium, Ion: 0.85 mmol/L — CL (ref 1.15–1.40)
HCT: 17 % — ABNORMAL LOW (ref 36.0–46.0)
Hemoglobin: 5.8 g/dL — CL (ref 12.0–15.0)
O2 Saturation: 59 %
Potassium: 2.8 mmol/L — ABNORMAL LOW (ref 3.5–5.1)
Sodium: 138 mmol/L (ref 135–145)
TCO2: 23 mmol/L (ref 22–32)
pCO2, Ven: 31.7 mmHg — ABNORMAL LOW (ref 44.0–60.0)
pH, Ven: 7.452 — ABNORMAL HIGH (ref 7.250–7.430)
pO2, Ven: 29 mmHg — CL (ref 32.0–45.0)

## 2019-12-10 LAB — PHOSPHORUS: Phosphorus: <1 mg/dL — CL (ref 2.5–4.6)

## 2019-12-10 LAB — RESPIRATORY PANEL BY RT PCR (FLU A&B, COVID)
Influenza A by PCR: NEGATIVE
Influenza B by PCR: NEGATIVE
SARS Coronavirus 2 by RT PCR: NEGATIVE

## 2019-12-10 LAB — ABO/RH: ABO/RH(D): O POS

## 2019-12-10 LAB — I-STAT BETA HCG BLOOD, ED (MC, WL, AP ONLY): I-stat hCG, quantitative: <5 m[IU]/mL (ref <5)

## 2019-12-10 LAB — LACTIC ACID, PLASMA
Lactic Acid, Venous: 2.9 mmol/L (ref 0.5–1.9)
Lactic Acid, Venous: 2.2 mmol/L (ref 0.5–1.9)

## 2019-12-10 LAB — LIPASE, BLOOD: Lipase: 19 U/L (ref 11–51)

## 2019-12-10 LAB — PREPARE RBC (CROSSMATCH)

## 2019-12-10 LAB — MAGNESIUM: Magnesium: 0.9 mg/dL — CL (ref 1.7–2.4)

## 2019-12-10 MED ORDER — SODIUM CHLORIDE 0.9 % IV SOLN: INTRAVENOUS | Status: DC

## 2019-12-10 MED ORDER — PAROXETINE HCL 10 MG PO TABS
10.0000 mg | ORAL_TABLET | Freq: Every day | ORAL | Status: DC
  Filled 2019-12-10 (×3): qty 1

## 2019-12-10 MED ORDER — SODIUM CHLORIDE 0.9% FLUSH: 3.0000 mL | Freq: Once | INTRAVENOUS | Status: DC

## 2019-12-10 MED ORDER — MEGESTROL ACETATE 400 MG/10ML PO SUSP
400.0000 mg | Freq: Two times a day (BID) | ORAL | Status: DC
  Administered 2019-12-11: 400 mg via ORAL
  Filled 2019-12-10 (×4): qty 10

## 2019-12-10 MED ORDER — ACETAMINOPHEN 650 MG RE SUPP
650.0000 mg | Freq: Four times a day (QID) | RECTAL | Status: DC | PRN
  Administered 2019-12-11: 650 mg via RECTAL
  Filled 2019-12-10: qty 1

## 2019-12-10 MED ORDER — LACTATED RINGERS IV BOLUS
1000.0000 mL | Freq: Once | INTRAVENOUS | Status: AC
  Administered 2019-12-10: 1000 mL via INTRAVENOUS

## 2019-12-10 MED ORDER — ACETAMINOPHEN 325 MG PO TABS
650.0000 mg | ORAL_TABLET | Freq: Four times a day (QID) | ORAL | Status: DC | PRN
  Administered 2019-12-11: 650 mg via ORAL
  Filled 2019-12-10 (×2): qty 2

## 2019-12-10 MED ORDER — POTASSIUM CHLORIDE CRYS ER 20 MEQ PO TBCR
40.0000 meq | EXTENDED_RELEASE_TABLET | ORAL | Status: DC
  Filled 2019-12-10: qty 2

## 2019-12-10 MED ORDER — LORAZEPAM 1 MG PO TABS: 1.0000 mg | ORAL_TABLET | Freq: Three times a day (TID) | ORAL | Status: DC | PRN

## 2019-12-10 MED ORDER — ONDANSETRON HCL 4 MG/2ML IJ SOLN
4.0000 mg | Freq: Four times a day (QID) | INTRAMUSCULAR | Status: DC | PRN
  Administered 2019-12-10 – 2019-12-18 (×4): 4 mg via INTRAVENOUS
  Filled 2019-12-10 (×4): qty 2

## 2019-12-10 MED ORDER — SODIUM CHLORIDE 0.9 % IV SOLN
1.0000 g | Freq: Once | INTRAVENOUS | Status: AC
  Administered 2019-12-10: 1 g via INTRAVENOUS
  Filled 2019-12-10: qty 10

## 2019-12-10 MED ORDER — SODIUM CHLORIDE 0.9% FLUSH
3.0000 mL | Freq: Two times a day (BID) | INTRAVENOUS | Status: DC
  Administered 2019-12-10 – 2019-12-16 (×8): 3 mL via INTRAVENOUS

## 2019-12-10 MED ORDER — POTASSIUM CHLORIDE 10 MEQ/100ML IV SOLN
10.0000 meq | INTRAVENOUS | Status: AC
  Administered 2019-12-10 (×4): 10 meq via INTRAVENOUS
  Filled 2019-12-10 (×4): qty 100

## 2019-12-10 MED ORDER — MAGNESIUM SULFATE 2 GM/50ML IV SOLN
2.0000 g | Freq: Once | INTRAVENOUS | Status: AC
  Administered 2019-12-10: 21:00:00 2 g via INTRAVENOUS
  Filled 2019-12-10: qty 50

## 2019-12-10 MED ORDER — K PHOS MONO-SOD PHOS DI & MONO 155-852-130 MG PO TABS
500.0000 mg | ORAL_TABLET | Freq: Two times a day (BID) | ORAL | Status: DC
  Administered 2019-12-11: 500 mg via ORAL
  Filled 2019-12-10 (×2): qty 2

## 2019-12-10 MED ORDER — SODIUM CHLORIDE 0.9% IV SOLUTION: Freq: Once | INTRAVENOUS | Status: AC

## 2019-12-10 NOTE — H&P (Signed)
History and Physical    Melissa Hurley [id number] DOB: 1966/04/19 DOA: 12/10/2019  PCP: Patient, No Pcp Per  Patient coming from: Home  Chief Complaint: N/V, poor PO intake   HPI: Melissa Hurley is a 54 y.o. female with medical history significant of metastatic intrahepatic cholangiocarcinoma, followed at [facility] and currently undergoing tx cisplatin/gemcitabine, who was recently hospitalized in 10/2019 due to acute left MCA CVA and started on eliquis presents with nausea, vomiting, poor p.o. intake.  Her husband is at bedside who helps with history.  They state that she has had poor intake for several months since she started chemotherapy.  She is not able to eat much, has had some nausea as well as vomiting.  Her last chemotherapy treatment was about 2 weeks ago.  Over the past couple of days, she has noted some epistaxis which has now resolved.  She has also noticed a large bruise on her right forearm.  She presents to the hospital at the recommendation of her oncologist to be evaluated for dehydration.  She denies any fevers or chills, productive cough or any abdominal pain.  She has intermittent diarrhea, which she has had after chemotherapy treatments.  Currently, she does not notice any bleeding.  Her last Eliquis dose was 4/23 a.m.  ED Course: Labs reveal WBC 1.4, hemoglobin 6.5, platelet <5, hypokalemia with potassium 3.1, hypocalcemia 6.0, AKI with creatinine 1.49.  Case was discussed by EDP with oncology on-call who recommended blood product transfusions and supportive care as well as electrolyte replacement.  Covid testing was ordered and pending at time of admission.  Review of Systems: As per HPI. Otherwise, all other review of systems reviewed and are negative.   Past Medical History:  Diagnosis Date  . Cancer ([place])    Liver  . Cholangiocarcinoma ([place])   . Metastasis ([place])   . Pericardial effusion   . Sinus tachycardia   . Uterine fibroid     Past  Surgical History:  Procedure Laterality Date  . CESAREAN SECTION       reports that she has never smoked. She has never used smokeless tobacco. She reports that she does not drink alcohol or use drugs.  No Known Allergies  Family History  Problem Relation Age of Onset  . Breast cancer Mother        54's  . Ovarian cancer Mother   . Lung cancer Mother        63s  . Ovarian cancer Maternal Aunt   . Breast cancer Maternal Grandmother   . Breast cancer Cousin        maternal, dx50+  . Colon cancer Neg Hx   . Esophageal cancer Neg Hx   . Rectal cancer Neg Hx   . Stomach cancer Neg Hx      Prior to Admission medications   Medication Sig Start Date End Date Taking? Authorizing Provider  apixaban (ELIQUIS) 5 MG TABS tablet Take 1 tablet (5 mg total) by mouth 2 (two) times daily. 10/28/19   Amin, Jeanella Flattery, MD  calcium citrate-vitamin D (CITRACAL+D) 315-200 MG-UNIT tablet Take 1 tablet by mouth in the morning and at bedtime. 10/12/19 11/11/19  [provider]  dexamethasone (DECADRON) 4 MG tablet Take 8 mg by mouth See admin instructions. Take 8 mg (2 tablets) by mouth with breakfast on days 2 and 3 of each treatment cycle 10/04/19   [provider]  fluconazole (DIFLUCAN) 100 MG tablet Take 100 mg by mouth daily.  10/14/19   [provider]  LORazepam (ATIVAN) 1 MG tablet Take 1 mg by mouth every 8 (eight) hours as needed for anxiety.  10/12/19   [provider]  megestrol (MEGACE) 40 MG/ML suspension Take 400 mg by mouth 2 (two) times daily.  09/20/19   [provider]  metoprolol tartrate (LOPRESSOR) 25 MG tablet Take 1 tablet (25 mg total) by mouth 2 (two) times daily. Patient not taking: Reported on 10/25/2019 09/22/19 12/21/19  Charlie Pitter, PA-C  nystatin (MYCOSTATIN) 100000 UNIT/ML suspension Take 5 mLs by mouth every 4 (four) hours as needed (as directed).  10/14/19   [provider]  ondansetron (ZOFRAN) 8 MG tablet Take 8 mg by mouth  every 8 (eight) hours as needed for nausea.  09/20/19   [provider]  pantoprazole (PROTONIX) 40 MG tablet Take 40 mg by mouth daily before breakfast.  10/13/19   [provider]  PARoxetine (PAXIL) 10 MG tablet Take 10 mg by mouth daily. 10/11/19   [provider]  prochlorperazine (COMPAZINE) 10 MG tablet Take 10 mg by mouth every 8 (eight) hours as needed for nausea or vomiting.    [provider]    Physical Exam: Vitals:   12/10/19 1430 12/10/19 1530 12/10/19 1600 12/10/19 1608  BP: (!) 90/44 112/69 130/85   Pulse: (!) 107  (!) 117 (!) 114  Resp: 20 (!) 26 (!) 29 (!) 23  Temp: 98.6 F (37 C)     TempSrc: Oral     SpO2: 99%  100% 100%  Weight: 93 kg     Height: 5\' 7"  (1.702 m)        Constitutional: NAD, comfortable Eyes: PERRL, lids and conjunctivae normal ENMT: Mucous membranes are moist. Normal dentition.  Respiratory: Clear to auscultation bilaterally, no wheezing, no crackles. Normal respiratory effort. No accessory muscle use. No conversational dyspnea  Cardiovascular: Tachycardic rate and regular rhythm, no murmurs. No extremity edema.  Abdomen: Soft, nondistended, nontender to palpation. Bowel sounds positive.  Musculoskeletal: No joint deformity upper and lower extremities. No contractures. Normal muscle tone.  Skin: + Large bruise right forearm, right lateral thigh, petechiae of bilateral lower extremities Neurologic: Alert and oriented, speech fluent, CN 2-12 grossly intact. No focal deficits.   Psychiatric: Normal judgment and insight. Normal mood and affect, anxious and tearful  Labs on Admission: I have personally reviewed following labs and imaging studies  CBC: Recent Labs  Lab 12/10/19 1530  WBC 1.4*  HGB 6.5*  HCT 20.6*  MCV 83.7  PLT <5*   Basic Metabolic Panel: Recent Labs  Lab 12/10/19 1422  NA 137  K 3.1*  CL 104  CO2 19*  GLUCOSE 120*  BUN 21*  CREATININE 1.49*  CALCIUM 6.0*   GFR: Estimated  Creatinine Clearance: 51.7 mL/min (A) (by C-G formula based on SCr of 1.49 mg/dL (H)). Liver Function Tests: Recent Labs  Lab 12/10/19 1422  AST 22  ALT 16  ALKPHOS 88  BILITOT 0.7  PROT 5.5*  ALBUMIN 2.4*   Recent Labs  Lab 12/10/19 1422  LIPASE 19   No results for input(s): AMMONIA in the last 168 hours. Coagulation Profile: No results for input(s): INR, PROTIME in the last 168 hours. Cardiac Enzymes: No results for input(s): CKTOTAL, CKMB, CKMBINDEX, TROPONINI in the last 168 hours. BNP (last 3 results) No results for input(s): PROBNP in the last 8760 hours. HbA1C: No results for input(s): HGBA1C in the last 72 hours. CBG: No results for input(s):  GLUCAP in the last 168 hours. Lipid Profile: No results for input(s): CHOL, HDL, LDLCALC, TRIG, CHOLHDL, LDLDIRECT in the last 72 hours. Thyroid Function Tests: No results for input(s): TSH, T4TOTAL, FREET4, T3FREE, THYROIDAB in the last 72 hours. Anemia Panel: No results for input(s): VITAMINB12, FOLATE, FERRITIN, TIBC, IRON, RETICCTPCT in the last 72 hours. Urine analysis:    Component Value Date/Time   COLORURINE YELLOW 10/26/2019 0928   APPEARANCEUR HAZY (A) 10/26/2019 0928   LABSPEC <1.005 (L) 10/26/2019 0928   PHURINE 6.5 10/26/2019 0928   GLUCOSEU NEGATIVE 10/26/2019 0928   HGBUR TRACE (A) 10/26/2019 0928   BILIRUBINUR NEGATIVE 10/26/2019 0928   KETONESUR NEGATIVE 10/26/2019 0928   PROTEINUR NEGATIVE 10/26/2019 0928   NITRITE POSITIVE (A) 10/26/2019 0928   LEUKOCYTESUR TRACE (A) 10/26/2019 0928   Sepsis Labs: !!!!!!!!!!!!!!!!!!!!!!!!!!!!!!!!!!!!!!!!!!!! @LABRCNTIP (procalcitonin:4,lacticidven:4) )No results found for this or any previous visit (from the past 240 hour(s)).   Radiological Exams on Admission: DG Chest Port 1 View  Result Date: 12/10/2019 CLINICAL DATA:  54 year old female with weakness. EXAM: PORTABLE CHEST 1 VIEW COMPARISON:  Chest radiograph dated 10/25/2019. FINDINGS: Right-sided Port-A-Cath  with tip at the cavoatrial junction. There is no focal consolidation, pleural effusion, pneumothorax. Mild cardiomegaly. Atherosclerotic calcification of the aortic arch. No acute osseous pathology. IMPRESSION: 1. No acute cardiopulmonary process. 2. Mild cardiomegaly. Electronically Signed   By: Anner Crete M.D.   On: 12/10/2019 16:21     Assessment/Plan Principal Problem:   Pancytopenia due to chemotherapy [facility]) Active Problems:   Nausea & vomiting   Dehydration   AKI (acute kidney injury) ([place])   SIRS (systemic inflammatory response syndrome) (HCC)   Metastatic cholangiocarcinoma to bone (HCC)   History of CVA (cerebrovascular accident)   Hypokalemia   Hypocalcemia   Pancytopenia due to chemotherapy -EDP discussed with heme/onc Dr. Irene Limbo who recommended blood product transfusion and supportive care. Pancytopenia likely caused by chemo tx  -Had epistaxis prior to admission, no current bleeding -Platelet and pRBC ordered in the ED  -Trend CBC   N/V, poor PO intake -Likely in setting of recent chemo tx -Supportive care -IVF   SIRS -Presented with tachycardia, tachypnea, leukopenia without fever, lactic acid 2.9  -Could be in setting of above and dehydration  -CXR without acute cardiopulmonary process  -Blood cultures pending -COVID pending  -Check UA   Metastatic cholangiocarcinoma -Followed by [facility]    Recent left MCA CVA 10/2019 -Started on eliquis at that time. Will hold due to pancytopenia   Hypokalemia -Replace, trend  -Mg and phos pending   AKI  -Baseline Cr 1 -IVF and trend BMP  -Avoid nephrotoxins   Hypocalcemia -Replaced, trend     DVT prophylaxis: SCD Code Status: Full code presumed Family Communication: Spouse at bedside Disposition Plan: Pending clinical improvement, suspect patient can return back to home setting  Consults called: Oncology called by EDP    Status is: Inpatient  Remains inpatient appropriate  because:Persistent severe electrolyte disturbances, IV treatments appropriate due to intensity of illness or inability to take PO and Inpatient level of care appropriate due to severity of illness   Dispo: The patient is from: Home              Anticipated d/c is to: Home              Anticipated d/c date is: 1-2 days              Patient currently is not medically stable to d/c.  Severity of Illness: The appropriate patient status for this patient is INPATIENT. Inpatient status is judged to be reasonable and necessary in order to provide the required intensity of service to ensure the patient's safety. The patient's presenting symptoms, physical exam findings, and initial radiographic and laboratory data in the context of their chronic comorbidities is felt to place them at high risk for further clinical deterioration. Furthermore, it is not anticipated that the patient will be medically stable for discharge from the hospital within 2 midnights of admission.   * I certify that at the point of admission it is my clinical judgment that the patient will require inpatient hospital care spanning beyond 2 midnights from the point of admission due to high intensity of service, high risk for further deterioration and high frequency of surveillance required.Dessa Phi, DO [facility] 12/10/2019, 5:27 PM   Available via Epic secure chat 7am-7pm After these hours, please refer to coverage provider listed on [email or web address]

## 2019-12-10 NOTE — ED Notes (Signed)
Pt c/o nausea approx 13 minutes in to platelet administration; infusion stopped; Charge RN, Dr. Maylene Roes notified; per Dr. Maylene Roes, stop infusion for now, administer zofran; restart platelet infusion when pt no longer nauseated; Blood bank also notified

## 2019-12-10 NOTE — ED Provider Notes (Signed)
[place] EMERGENCY DEPARTMENT Provider Note   CSN: [id number] Arrival date & time: 12/10/19  1330     History Chief Complaint  Patient presents with  . Nausea  . Weakness  . Emesis    Melissa Hurley is a 54 y.o. female.  54 yo F with a chief complaint of nausea vomiting and diarrhea.  Patient gets this frequently after having chemotherapy.  Patient got chemotherapy about 2 weeks ago.  Having a couple days of symptoms.  She denies abdominal pain denies fevers or chills denies cough congestion denies urinary complaint.  She called her oncologist today who suggested to come to the ED to be evaluated for dehydration versus infection.  States that she typically is dehydrated usually gets fluids and feels much better.  She also has been having some easy bruising and nosebleeds over the past 48 hours.  The history is provided by the patient.  Weakness Associated symptoms: nausea and vomiting   Associated symptoms: no arthralgias, no chest pain, no dizziness, no dysuria, no fever, no headaches, no myalgias, no shortness of breath and no urgency   Emesis Associated symptoms: no arthralgias, no chills, no fever, no headaches and no myalgias   Illness Severity:  Moderate Onset quality:  Gradual Duration:  2 days Timing:  Constant Progression:  Worsening Chronicity:  New Associated symptoms: nausea and vomiting   Associated symptoms: no chest pain, no congestion, no fever, no headaches, no myalgias, no rhinorrhea, no shortness of breath and no wheezing        Past Medical History:  Diagnosis Date  . Cancer ([place])    Liver  . Cholangiocarcinoma ([place])   . Metastasis ([place])   . Pericardial effusion   . Sinus tachycardia   . Uterine fibroid     Patient Active Problem List   Diagnosis Date Noted  . Pancytopenia due to chemotherapy ([place]) 12/10/2019  . Nausea & vomiting 12/10/2019  . Dehydration 12/10/2019  . AKI (acute kidney injury) ([place]) 12/10/2019  . SIRS  (systemic inflammatory response syndrome) ([place]) 12/10/2019  . Metastatic cholangiocarcinoma to bone ([place]) 12/10/2019  . History of CVA (cerebrovascular accident) 12/10/2019  . Hypokalemia 12/10/2019  . Hypocalcemia 12/10/2019  . Acute CVA (cerebrovascular accident) ([place]) 10/25/2019  . Anemia 10/25/2019  . Fibroid uterus 08/27/2019    Past Surgical History:  Procedure Laterality Date  . CESAREAN SECTION       OB History    Gravida  2   Para  1   Term      Preterm      AB  1   Living        SAB  1   TAB      Ectopic      Multiple      Live Births              Family History  Problem Relation Age of Onset  . Breast cancer Mother        23's  . Ovarian cancer Mother   . Lung cancer Mother        37s  . Ovarian cancer Maternal Aunt   . Breast cancer Maternal Grandmother   . Breast cancer Cousin        maternal, dx50+  . Colon cancer Neg Hx   . Esophageal cancer Neg Hx   . Rectal cancer Neg Hx   . Stomach cancer Neg Hx     Social History   Tobacco Use  .  Smoking status: Never Smoker  . Smokeless tobacco: Never Used  Substance Use Topics  . Alcohol use: Never  . Drug use: Never    Home Medications Prior to Admission medications   Medication Sig Start Date End Date Taking? Authorizing Provider  apixaban (ELIQUIS) 5 MG TABS tablet Take 1 tablet (5 mg total) by mouth 2 (two) times daily. 10/28/19  Yes Amin, Ankit Chirag, MD  dexamethasone (DECADRON) 4 MG tablet Take 8 mg by mouth See admin instructions. Take 2 tablets (8 mg)  by mouth with breakfast on days 2 and 3 of each treatment cycle 10/04/19  Yes [provider]  glycopyrrolate (ROBINUL) 1 MG tablet Take 1 mg by mouth 3 (three) times daily as needed (drooling).  12/06/19  Yes [provider]  lidocaine-prilocaine (EMLA) cream Apply small amount topically to port  1-2 hours prior to anticipated access 11/10/19  Yes [provider]  LORazepam (ATIVAN) 1 MG tablet Take 1 mg  by mouth every 8 (eight) hours as needed for anxiety.  10/12/19  Yes [provider]  megestrol (MEGACE) 40 MG/ML suspension Take 400 mg by mouth 2 (two) times daily as needed (appetite).  09/20/19  Yes [provider]  nystatin (MYCOSTATIN) 100000 UNIT/ML suspension Take 5 mLs by mouth every 4 (four) hours as needed (thrush).  10/14/19  Yes [provider]  ondansetron (ZOFRAN) 8 MG tablet Take 8 mg by mouth every 8 (eight) hours as needed for nausea.  09/20/19  Yes [provider]  prochlorperazine (COMPAZINE) 10 MG tablet Take 10 mg by mouth every 8 (eight) hours as needed for nausea or vomiting.   Yes [provider]  metoprolol tartrate (LOPRESSOR) 25 MG tablet Take 1 tablet (25 mg total) by mouth 2 (two) times daily. Patient not taking: Reported on 10/25/2019 09/22/19 12/21/19  Charlie Pitter, PA-C    Allergies    Patient has no known allergies.  Review of Systems   Review of Systems  Constitutional: Negative for chills and fever.  HENT: Negative for congestion and rhinorrhea.   Eyes: Negative for redness and visual disturbance.  Respiratory: Negative for shortness of breath and wheezing.   Cardiovascular: Negative for chest pain and palpitations.  Gastrointestinal: Positive for nausea and vomiting.  Genitourinary: Negative for dysuria and urgency.  Musculoskeletal: Negative for arthralgias and myalgias.  Skin: Negative for pallor and wound.  Neurological: Positive for weakness. Negative for dizziness and headaches.    Physical Exam Updated Vital Signs BP 129/75   Pulse (!) 113   Temp 99.9 F (37.7 C) (Oral)   Resp (!) 27   Ht 5\' 7"  (1.702 m)   Wt 93 kg   LMP 10/25/2014   SpO2 100%   BMI 32.11 kg/m   Physical Exam Vitals and nursing note reviewed.  Constitutional:      General: She is not in acute distress.    Appearance: She is well-developed. She is not diaphoretic.     Comments: Pale   HENT:     Head: Normocephalic and  atraumatic.  Eyes:     Pupils: Pupils are equal, round, and reactive to light.  Cardiovascular:     Rate and Rhythm: Normal rate and regular rhythm.     Heart sounds: No murmur. No friction rub. No gallop.   Pulmonary:     Effort: Pulmonary effort is normal.     Breath sounds: No wheezing or rales.  Abdominal:     General: There is no distension.  Palpations: Abdomen is soft.     Tenderness: There is abdominal tenderness (epigastric).  Musculoskeletal:        General: No tenderness.     Cervical back: Normal range of motion and neck supple.  Skin:    General: Skin is warm and dry.     Comments: Bruising to the medial aspect of the right forearm extends just distal to the elbow.  Neurological:     Mental Status: She is alert and oriented to person, place, and time.  Psychiatric:        Behavior: Behavior normal.     ED Results / Procedures / Treatments   Labs (all labs ordered are listed, but only abnormal results are displayed) Labs Reviewed  COMPREHENSIVE METABOLIC PANEL - Abnormal; Notable for the following components:      Result Value   Potassium 3.1 (*)    CO2 19 (*)    Glucose, Bld 120 (*)    BUN 21 (*)    Creatinine, Ser 1.49 (*)    Calcium 6.0 (*)    Total Protein 5.5 (*)    Albumin 2.4 (*)    GFR calc non Af Amer 40 (*)    GFR calc Af Amer 46 (*)    All other components within normal limits  LACTIC ACID, PLASMA - Abnormal; Notable for the following components:   Lactic Acid, Venous 2.9 (*)    All other components within normal limits  CBC - Abnormal; Notable for the following components:   WBC 1.4 (*)    RBC 2.46 (*)    Hemoglobin 6.5 (*)    HCT 20.6 (*)    RDW 18.9 (*)    Platelets <5 (*)    All other components within normal limits  CBC WITH DIFFERENTIAL/PLATELET - Abnormal; Notable for the following components:   WBC 1.4 (*)    RBC 2.46 (*)    Hemoglobin 6.4 (*)    HCT 20.0 (*)    RDW 18.8 (*)    Platelets <5 (*)    Neutro Abs 0.7 (*)     Lymphs Abs 0.5 (*)    All other components within normal limits  MAGNESIUM - Abnormal; Notable for the following components:   Magnesium 0.9 (*)    All other components within normal limits  PHOSPHORUS - Abnormal; Notable for the following components:   Phosphorus <1.0 (*)    All other components within normal limits  POCT I-STAT EG7 - Abnormal; Notable for the following components:   pH, Ven 7.452 (*)    pCO2, Ven 31.7 (*)    pO2, Ven 29.0 (*)    Potassium 2.8 (*)    Calcium, Ion 0.85 (*)    HCT 17.0 (*)    Hemoglobin 5.8 (*)    All other components within normal limits  RESPIRATORY PANEL BY RT PCR (FLU A&B, COVID)  CULTURE, BLOOD (ROUTINE X 2)  CULTURE, BLOOD (ROUTINE X 2)  LIPASE, BLOOD  URINALYSIS, ROUTINE W REFLEX MICROSCOPIC  CBC  LACTIC ACID, PLASMA  I-STAT BETA HCG BLOOD, ED ([facility], [facility], [facility] ONLY)  TYPE AND SCREEN  PREPARE RBC (CROSSMATCH)  PREPARE PLATELET PHERESIS  ABO/RH    EKG None  Radiology DG Chest Port 1 View  Result Date: 12/10/2019 CLINICAL DATA:  54 year old female with weakness. EXAM: PORTABLE CHEST 1 VIEW COMPARISON:  Chest radiograph dated 10/25/2019. FINDINGS: Right-sided Port-A-Cath with tip at the cavoatrial junction. There is no focal consolidation, pleural effusion, pneumothorax. Mild cardiomegaly. Atherosclerotic calcification of the  aortic arch. No acute osseous pathology. IMPRESSION: 1. No acute cardiopulmonary process. 2. Mild cardiomegaly. Electronically Signed   By: Anner Crete M.D.   On: 12/10/2019 16:21    Procedures Procedures (including critical care time) Procedure note: Ultrasound Guided Peripheral IV Ultrasound guided peripheral 1.88 inch angiocath IV placement performed by me. Indications: Nursing unable to place IV. Details: The antecubital fossa and upper arm were evaluated with a multifrequency linear probe. Patent brachial veins were noted. 1 attempt was made to cannulate a vein under realtime US guidance with successful  cannulation of the vein and catheter placement. There is return of non-pulsatile dark red blood. The patient tolerated the procedure well without complications. Images archived electronically.  CPT codes: [phone] and [phone]  Medications Ordered in ED Medications  sodium chloride flush (NS) 0.9 % injection 3 mL (3 mLs Intravenous Not Given 12/10/19 1542)  0.9 %  sodium chloride infusion (Manually program via Guardrails IV Fluids) (has no administration in time range)  0.9 %  sodium chloride infusion (has no administration in time range)  ondansetron (ZOFRAN) injection 4 mg (4 mg Intravenous Given 12/10/19 1853)  phosphorus (K PHOS NEUTRAL) tablet 500 mg (has no administration in time range)  magnesium sulfate IVPB 2 g 50 mL (has no administration in time range)  potassium chloride 10 mEq in 100 mL IVPB (has no administration in time range)  lactated ringers bolus 1,000 mL (0 mLs Intravenous Stopped 12/10/19 1633)  calcium gluconate 1 g in sodium chloride 0.9 % 100 mL IVPB (0 g Intravenous Stopped 12/10/19 1803)    ED Course  I have reviewed the triage vital signs and the nursing notes.  Pertinent labs & imaging results that were available during my care of the patient were reviewed by me and considered in my medical decision making (see chart for details).    MDM Rules/Calculators/A&P                      54 yo F here with a chief complaints of nausea vomiting and diarrhea and fatigue.  Going on for about 48 hours.  Patient is also had some easy bruising and nosebleeds.  Called her oncologist and they told him to come to the ED for evaluation.  The patient is pancytopenic with a white count of 1.4 hemoglobin of 6.5 and platelets that are measurably low.  I discussed this with her and discussed with her transfer to [facility] where she gets her primary oncology care.  The patient is declining and would prefer to stay here if possible.  I discussed the case with Dr. Irene Limbo, oncology.  He felt it was  reasonable to stay here though preferred to be seen by the primary oncology center and she would still need to go back to them for further evaluation and with her being on cisplatin he for like she could have a chronic need for transfusion.  He recommended transfusing the platelets with a goal of 10,000 and a hemoglobin with a goal of 7.5.  Will discuss with the hospitalist.  CRITICAL CARE Performed by: Cecilio Asper   Total critical care time: 80 minutes  Critical care time was exclusive of separately billable procedures and treating other patients.  Critical care was necessary to treat or prevent imminent or life-threatening deterioration.  Critical care was time spent personally by me on the following activities: development of treatment plan with patient and/or surrogate as well as nursing, discussions with consultants, evaluation of patient's  response to treatment, examination of patient, obtaining history from patient or surrogate, ordering and performing treatments and interventions, ordering and review of laboratory studies, ordering and review of radiographic studies, pulse oximetry and re-evaluation of patient's condition.  The patients results and plan were reviewed and discussed.   Any x-rays performed were independently reviewed by myself.   Differential diagnosis were considered with the presenting HPI.  Medications  sodium chloride flush (NS) 0.9 % injection 3 mL (3 mLs Intravenous Not Given 12/10/19 1542)  0.9 %  sodium chloride infusion (Manually program via Guardrails IV Fluids) (has no administration in time range)  0.9 %  sodium chloride infusion (has no administration in time range)  ondansetron (ZOFRAN) injection 4 mg (4 mg Intravenous Given 12/10/19 1853)  phosphorus (K PHOS NEUTRAL) tablet 500 mg (has no administration in time range)  magnesium sulfate IVPB 2 g 50 mL (has no administration in time range)  potassium chloride 10 mEq in 100 mL IVPB (has no  administration in time range)  lactated ringers bolus 1,000 mL (0 mLs Intravenous Stopped 12/10/19 1633)  calcium gluconate 1 g in sodium chloride 0.9 % 100 mL IVPB (0 g Intravenous Stopped 12/10/19 1803)    Vitals:   12/10/19 1815 12/10/19 1832 12/10/19 1845 12/10/19 1856  BP: 128/71 128/70 129/75   Pulse: (!) 113 (!) 114 (!) 113   Resp: (!) 22 (!) 29 (!) 27   Temp:  99.1 F (37.3 C)  99.9 F (37.7 C)  TempSrc:  Oral  Oral  SpO2: 100%  100%   Weight:      Height:        Final diagnoses:  Pancytopenia (H[facility])  Symptomatic anemia    Admission/ observation were discussed with the admitting physician, patient and/or family and they are comfortable with the plan.     Final Clinical Impression(s) / ED Diagnoses Final diagnoses:  Pancytopenia ([place])  Symptomatic anemia    Rx / DC Orders ED Discharge Orders    None       Deno Etienne, DO 12/10/19 1936

## 2019-12-10 NOTE — ED Triage Notes (Signed)
Pt presents to ED for 2-3 days of N/V, unable to eat, dehydration and abd discomfort. Pt sent here from her Oncologist for possible dehydration and infection

## 2019-12-11 ENCOUNTER — Encounter (HOSPITAL_COMMUNITY): Payer: Self-pay | Admitting: Internal Medicine

## 2019-12-11 ENCOUNTER — Inpatient Hospital Stay (HOSPITAL_COMMUNITY): Payer: BC Managed Care – PPO

## 2019-12-11 LAB — C DIFFICILE QUICK SCREEN W PCR REFLEX
C Diff antigen: NEGATIVE
C Diff interpretation: NOT DETECTED
C Diff toxin: NEGATIVE

## 2019-12-11 LAB — CBC WITH DIFFERENTIAL/PLATELET
Abs Immature Granulocytes: 0.08 10*3/uL — ABNORMAL HIGH (ref 0.00–0.07)
Basophils Absolute: 0 10*3/uL (ref 0.0–0.1)
Basophils Relative: 1 %
Eosinophils Absolute: 0 10*3/uL (ref 0.0–0.5)
Eosinophils Relative: 0 %
HCT: 26.5 % — ABNORMAL LOW (ref 36.0–46.0)
Hemoglobin: 8.7 g/dL — ABNORMAL LOW (ref 12.0–15.0)
Immature Granulocytes: 5 %
Lymphocytes Relative: 39 %
Lymphs Abs: 0.6 10*3/uL — ABNORMAL LOW (ref 0.7–4.0)
MCH: 26.7 pg (ref 26.0–34.0)
MCHC: 32.8 g/dL (ref 30.0–36.0)
MCV: 81.3 fL (ref 80.0–100.0)
Monocytes Absolute: 0.3 10*3/uL (ref 0.1–1.0)
Monocytes Relative: 21 %
Neutro Abs: 0.5 10*3/uL — ABNORMAL LOW (ref 1.7–7.7)
Neutrophils Relative %: 34 %
Platelets: 26 10*3/uL — CL (ref 150–400)
RBC: 3.26 MIL/uL — ABNORMAL LOW (ref 3.87–5.11)
RDW: 18.1 % — ABNORMAL HIGH (ref 11.5–15.5)
WBC: 1.5 10*3/uL — ABNORMAL LOW (ref 4.0–10.5)
nRBC: 0 % (ref 0.0–0.2)

## 2019-12-11 LAB — COMPREHENSIVE METABOLIC PANEL
ALT: 15 U/L (ref 0–44)
AST: 26 U/L (ref 15–41)
Albumin: 2.2 g/dL — ABNORMAL LOW (ref 3.5–5.0)
Alkaline Phosphatase: 91 U/L (ref 38–126)
Anion gap: 9 (ref 5–15)
BUN: 21 mg/dL — ABNORMAL HIGH (ref 6–20)
CO2: 21 mmol/L — ABNORMAL LOW (ref 22–32)
Calcium: 5.7 mg/dL — CL (ref 8.9–10.3)
Chloride: 106 mmol/L (ref 98–111)
Creatinine, Ser: 1.56 mg/dL — ABNORMAL HIGH (ref 0.44–1.00)
GFR calc Af Amer: 44 mL/min — ABNORMAL LOW (ref >60)
GFR calc non Af Amer: 38 mL/min — ABNORMAL LOW (ref >60)
Glucose, Bld: 143 mg/dL — ABNORMAL HIGH (ref 70–99)
Potassium: 3.2 mmol/L — ABNORMAL LOW (ref 3.5–5.1)
Sodium: 136 mmol/L (ref 135–145)
Total Bilirubin: 1.1 mg/dL (ref 0.3–1.2)
Total Protein: 4.9 g/dL — ABNORMAL LOW (ref 6.5–8.1)

## 2019-12-11 LAB — MAGNESIUM: Magnesium: 1.2 mg/dL — ABNORMAL LOW (ref 1.7–2.4)

## 2019-12-11 LAB — LACTIC ACID, PLASMA: Lactic Acid, Venous: 2.3 mmol/L (ref 0.5–1.9)

## 2019-12-11 LAB — PREPARE PLATELET PHERESIS: Unit division: 0

## 2019-12-11 LAB — BPAM PLATELET PHERESIS
Blood Product Expiration Date: 202104252359
ISSUE DATE / TIME: 202104231813
Unit Type and Rh: 6200

## 2019-12-11 LAB — PHOSPHORUS: Phosphorus: <1 mg/dL — CL (ref 2.5–4.6)

## 2019-12-11 MED ORDER — MAGNESIUM SULFATE 2 GM/50ML IV SOLN
2.0000 g | Freq: Once | INTRAVENOUS | Status: AC
  Administered 2019-12-11: 2 g via INTRAVENOUS
  Filled 2019-12-11: qty 50

## 2019-12-11 MED ORDER — SODIUM CHLORIDE 0.9% FLUSH
10.0000 mL | Freq: Two times a day (BID) | INTRAVENOUS | Status: DC
  Administered 2019-12-11 – 2019-12-16 (×5): 10 mL

## 2019-12-11 MED ORDER — ENSURE ENLIVE PO LIQD
237.0000 mL | Freq: Three times a day (TID) | ORAL | Status: DC
  Administered 2019-12-16 (×2): 237 mL via ORAL

## 2019-12-11 MED ORDER — LACTATED RINGERS IV SOLN
INTRAVENOUS | Status: DC
  Administered 2019-12-11: 100 mL/h via INTRAVENOUS

## 2019-12-11 MED ORDER — SODIUM CHLORIDE 0.9 % IV SOLN
2.0000 g | INTRAVENOUS | Status: AC
  Administered 2019-12-11: 2 g via INTRAVENOUS

## 2019-12-11 MED ORDER — SODIUM CHLORIDE 0.9 % IV SOLN
2.0000 g | Freq: Two times a day (BID) | INTRAVENOUS | Status: DC
  Administered 2019-12-12: 2 g via INTRAVENOUS
  Filled 2019-12-11: qty 2

## 2019-12-11 MED ORDER — CALCIUM GLUCONATE-NACL 1-0.675 GM/50ML-% IV SOLN
1.0000 g | Freq: Once | INTRAVENOUS | Status: AC
  Administered 2019-12-11: 19:00:00 1000 mg via INTRAVENOUS
  Filled 2019-12-11: qty 50

## 2019-12-11 MED ORDER — ENSURE ENLIVE PO LIQD: 237.0000 mL | Freq: Two times a day (BID) | ORAL | Status: DC

## 2019-12-11 MED ORDER — CHLORHEXIDINE GLUCONATE CLOTH 2 % EX PADS
6.0000 | MEDICATED_PAD | Freq: Every day | CUTANEOUS | Status: DC
  Administered 2019-12-12 – 2019-12-18 (×7): 6 via TOPICAL

## 2019-12-11 MED ORDER — POTASSIUM PHOSPHATES 15 MMOLE/5ML IV SOLN
20.0000 mmol | Freq: Once | INTRAVENOUS | Status: AC
  Administered 2019-12-11: 18:00:00 20 mmol via INTRAVENOUS
  Filled 2019-12-11: qty 6.67

## 2019-12-11 MED ORDER — SODIUM CHLORIDE 0.9% FLUSH: 10.0000 mL | INTRAVENOUS | Status: DC | PRN

## 2019-12-11 NOTE — Progress Notes (Signed)
[facility] Progress Note  Patient: Melissa Hurley    [id number]  DOA: 12/10/2019     Date of Service: the patient was seen and examined on 12/11/2019  Chief Complaint  Patient presents with  . Nausea  . Weakness  . Emesis   Brief hospital course: Past medical history of, metastatic intrahepatic cholangiocarcinoma, followed at [facility] and currently undergoing tx cisplatin/gemcitabine, who was recently hospitalized in 10/2019 due to acute left MCA CVA and started on eliquis presents with nausea, vomiting, poor p.o. intake.  Found to have hypokalemia, hypomagnesemia, hypophosphatemia as well as pancytopenia with undetectable platelets.  Currently further plan is monitor blood counts and supportive measures.  Assessment and Plan: 1.  Stage IV metastatic intrahepatic cholangiocarcinoma. Pancytopenia due to chemotherapy. Present generalized fatigue found to have hemoglobin 6.5, platelets undetectable, neutropenia. EDP discussed with hematology on-call Dr. Irene Limbo who recommended supportive measures with transfusion. Patient has received 2 PRBC and 1 platelet transfusion. Explained to the patient that she remains at risk for spontaneous bleeding with her platelets being less then 10. Having difficulty with repeat blood work.  Will monitor response.  Will discuss with hematology regarding the need for Nplate or Neulasta.  2.  Febrile neutropenia. SIRS POA Overnight temperature 100.7 lasted for at least an hour. Currently patient does not have any acute symptoms other than diarrhea. Chest x-ray unremarkable. Blood cultures currently pending. COVID-19 negative. Add IV cefepime.  3.  Diarrhea. Suspect secondary to chemotherapy although chronology is not appropriate for chemotherapy-induced diarrhea. Currently I will rule out C. difficile. Continue with IV hydration. Patient currently on IV cefepime.  4.  Multiple electrolyte imbalance, hypokalemia, hypomagnesemia,  hypophosphatemia, hypocalcemia Acute kidney injury Continue with IV hydration. Currently being replaced. Monitor response.  5.  Nausea vomiting and poor p.o. intake In the setting of recent chemotherapy. Monitor.  Nausea currently resolved.  No vomiting in the hospital.  6.  Recent left MCA CVA Started on Eliquis. In the setting of thrombocytopenia currently holding all medications.  7.  Bilateral lower extremity purpura.  Monitor.  8.  Port-A-Cath in place. Currently accessed flushing well but unable to draw labs. Continue for now as patient has poor vascular access due to severe dehydration. Use foot stick for blood draw.  Body mass index is 32.1 kg/m.  Nutrition Problem: Increased nutrient needs Etiology: cancer and cancer related treatments Interventions: Interventions: Ensure Enlive (each supplement provides 350kcal and 20 grams of protein)  Diet: regular diet DVT Prophylaxis: SCD, pharmacological prophylaxis contraindicated due to Severe thrombocytopenia   Advance goals of care discussion: Full code  Family Communication: family was present at bedside, at the time of interview.  The pt provided permission to discuss medical plan with the family. Opportunity was given to ask question and all questions were answered satisfactorily.   Disposition:  Status is: Inpatient  Remains inpatient appropriate because:Hemodynamically unstable, Persistent severe electrolyte disturbances and Ongoing diagnostic testing needed not appropriate for outpatient work up   Dispo: The patient is from: Home              Anticipated d/c is to: Home              Anticipated d/c date is: 3 days              Patient currently is not medically stable to d/c.   Subjective: Mild nausea.  No vomiting or abdominal pain.  Improving appetite.  No chest pain.  No shortness of breath.  No  leg pain.  Physical Exam: General:  alert oriented to time, place, and person.  Appear in mild distress,  affect appropriate Eyes: PERRL ENT: Oral Mucosa Clear, moist  Neck: no JVD,  Cardiovascular: S1 and S2 Present, no Murmur,  Respiratory: good respiratory effort, Bilateral Air entry equal and Decreased, no Crackles, no wheezes Abdomen: Bowel Sound present, Soft and no tenderness,  Skin: Petechial rash bilateral leg Extremities: no Pedal edema, no calf tenderness Neurologic: without any new focal findings  Gait not checked due to patient safety concerns  Vitals:   12/11/19 0918 12/11/19 0920 12/11/19 0930 12/11/19 0955  BP:      Pulse: (!) 101 (!) 102 (!) 103 100  Resp: (!) 26 (!) 28 (!) 27 (!) 24  Temp:      TempSrc:      SpO2: 100% 100% 100% 100%  Weight:      Height:        Intake/Output Summary (Last 24 hours) at 12/11/2019 1227 Last data filed at 12/11/2019 0800 Gross per 24 hour  Intake 3500.06 ml  Output -  Net 3500.06 ml   Filed Weights   12/10/19 1430 12/10/19 2222  Weight: 93 kg 90.2 kg    Data Reviewed: I have personally reviewed and interpreted daily labs, tele strips, imagings as discussed above. I reviewed all nursing notes, pharmacy notes, vitals, pertinent old records I have discussed plan of care as described above with RN and patient/family.  CBC: Recent Labs  Lab 12/10/19 1530 12/10/19 1711 12/10/19 1808  WBC 1.4* 1.4*  --   NEUTROABS  --  0.7*  --   HGB 6.5* 6.4* 5.8*  HCT 20.6* 20.0* 17.0*  MCV 83.7 81.3  --   PLT <5* <5*  --    Basic Metabolic Panel: Recent Labs  Lab 12/10/19 1422 12/10/19 1711 12/10/19 1808  NA 137  --  138  K 3.1*  --  2.8*  CL 104  --   --   CO2 19*  --   --   GLUCOSE 120*  --   --   BUN 21*  --   --   CREATININE 1.49*  --   --   CALCIUM 6.0*  --   --   MG  --  0.9*  --   PHOS  --  <1.0*  --     Studies: DG Chest Port 1 View  Result Date: 12/10/2019 CLINICAL DATA:  54 year old female with weakness. EXAM: PORTABLE CHEST 1 VIEW COMPARISON:  Chest radiograph dated 10/25/2019. FINDINGS: Right-sided  Port-A-Cath with tip at the cavoatrial junction. There is no focal consolidation, pleural effusion, pneumothorax. Mild cardiomegaly. Atherosclerotic calcification of the aortic arch. No acute osseous pathology. IMPRESSION: 1. No acute cardiopulmonary process. 2. Mild cardiomegaly. Electronically Signed   By: Anner Crete M.D.   On: 12/10/2019 16:21    Scheduled Meds: . sodium chloride   Intravenous Once  . Chlorhexidine Gluconate Cloth  6 each Topical Daily  . feeding supplement (ENSURE ENLIVE)  237 mL Oral TID BM  . megestrol  400 mg Oral BID  . PARoxetine  10 mg Oral Daily  . phosphorus  500 mg Oral BID  . sodium chloride flush  10-40 mL Intracatheter Q12H  . sodium chloride flush  3 mL Intravenous Once  . sodium chloride flush  3 mL Intravenous Q12H   Continuous Infusions: . sodium chloride 100 mL/hr at 12/10/19 2031  . ceFEPime (MAXIPIME) IV    . [START ON 12/12/2019] ceFEPime (  MAXIPIME) IV     PRN Meds: acetaminophen **OR** acetaminophen, LORazepam, ondansetron (ZOFRAN) IV, sodium chloride flush  Time spent: 35 minutes  Author: Berle Mull, MD [facility] 12/11/2019 12:27 PM  To reach On-call, see care teams to locate the attending and reach out to them via [email or web address]. If 7PM-7AM, please contact night-coverage If you still have difficulty reaching the attending provider, please page the [facility] (Director on Call) for [facility] on amion for assistance.

## 2019-12-11 NOTE — Progress Notes (Signed)
PHARMACY - PHYSICIAN COMMUNICATION CRITICAL VALUE ALERT - BLOOD CULTURE IDENTIFICATION (BCID)  No results found for this or any previous visit.  Micro lab called with Blood Cx GPR on gram stain but no BCID   Name of physician (or Provider) Contacted: none   Changes to prescribed antibiotics required: current ABX Cefepime   Bonnita Nasuti Pharm.D. CPP, BCPS Clinical Pharmacist [phone] 12/11/2019 9:39 PM

## 2019-12-11 NOTE — Progress Notes (Signed)
Initial Nutrition Assessment  DOCUMENTATION CODES:   Obesity unspecified  INTERVENTION:  Provide Ensure Enlive po TID, each supplement provides 350 kcal and 20 grams of protein.  Monitor magnesium, potassium, and phosphorus daily for at least 3 days, MD to replete as needed, as pt is at risk for refeeding syndrome given prolonged poor intake.  Encourage adequate PO intake.   NUTRITION DIAGNOSIS:   Increased nutrient needs related to cancer and cancer related treatments as evidenced by estimated needs.  GOAL:   Patient will meet greater than or equal to 90% of their needs  MONITOR:   PO intake, Supplement acceptance, Skin, Weight trends, Labs, I & O's  REASON FOR ASSESSMENT:   Malnutrition Screening Tool    ASSESSMENT:   54 y.o. female with medical history significant of metastatic intrahepatic cholangiocarcinoma currently undergoing chemotherapy presents with nausea, vomiting, poor p.o. intake, pancytopenia due to chemotherapy, and SIRS.  RD working remotely.  Pt unavailable during attempted time of contact. Per MD, pt with poor po intake for several months since start of chemotherapy. Per weight records, pt with a 13% weight loss in 4 months, which is significant for time frame. Pt at risk for refeeding syndrome due to prolonged poor intake and significant weight loss. RD to order nutritional supplements to aid in caloric and protein needs.   Unable to complete Nutrition-Focused physical exam at this time.   Labs and medications reviewed. Potassium low at 2.8. Phosphorous low at <1.0. Magnesium low at 0.9. Magnesium sulfate given. Megace ordered.   Diet Order:   Diet Order            Diet regular Room service appropriate? Yes with Assist; Fluid consistency: Thin  Diet effective now              EDUCATION NEEDS:   Not appropriate for education at this time  Skin:  Skin Assessment: Reviewed RN Assessment  Last BM:  4/24  Height:   Ht Readings from Last 1  Encounters:  12/10/19 5\' 6"  (1.676 m)    Weight:   Wt Readings from Last 1 Encounters:  12/10/19 90.2 kg    Ideal Body Weight:  59 kg  BMI:  Body mass index is 32.1 kg/m.  Estimated Nutritional Needs:   Kcal:  2100-2300  Protein:  110-120 grams  Fluid:  >/= 2 L/day    Corrin Parker, MS, RD, LDN RD pager number/after hours weekend pager number on Am[employer].

## 2019-12-11 NOTE — Progress Notes (Signed)
HGB 5.8, second unit of blood running at this time with no reaction noted

## 2019-12-11 NOTE — Progress Notes (Signed)
Patient ambulating to commode.  Pulse increased temporarily.

## 2019-12-11 NOTE — Progress Notes (Signed)
Pharmacy Antibiotic Note  Melissa Hurley is a 54 y.o. female admitted on 12/10/2019 with febrile neutropenia.  Pharmacy has been consulted for Cefepime dosing.  CC/HPI: N/V, poor po intake for several months. Recent epistaxis, arm bruising. Plts <5. Last chemo 2 weeks ago.   PMH: metastatic intrahepatic cholangiocarcinoma (tx cisplatin/gemcitabine), recently hospitalized in 10/2019 due to acute left MCA CVA and started on eliquis,   ID: Tmax 100.7. Scr 1.49. LA 2.2. wBC 1.4. Start Cefepime for febrile neutropenia. Cefepime 4/24>>  Plan: Cefepime 2g IV q12h hrs    Height: 5\' 6"  (167.6 cm) Weight: 90.2 kg (198 lb 13.7 oz) IBW/kg (Calculated) : 59.3  Temp (24hrs), Avg:99.2 F (37.3 C), Min:98 F (36.7 C), Max:100.7 F (38.2 C)  Recent Labs  Lab 12/10/19 1422 12/10/19 1528 12/10/19 1530 12/10/19 1711 12/10/19 2048  WBC  --   --  1.4* 1.4*  --   CREATININE 1.49*  --   --   --   --   LATICACIDVEN  --  2.9*  --   --  2.2*    Estimated Creatinine Clearance: 50 mL/min (A) (by C-G formula based on SCr of 1.49 mg/dL (H)).    No Known Allergies  Kamsiyochukwu Spickler S. Alford Highland, PharmD, BCPS Clinical Staff Pharmacist [email or web address]  Wayland Salinas 12/11/2019 12:16 PM

## 2019-12-11 NOTE — Progress Notes (Signed)
Patient C/O of nausea and refuse P.O medications, MEWS at this time was 4/3 this nurse offer to give her zofran but patient refused. Doctor on call notified. Patient temperature is 100.0 at this time doctor on call stated is ok to transfuse patient with the ordered PRBC

## 2019-12-12 LAB — BPAM RBC
Blood Product Expiration Date: 202105252359
Blood Product Expiration Date: 202105262359
ISSUE DATE / TIME: 202104240146
ISSUE DATE / TIME: 202104240435
Unit Type and Rh: 5100
Unit Type and Rh: 5100

## 2019-12-12 LAB — BASIC METABOLIC PANEL
Anion gap: 11 (ref 5–15)
Anion gap: 11 (ref 5–15)
BUN: 19 mg/dL (ref 6–20)
BUN: 22 mg/dL — ABNORMAL HIGH (ref 6–20)
CO2: 17 mmol/L — ABNORMAL LOW (ref 22–32)
CO2: 17 mmol/L — ABNORMAL LOW (ref 22–32)
Calcium: 5.6 mg/dL — CL (ref 8.9–10.3)
Calcium: 5.9 mg/dL — CL (ref 8.9–10.3)
Chloride: 109 mmol/L (ref 98–111)
Chloride: 109 mmol/L (ref 98–111)
Creatinine, Ser: 1.29 mg/dL — ABNORMAL HIGH (ref 0.44–1.00)
Creatinine, Ser: 1.43 mg/dL — ABNORMAL HIGH (ref 0.44–1.00)
GFR calc Af Amer: 55 mL/min — ABNORMAL LOW (ref >60)
GFR calc Af Amer: 49 mL/min — ABNORMAL LOW (ref >60)
GFR calc non Af Amer: 48 mL/min — ABNORMAL LOW (ref >60)
GFR calc non Af Amer: 42 mL/min — ABNORMAL LOW (ref >60)
Glucose, Bld: 90 mg/dL (ref 70–99)
Glucose, Bld: 122 mg/dL — ABNORMAL HIGH (ref 70–99)
Potassium: 3.1 mmol/L — ABNORMAL LOW (ref 3.5–5.1)
Potassium: 3.1 mmol/L — ABNORMAL LOW (ref 3.5–5.1)
Sodium: 137 mmol/L (ref 135–145)
Sodium: 137 mmol/L (ref 135–145)

## 2019-12-12 LAB — CBC
HCT: 25.7 % — ABNORMAL LOW (ref 36.0–46.0)
Hemoglobin: 8.4 g/dL — ABNORMAL LOW (ref 12.0–15.0)
MCH: 27 pg (ref 26.0–34.0)
MCHC: 32.7 g/dL (ref 30.0–36.0)
MCV: 82.6 fL (ref 80.0–100.0)
Platelets: 28 10*3/uL — CL (ref 150–400)
RBC: 3.11 MIL/uL — ABNORMAL LOW (ref 3.87–5.11)
RDW: 18.4 % — ABNORMAL HIGH (ref 11.5–15.5)
WBC: 1.3 10*3/uL — CL (ref 4.0–10.5)
nRBC: 1.5 % — ABNORMAL HIGH (ref 0.0–0.2)

## 2019-12-12 LAB — CULTURE, BLOOD (ROUTINE X 2): Special Requests: ADEQUATE

## 2019-12-12 LAB — MAGNESIUM
Magnesium: 1.6 mg/dL — ABNORMAL LOW (ref 1.7–2.4)
Magnesium: 2.2 mg/dL (ref 1.7–2.4)

## 2019-12-12 LAB — TYPE AND SCREEN
ABO/RH(D): O POS
Antibody Screen: NEGATIVE
Unit division: 0
Unit division: 0

## 2019-12-12 LAB — PHOSPHORUS: Phosphorus: 2 mg/dL — ABNORMAL LOW (ref 2.5–4.6)

## 2019-12-12 LAB — LACTIC ACID, PLASMA: Lactic Acid, Venous: 1.2 mmol/L (ref 0.5–1.9)

## 2019-12-12 MED ORDER — VANCOMYCIN HCL IN DEXTROSE 1-5 GM/200ML-% IV SOLN: 1000.0000 mg | INTRAVENOUS | Status: DC

## 2019-12-12 MED ORDER — MAGNESIUM SULFATE 2 GM/50ML IV SOLN
2.0000 g | Freq: Once | INTRAVENOUS | Status: AC
  Administered 2019-12-12: 11:00:00 2 g via INTRAVENOUS
  Filled 2019-12-12: qty 50

## 2019-12-12 MED ORDER — SODIUM CHLORIDE 0.9 % IV SOLN
1.0000 g | Freq: Three times a day (TID) | INTRAVENOUS | Status: DC
  Filled 2019-12-12 (×2): qty 1

## 2019-12-12 MED ORDER — CALCIUM GLUCONATE-NACL 1-0.675 GM/50ML-% IV SOLN
1.0000 g | Freq: Once | INTRAVENOUS | Status: AC
  Administered 2019-12-12: 1000 mg via INTRAVENOUS
  Filled 2019-12-12: qty 50

## 2019-12-12 MED ORDER — PIPERACILLIN-TAZOBACTAM 3.375 G IVPB 30 MIN
3.3750 g | Freq: Once | INTRAVENOUS | Status: AC
  Administered 2019-12-12: 09:00:00 3.375 g via INTRAVENOUS
  Filled 2019-12-12 (×2): qty 50

## 2019-12-12 MED ORDER — VANCOMYCIN HCL IN DEXTROSE 1-5 GM/200ML-% IV SOLN
1000.0000 mg | INTRAVENOUS | Status: DC
  Administered 2019-12-13 – 2019-12-16 (×4): 1000 mg via INTRAVENOUS
  Filled 2019-12-12 (×4): qty 200

## 2019-12-12 MED ORDER — VANCOMYCIN HCL 1750 MG/350ML IV SOLN
1750.0000 mg | Freq: Once | INTRAVENOUS | Status: DC
  Filled 2019-12-12: qty 350

## 2019-12-12 MED ORDER — SODIUM CHLORIDE 0.9 % IV SOLN
1.0000 g | Freq: Three times a day (TID) | INTRAVENOUS | Status: DC
  Administered 2019-12-12 – 2019-12-13 (×3): 1 g via INTRAVENOUS
  Filled 2019-12-12 (×5): qty 1

## 2019-12-12 MED ORDER — PIPERACILLIN-TAZOBACTAM 3.375 G IVPB: 3.3750 g | Freq: Three times a day (TID) | INTRAVENOUS | Status: DC

## 2019-12-12 MED ORDER — CALCIUM GLUCONATE-NACL 2-0.675 GM/100ML-% IV SOLN
2.0000 g | Freq: Once | INTRAVENOUS | Status: AC
  Administered 2019-12-12: 2000 mg via INTRAVENOUS
  Filled 2019-12-12: qty 100

## 2019-12-12 MED ORDER — VANCOMYCIN HCL 1750 MG/350ML IV SOLN
1750.0000 mg | Freq: Once | INTRAVENOUS | Status: AC
  Administered 2019-12-12: 1750 mg via INTRAVENOUS
  Filled 2019-12-12: qty 350

## 2019-12-12 MED ORDER — K PHOS MONO-SOD PHOS DI & MONO 155-852-130 MG PO TABS
500.0000 mg | ORAL_TABLET | Freq: Three times a day (TID) | ORAL | Status: DC
  Administered 2019-12-12 – 2019-12-14 (×8): 500 mg via ORAL
  Filled 2019-12-12 (×10): qty 2

## 2019-12-12 MED ORDER — POTASSIUM CHLORIDE CRYS ER 20 MEQ PO TBCR
40.0000 meq | EXTENDED_RELEASE_TABLET | Freq: Once | ORAL | Status: AC
  Administered 2019-12-12: 40 meq via ORAL
  Filled 2019-12-12: qty 2

## 2019-12-12 NOTE — Progress Notes (Signed)
There is no change in patient

## 2019-12-12 NOTE — Progress Notes (Signed)
Pharmacy Antibiotic Note  Melissa Hurley is a 54 y.o. female admitted on 12/10/2019 with sepsis.  Pharmacy has been consulted for Vanco, Merrem dosing.  ID: Tmax 102.7. Scr 1.29 down slightly. LA 2.2. wBC 1.3. Start Cefepime for febrile neutropenia>>transition to Vanco/Merrem per ID Rx recommendation to cover for GPR in [facility] and empirically for listeria.  Cefepime 4/24>> 4/25 Vanco 4/25>> Merrem 4/25>>  4/23: BC x 2: GPRods in 2 out of 4 bottles. 4/24: Cdiff negative 4/23: COVID negative, Flu negative  Plan: Change abx to Vanco 1750mg  IV x 1 then 1g IV q24h (AUC 503, Scr 1.29, VD 0.5) Merrem 1g IV q8hrs. Narrow as cultures become available.    Height: 5\' 6"  (167.6 cm) Weight: 90.2 kg (198 lb 13.7 oz) IBW/kg (Calculated) : 59.3  Temp (24hrs), Avg:100.4 F (38 C), Min:98.4 F (36.9 C), Max:102.7 F (39.3 C)  Recent Labs  Lab 12/10/19 1422 12/10/19 1528 12/10/19 1530 12/10/19 1711 12/10/19 2048 12/11/19 1302 12/12/19 0739  WBC  --   --  1.4* 1.4*  --  1.5* 1.3*  CREATININE 1.49*  --   --   --   --  1.56* 1.29*  LATICACIDVEN  --  2.9*  --   --  2.2* 2.3* 1.2    Estimated Creatinine Clearance: 57.7 mL/min (A) (by C-G formula based on SCr of 1.29 mg/dL (H)).    No Known Allergies  Amellia Panik S. Alford Highland, PharmD, BCPS Clinical Staff Pharmacist [email or web address]  Wayland Salinas 12/12/2019 9:49 AM

## 2019-12-12 NOTE — Progress Notes (Signed)
This note also relates to the following rows which could not be included: Pulse Rate - Cannot attach notes to unvalidated device data ECG Heart Rate - Cannot attach notes to unvalidated device data Resp - Cannot attach notes to unvalidated device data SpO2 - Cannot attach notes to unvalidated device data  Patent has been having rapid respiration rate

## 2019-12-12 NOTE — Progress Notes (Signed)
[facility] Progress Note  Patient: Melissa Hurley    [id number]  DOA: 12/10/2019     Date of Service: the patient was seen and examined on 12/12/2019  Chief Complaint  Patient presents with  . Nausea  . Weakness  . Emesis   Brief hospital course: Past medical history of, metastatic intrahepatic cholangiocarcinoma, followed at [facility] and currently undergoing tx cisplatin/gemcitabine, who was recently hospitalized in 10/2019 due to acute left MCA CVA and started on eliquis presents with nausea, vomiting, poor p.o. intake.  Found to have hypokalemia, hypomagnesemia, hypophosphatemia as well as pancytopenia with undetectable platelets.  Currently further plan is monitor blood counts and supportive measures.  Assessment and Plan: 1.  Stage IV metastatic intrahepatic cholangiocarcinoma. Pancytopenia due to chemotherapy. Present generalized fatigue found to have hemoglobin 6.5, platelets undetectable, neutropenia. EDP discussed with hematology on-call Dr. Irene Limbo who recommended supportive measures with transfusion. Patient has received 2 PRBC and 1 platelet transfusion. Platelets improved, hemoglobin remained stable. No active bleeding.  2.  Febrile neutropenia. Corynebacterium bacteremia SIRS POA Overnight temperature 100.7 lasted for at least an hour. Currently patient does not have any acute symptoms other than diarrhea. Chest x-ray unremarkable. Blood cultures currently growing 2 out of 4 Corynebacterium.  Currently on IV vancomycin and meropenem. We will consult ID for further assistance. COVID-19 negative.  3.  Diarrhea. Suspect secondary to chemotherapy although chronology is not appropriate for chemotherapy-induced diarrhea. Currently I will rule out C. difficile. Continue with IV hydration. Patient currently on IV cefepime.  4.  Multiple electrolyte imbalance, hypokalemia, hypomagnesemia, hypophosphatemia, hypocalcemia Acute kidney  injury Continue with IV hydration. Currently being replaced. Monitor response.  5.  Nausea vomiting and poor p.o. intake In the setting of recent chemotherapy. Monitor.  Nausea currently resolved.  No vomiting in the hospital.  6.  Recent left MCA CVA Started on Eliquis. In the setting of thrombocytopenia currently holding all medications.  7.  Bilateral lower extremity purpura.  Monitor.  Currently stable  8.  Port-A-Cath in place. Currently accessed flushing well but unable to draw labs. Continue for now as patient has poor vascular access due to severe dehydration. Can use foot stick for blood draw.  Body mass index is 32.1 kg/m.  Nutrition Problem: Increased nutrient needs Etiology: cancer and cancer related treatments Interventions: Interventions: Ensure Enlive (each supplement provides 350kcal and 20 grams of protein)  Diet: regular diet DVT Prophylaxis: SCD, pharmacological prophylaxis contraindicated due to Severe thrombocytopenia   Advance goals of care discussion: Full code  Family Communication: family was present at bedside, at the time of interview.  The pt provided permission to discuss medical plan with the family. Opportunity was given to ask question and all questions were answered satisfactorily.   Disposition:  Status is: Inpatient  Remains inpatient appropriate because:Hemodynamically unstable, Persistent severe electrolyte disturbances and Ongoing diagnostic testing needed not appropriate for outpatient work up   Dispo: The patient is from: Home              Anticipated d/c is to: Home              Anticipated d/c date is: 3 days              Patient currently is not medically stable to d/c.   Subjective: 1 BM today.  2 BM yesterday.  No abdominal pain.  No nausea no vomiting.  Physical Exam: General: Appear in mild distress, petechial rash; Oral Mucosa Clear, moist. no Abnormal Neck  Mass Or lumps, Conjunctiva normal  Cardiovascular: S1 and S2  Present, no Murmur, Respiratory: normal respiratory effort, Bilateral Air entry present and Clear to Auscultation, no Crackles, no wheezes Abdomen: Bowel Sound present, Soft and no tenderness, no hernia Extremities: no Pedal edema, no calf tenderness Neurology: alert and oriented to time, place, and person affect appropriate.  Vitals:   12/12/19 0602 12/12/19 0604 12/12/19 0822 12/12/19 1300  BP:   (!) 130/94   Pulse: 96 96    Resp: (!) 27 (!) 26    Temp:   98.4 F (36.9 C) 97.9 F (36.6 C)  TempSrc:   Oral Oral  SpO2: 100% 100%    Weight:      Height:        Intake/Output Summary (Last 24 hours) at 12/12/2019 1602 Last data filed at 12/12/2019 0800 Gross per 24 hour  Intake 240 ml  Output --  Net 240 ml   Filed Weights   12/10/19 1430 12/10/19 2222  Weight: 93 kg 90.2 kg    Data Reviewed: I have personally reviewed and interpreted daily labs, tele strips, imagings as discussed above. I reviewed all nursing notes, pharmacy notes, vitals, pertinent old records I have discussed plan of care as described above with RN and patient/family.  CBC: Recent Labs  Lab 12/10/19 1530 12/10/19 1711 12/10/19 1808 12/11/19 1302 12/12/19 0739  WBC 1.4* 1.4*  --  1.5* 1.3*  NEUTROABS  --  0.7*  --  0.5*  --   HGB 6.5* 6.4* 5.8* 8.7* 8.4*  HCT 20.6* 20.0* 17.0* 26.5* 25.7*  MCV 83.7 81.3  --  81.3 82.6  PLT <5* <5*  --  26* 28*   Basic Metabolic Panel: Recent Labs  Lab 12/10/19 1422 12/10/19 1711 12/10/19 1808 12/11/19 1302 12/12/19 0739  NA 137  --  138 136 137  K 3.1*  --  2.8* 3.2* 3.1*  CL 104  --   --  106 109  CO2 19*  --   --  21* 17*  GLUCOSE 120*  --   --  143* 90  BUN 21*  --   --  21* 19  CREATININE 1.49*  --   --  1.56* 1.29*  CALCIUM 6.0*  --   --  5.7* 5.6*  MG  --  0.9*  --  1.2* 1.6*  PHOS  --  <1.0*  --  <1.0* 2.0*    Studies: No results found.  Scheduled Meds: . sodium chloride   Intravenous Once  . Chlorhexidine Gluconate Cloth  6 each  Topical Daily  . feeding supplement (ENSURE ENLIVE)  237 mL Oral TID BM  . PARoxetine  10 mg Oral Daily  . phosphorus  500 mg Oral TID  . sodium chloride flush  10-40 mL Intracatheter Q12H  . sodium chloride flush  3 mL Intravenous Once  . sodium chloride flush  3 mL Intravenous Q12H   Continuous Infusions: . lactated ringers 100 mL/hr (12/11/19 1831)  . meropenem (MERREM) IV 1 g (12/12/19 1116)  . [START ON 12/13/2019] vancomycin     PRN Meds: acetaminophen **OR** acetaminophen, LORazepam, ondansetron (ZOFRAN) IV, sodium chloride flush  Time spent: 35 minutes  Author: Berle Mull, MD [facility] 12/12/2019 4:02 PM  To reach On-call, see care teams to locate the attending and reach out to them via [email or web address]. If 7PM-7AM, please contact night-coverage If you still have difficulty reaching the attending provider, please page the [facility] (Director on Call) for [facility] on [employer]  for assistance.

## 2019-12-12 NOTE — Progress Notes (Signed)
No change from previous.

## 2019-12-13 ENCOUNTER — Inpatient Hospital Stay (HOSPITAL_COMMUNITY): Payer: BC Managed Care – PPO

## 2019-12-13 DIAGNOSIS — Z95828 Presence of other vascular implants and grafts: Secondary | ICD-10-CM

## 2019-12-13 DIAGNOSIS — Z79899 Other long term (current) drug therapy: Secondary | ICD-10-CM

## 2019-12-13 DIAGNOSIS — R7881 Bacteremia: Secondary | ICD-10-CM

## 2019-12-13 DIAGNOSIS — I313 Pericardial effusion (noninflammatory): Secondary | ICD-10-CM

## 2019-12-13 DIAGNOSIS — C221 Intrahepatic bile duct carcinoma: Secondary | ICD-10-CM

## 2019-12-13 DIAGNOSIS — Z8673 Personal history of transient ischemic attack (TIA), and cerebral infarction without residual deficits: Secondary | ICD-10-CM

## 2019-12-13 DIAGNOSIS — R42 Dizziness and giddiness: Secondary | ICD-10-CM

## 2019-12-13 DIAGNOSIS — B9689 Other specified bacterial agents as the cause of diseases classified elsewhere: Secondary | ICD-10-CM

## 2019-12-13 DIAGNOSIS — C7951 Secondary malignant neoplasm of bone: Secondary | ICD-10-CM

## 2019-12-13 LAB — CBC WITH DIFFERENTIAL/PLATELET
Abs Immature Granulocytes: 0.02 10*3/uL (ref 0.00–0.07)
Basophils Absolute: 0 10*3/uL (ref 0.0–0.1)
Basophils Relative: 1 %
Eosinophils Absolute: 0 10*3/uL (ref 0.0–0.5)
Eosinophils Relative: 1 %
HCT: 28.1 % — ABNORMAL LOW (ref 36.0–46.0)
Hemoglobin: 8.9 g/dL — ABNORMAL LOW (ref 12.0–15.0)
Immature Granulocytes: 1 %
Lymphocytes Relative: 49 %
Lymphs Abs: 0.9 10*3/uL (ref 0.7–4.0)
MCH: 26.1 pg (ref 26.0–34.0)
MCHC: 31.7 g/dL (ref 30.0–36.0)
MCV: 82.4 fL (ref 80.0–100.0)
Monocytes Absolute: 0.6 10*3/uL (ref 0.1–1.0)
Monocytes Relative: 35 %
Neutro Abs: 0.2 10*3/uL — ABNORMAL LOW (ref 1.7–7.7)
Neutrophils Relative %: 13 %
Platelets: 50 10*3/uL — ABNORMAL LOW (ref 150–400)
RBC: 3.41 MIL/uL — ABNORMAL LOW (ref 3.87–5.11)
RDW: 18.8 % — ABNORMAL HIGH (ref 11.5–15.5)
WBC: 1.8 10*3/uL — ABNORMAL LOW (ref 4.0–10.5)
nRBC: 0 % (ref 0.0–0.2)

## 2019-12-13 LAB — BASIC METABOLIC PANEL
Anion gap: 8 (ref 5–15)
Anion gap: 13 (ref 5–15)
BUN: 21 mg/dL — ABNORMAL HIGH (ref 6–20)
BUN: 23 mg/dL — ABNORMAL HIGH (ref 6–20)
CO2: 20 mmol/L — ABNORMAL LOW (ref 22–32)
CO2: 18 mmol/L — ABNORMAL LOW (ref 22–32)
Calcium: 6 mg/dL — CL (ref 8.9–10.3)
Calcium: 6.6 mg/dL — ABNORMAL LOW (ref 8.9–10.3)
Chloride: 109 mmol/L (ref 98–111)
Chloride: 106 mmol/L (ref 98–111)
Creatinine, Ser: 1.31 mg/dL — ABNORMAL HIGH (ref 0.44–1.00)
Creatinine, Ser: 1.52 mg/dL — ABNORMAL HIGH (ref 0.44–1.00)
GFR calc Af Amer: 54 mL/min — ABNORMAL LOW (ref >60)
GFR calc Af Amer: 45 mL/min — ABNORMAL LOW (ref >60)
GFR calc non Af Amer: 47 mL/min — ABNORMAL LOW (ref >60)
GFR calc non Af Amer: 39 mL/min — ABNORMAL LOW (ref >60)
Glucose, Bld: 95 mg/dL (ref 70–99)
Glucose, Bld: 123 mg/dL — ABNORMAL HIGH (ref 70–99)
Potassium: 3.5 mmol/L (ref 3.5–5.1)
Potassium: 3 mmol/L — ABNORMAL LOW (ref 3.5–5.1)
Sodium: 137 mmol/L (ref 135–145)
Sodium: 137 mmol/L (ref 135–145)

## 2019-12-13 LAB — MAGNESIUM
Magnesium: 2 mg/dL (ref 1.7–2.4)
Magnesium: 1.9 mg/dL (ref 1.7–2.4)

## 2019-12-13 LAB — ECHOCARDIOGRAM COMPLETE
Height: 66 in
Weight: 3181.68 oz

## 2019-12-13 LAB — PHOSPHORUS: Phosphorus: 2.4 mg/dL — ABNORMAL LOW (ref 2.5–4.6)

## 2019-12-13 MED ORDER — METOPROLOL TARTRATE 25 MG PO TABS
25.0000 mg | ORAL_TABLET | Freq: Two times a day (BID) | ORAL | Status: DC
  Administered 2019-12-13 – 2019-12-14 (×3): 25 mg via ORAL
  Filled 2019-12-13 (×3): qty 1

## 2019-12-13 MED ORDER — CALCIUM CARBONATE 1250 (500 CA) MG PO TABS
1.0000 | ORAL_TABLET | Freq: Three times a day (TID) | ORAL | Status: DC
  Administered 2019-12-13 – 2019-12-14 (×5): 500 mg via ORAL
  Filled 2019-12-13 (×5): qty 1

## 2019-12-13 MED ORDER — PERFLUTREN LIPID MICROSPHERE
1.0000 mL | INTRAVENOUS | Status: AC | PRN
  Administered 2019-12-13: 2 mL via INTRAVENOUS
  Filled 2019-12-13: qty 10

## 2019-12-13 MED ORDER — ALBUMIN HUMAN 5 % IV SOLN
12.5000 g | Freq: Once | INTRAVENOUS | Status: AC
  Administered 2019-12-13: 12.5 g via INTRAVENOUS
  Filled 2019-12-13: qty 250

## 2019-12-13 MED ORDER — CALCIUM GLUCONATE-NACL 2-0.675 GM/100ML-% IV SOLN
2.0000 g | Freq: Once | INTRAVENOUS | Status: AC
  Administered 2019-12-13: 2000 mg via INTRAVENOUS
  Filled 2019-12-13: qty 100

## 2019-12-13 MED ORDER — POTASSIUM CHLORIDE CRYS ER 20 MEQ PO TBCR
40.0000 meq | EXTENDED_RELEASE_TABLET | Freq: Once | ORAL | Status: AC
  Administered 2019-12-13: 40 meq via ORAL
  Filled 2019-12-13: qty 2

## 2019-12-13 NOTE — Progress Notes (Signed)
  Echocardiogram 2D Echocardiogram with definity has been performed.  Darlina Sicilian M 12/13/2019, 2:03 PM

## 2019-12-13 NOTE — Consult Note (Signed)
Date of Admission:  12/10/2019          Reason for Consult: Corynebacteria bacteremia with the patient who has a port  Referring Provider: Dr. Posey Pronto   Assessment:  1. Corynebacteria bacteremia with likely port infection 2. Rule out endocarditis 3. Metastatic cholangiocarcinoma 4.   Plan:  1. Continue vancomycin 2. Discontinue other antibiotics 3. Would consult general surgery for removal of her port 4. Check 2D echocardiogram and may consider transesophageal echocardiogram  Principal Problem:   Pancytopenia due to chemotherapy [facility]) Active Problems:   Nausea & vomiting   Dehydration   AKI (acute kidney injury) ([place])   SIRS (systemic inflammatory response syndrome) (HCC)   Metastatic cholangiocarcinoma to bone (HCC)   History of CVA (cerebrovascular accident)   Hypokalemia   Hypocalcemia   Scheduled Meds: . sodium chloride   Intravenous Once  . calcium carbonate  1 tablet Oral TID WC  . Chlorhexidine Gluconate Cloth  6 each Topical Daily  . feeding supplement (ENSURE ENLIVE)  237 mL Oral TID BM  . phosphorus  500 mg Oral TID  . sodium chloride flush  10-40 mL Intracatheter Q12H  . sodium chloride flush  3 mL Intravenous Once  . sodium chloride flush  3 mL Intravenous Q12H   Continuous Infusions: . lactated ringers 100 mL/hr (12/11/19 1831)  . vancomycin     PRN Meds:.acetaminophen **OR** acetaminophen, LORazepam, ondansetron (ZOFRAN) IV, sodium chloride flush  HPI: Melissa Hurley is a 54 y.o. female past medical history significant for metastatic intrahepatic cholangiocarcinoma on chemotherapy with cisplatin and gemcitabine who was hospitalized in March with acute left MCA stroke and started on Eliquis presented to ER with nausea vomiting poor oral intake and diffuse weakness.  She had blood cultures taken on admission which have subsequent turned positive for corynebacterium JK group from 2 sites.  This is undoubtedly coming from her port which will need to  be removed.  This organism has been found in cases of endocarditis and is associate with the poor outcome.  We will get a 2D echocardiogram and also consider a transesophageal echocardiogram.     Review of Systems: Review of Systems  Constitutional: Positive for chills, fever and malaise/fatigue. Negative for diaphoresis and weight loss.  HENT: Negative for congestion, hearing loss, sore throat and tinnitus.   Eyes: Negative for blurred vision and double vision.  Respiratory: Negative for cough, sputum production, shortness of breath and wheezing.   Cardiovascular: Negative for chest pain, palpitations and leg swelling.  Gastrointestinal: Negative for abdominal pain, blood in stool, constipation, diarrhea, heartburn, melena, nausea and vomiting.  Genitourinary: Negative for dysuria, flank pain and hematuria.  Musculoskeletal: Negative for back pain, falls, joint pain and myalgias.  Skin: Negative for itching and rash.  Neurological: Positive for dizziness. Negative for sensory change, focal weakness, loss of consciousness, weakness and headaches.  Endo/Heme/Allergies: Does not bruise/bleed easily.  Psychiatric/Behavioral: Negative for depression, memory loss and suicidal ideas. The patient is not nervous/anxious.     Past Medical History:  Diagnosis Date  . Cancer ([place])    Liver  . Cholangiocarcinoma ([place])   . Metastasis ([place])   . Pericardial effusion   . Sinus tachycardia   . Uterine fibroid     Social History   Tobacco Use  . Smoking status: Never Smoker  . Smokeless tobacco: Never Used  Substance Use Topics  . Alcohol use: Never  . Drug use: Never    Family History  Problem Relation Age of  Onset  . Breast cancer Mother        3's  . Ovarian cancer Mother   . Lung cancer Mother        54s  . Ovarian cancer Maternal Aunt   . Breast cancer Maternal Grandmother   . Breast cancer Cousin        maternal, dx50+  . Colon cancer Neg Hx   . Esophageal cancer Neg Hx    . Rectal cancer Neg Hx   . Stomach cancer Neg Hx    No Known Allergies  OBJECTIVE: Blood pressure (!) 120/91, pulse (!) 110, temperature 97.6 F (36.4 C), temperature source Oral, resp. rate (!) 26, height 5\' 6"  (1.676 m), weight 90.2 kg, last menstrual period 10/25/2014, SpO2 100 %.  Physical Exam Constitutional:      General: She is not in acute distress.    Appearance: Normal appearance. She is well-developed. She is not ill-appearing or diaphoretic.  HENT:     Head: Normocephalic and atraumatic.     Right Ear: Hearing and external ear normal.     Left Ear: Hearing and external ear normal.     Nose: No nasal deformity or rhinorrhea.  Eyes:     General: No scleral icterus.    Conjunctiva/sclera: Conjunctivae normal.     Right eye: Right conjunctiva is not injected.     Left eye: Left conjunctiva is not injected.     Pupils: Pupils are equal, round, and reactive to light.  Neck:     Vascular: No JVD.  Cardiovascular:     Rate and Rhythm: Normal rate and regular rhythm.     Heart sounds: S1 normal and S2 normal.  Pulmonary:     Effort: Pulmonary effort is normal. No respiratory distress.     Breath sounds: No wheezing.  Abdominal:     General: There is no distension.     Palpations: Abdomen is soft.     Tenderness: There is no abdominal tenderness.  Musculoskeletal:        General: Normal range of motion.     Right shoulder: Normal.     Left shoulder: Normal.     Cervical back: Normal range of motion and neck supple.     Right hip: Normal.     Left hip: Normal.     Right knee: Normal.     Left knee: Normal.  Lymphadenopathy:     Head:     Right side of head: No submandibular, preauricular or posterior auricular adenopathy.     Left side of head: No submandibular, preauricular or posterior auricular adenopathy.     Cervical: No cervical adenopathy.     Right cervical: No superficial or deep cervical adenopathy.    Left cervical: No superficial or deep cervical  adenopathy.  Skin:    General: Skin is warm and dry.     Coloration: Skin is not pale.     Findings: No abrasion, bruising, ecchymosis, erythema, lesion or rash.     Nails: There is no clubbing.  Neurological:     Mental Status: She is alert and oriented to person, place, and time.     Sensory: No sensory deficit.     Coordination: Coordination normal.     Gait: Gait normal.  Psychiatric:        Attention and Perception: Attention and perception normal. She is attentive.        Mood and Affect: Mood is depressed.        Speech:  Speech normal.        Behavior: Behavior normal. Behavior is cooperative.        Thought Content: Thought content normal.        Cognition and Memory: Cognition and memory normal.        Judgment: Judgment normal.    Port 12/13/2019:      Lab Results Lab Results  Component Value Date   WBC 1.8 (L) 12/13/2019   HGB 8.9 (L) 12/13/2019   HCT 28.1 (L) 12/13/2019   MCV 82.4 12/13/2019   PLT 50 (L) 12/13/2019    Lab Results  Component Value Date   CREATININE 1.31 (H) 12/13/2019   BUN 21 (H) 12/13/2019   NA 137 12/13/2019   K 3.5 12/13/2019   CL 109 12/13/2019   CO2 20 (L) 12/13/2019    Lab Results  Component Value Date   ALT 15 12/11/2019   AST 26 12/11/2019   ALKPHOS 91 12/11/2019   BILITOT 1.1 12/11/2019     Microbiology: Recent Results (from the past 240 hour(s))  Blood culture (routine x 2)     Status: Abnormal   Collection Time: 12/10/19  3:28 PM   Specimen: BLOOD  Result Value Ref Range Status   Specimen Description BLOOD SITE NOT SPECIFIED  Final   Special Requests   Final    BOTTLES DRAWN AEROBIC AND ANAEROBIC Blood Culture adequate volume   Culture  Setup Time   Final    GRAM POSITIVE RODS AEROBIC BOTTLE ONLY CRITICAL RESULT CALLED TO, READ BACK BY AND VERIFIED WITH: L. CURRAN,PHARMD 2127 12/11/2019 T. TYSOR    Culture (A)  Final    DIPHTHEROIDS(CORYNEBACTERIUM SPECIES) Standardized susceptibility testing for this organism  is not available. Performed at [facility], [place] [address], [place], [place] [address]    Report Status 12/12/2019 FINAL  Final  Respiratory Panel by RT PCR (Flu A&B, Covid) - Nasopharyngeal Swab     Status: None   Collection Time: 12/10/19  6:03 PM   Specimen: Nasopharyngeal Swab  Result Value Ref Range Status   SARS Coronavirus 2 by RT PCR NEGATIVE NEGATIVE Final    Comment: (NOTE) SARS-CoV-2 target nucleic acids are NOT DETECTED. The SARS-CoV-2 RNA is generally detectable in upper respiratoy specimens during the acute phase of infection. The lowest concentration of SARS-CoV-2 viral copies this assay can detect is 131 copies/mL. A negative result does not preclude SARS-Cov-2 infection and should not be used as the sole basis for treatment or other patient management decisions. A negative result may occur with  improper specimen collection/handling, submission of specimen other than nasopharyngeal swab, presence of viral mutation(s) within the areas targeted by this assay, and inadequate number of viral copies (<131 copies/mL). A negative result must be combined with clinical observations, patient history, and epidemiological information. The expected result is Negative. Fact Sheet for Patients:  PinkCheek.be Fact Sheet for Healthcare Providers:  GravelBags.it This test is not yet ap proved or cleared by the [place] FDA and  has been authorized for detection and/or diagnosis of SARS-CoV-2 by FDA under an Emergency Use Authorization (EUA). This EUA will remain  in effect (meaning this test can be used) for the duration of the COVID-19 declaration under Section 564(b)(1) of the Act, 21 U.S.C. section 360bbb-3(b)(1), unless the authorization is terminated or revoked sooner.    Influenza A by PCR NEGATIVE NEGATIVE Final   Influenza B by PCR NEGATIVE NEGATIVE Final    Comment: (NOTE) The Xpert Xpress  SARS-CoV-2/FLU/RSV  assay is intended as an aid in  the diagnosis of influenza from Nasopharyngeal swab specimens and  should not be used as a sole basis for treatment. Nasal washings and  aspirates are unacceptable for Xpert Xpress SARS-CoV-2/FLU/RSV  testing. Fact Sheet for Patients: PinkCheek.be Fact Sheet for Healthcare Providers: GravelBags.it This test is not yet approved or cleared by the [place] FDA and  has been authorized for detection and/or diagnosis of SARS-CoV-2 by  FDA under an Emergency Use Authorization (EUA). This EUA will remain  in effect (meaning this test can be used) for the duration of the  Covid-19 declaration under Section 564(b)(1) of the Act, 21  U.S.C. section 360bbb-3(b)(1), unless the authorization is  terminated or revoked. Performed at [facility], [place] [address] [place], [place] [address]   Blood culture (routine x 2)     Status: Abnormal   Collection Time: 12/10/19  8:47 PM   Specimen: BLOOD  Result Value Ref Range Status   Specimen Description BLOOD SITE NOT SPECIFIED  Final   Special Requests   Final    BOTTLES DRAWN AEROBIC AND ANAEROBIC Blood Culture results may not be optimal due to an inadequate volume of blood received in culture bottles   Culture  Setup Time   Final    GRAM POSITIVE RODS AEROBIC BOTTLE ONLY CRITICAL RESULT CALLED TO, READ BACK BY AND VERIFIED WITH: L. CURRAN,PHARMD 2127 12/11/2019 T. TYSOR    Culture (A)  Final    CORYNEBACTERIUM, GROUP JK Standardized susceptibility testing for this organism is not available. Performed at [facility], [place] [address], [place], [place] [address]    Report Status 12/12/2019 FINAL  Final  C Difficile Quick Screen w PCR reflex     Status: None   Collection Time: 12/11/19 10:12 AM   Specimen: STOOL  Result Value Ref Range Status   C Diff antigen NEGATIVE NEGATIVE Final   C Diff toxin NEGATIVE NEGATIVE Final   C  Diff interpretation No C. difficile detected.  Final    Comment: Performed at [facility], [place] [address], [place], [place] [address]  Culture, blood (x 2)     Status: None (Preliminary result)   Collection Time: 12/12/19  7:39 AM   Specimen: BLOOD  Result Value Ref Range Status   Specimen Description BLOOD SITE NOT SPECIFIED  Final   Special Requests   Final    BOTTLES DRAWN AEROBIC ONLY Blood Culture results may not be optimal due to an inadequate volume of blood received in culture bottles   Culture   Final    NO GROWTH < 24 HOURS Performed at [facility], [place] [address] [place], [place] [address]    Report Status PENDING  Incomplete  Culture, blood (x 2)     Status: None (Preliminary result)   Collection Time: 12/12/19  7:53 AM   Specimen: BLOOD  Result Value Ref Range Status   Specimen Description BLOOD SITE NOT SPECIFIED  Final   Special Requests   Final    BOTTLES DRAWN AEROBIC ONLY Blood Culture results may not be optimal due to an inadequate volume of blood received in culture bottles   Culture   Final    NO GROWTH < 24 HOURS Performed at [facility], [place] [address], [place], [place] [address]    Report Status PENDING  Incomplete    Alcide Evener, [place] for [facility] [phone]  pager  12/13/2019, 11:57 AM

## 2019-12-13 NOTE — Progress Notes (Addendum)
  14:27 Received a call from central telemetry, stating that patient had an episode of 15 beats of SVT. MEWS is red now.Dr. Posey Pronto made aware and ordered lopressor 25mg .    16:27 Heart Rate now in 80s.  MEWS is now 1 Nyoka Cowden) .BP 128/85   Pulse 89   Temp 97.9 F (36.6 C) (Oral)   Resp (!) 22   Ht 5\' 6"  (1.676 m)   Wt 90.2 kg   LMP 10/25/2014   SpO2 100%   BMI 32.10 kg/m

## 2019-12-13 NOTE — Progress Notes (Signed)
[facility] Progress Note  Patient: Melissa Hurley    [id number]  DOA: 12/10/2019     Date of Service: the patient was seen and examined on 12/13/2019  Chief Complaint  Patient presents with  . Nausea  . Weakness  . Emesis   Brief hospital course: Past medical history of, metastatic intrahepatic cholangiocarcinoma, followed at [facility] and currently undergoing tx cisplatin/gemcitabine, who was recently hospitalized in 10/2019 due to acute left MCA CVA and started on eliquis presents with nausea, vomiting, poor p.o. intake.  Found to have hypokalemia, hypomagnesemia, hypophosphatemia as well as pancytopenia with undetectable platelets.  Currently further plan is monitor blood counts and supportive measures.  Assessment and Plan: 1.  Stage IV metastatic intrahepatic cholangiocarcinoma. Pancytopenia due to chemotherapy. Present generalized fatigue found to have hemoglobin 6.5, platelets undetectable, neutropenia. EDP discussed with hematology on-call Dr. Irene Limbo who recommended supportive measures with transfusion. Patient has received 2 PRBC and 1 platelet transfusion. Platelets improved, hemoglobin remained stable. No active bleeding.  2.  Febrile neutropenia. Corynebacterium bacteremia SIRS POA Overnight temperature 100.7 lasted for at least an hour. Currently patient does not have any acute symptoms other than diarrhea. Chest x-ray unremarkable. Blood cultures currently growing 2 out of 4 Corynebacterium.  Currently on IV vancomycin and meropenem. Appreciate ID consultation. Now only on vancomycin.  ID recommending echocardiogram as well as Port-A-Cath removal. COVID-19 negative.  3.  Diarrhea. Ileus likely from electrolytes imbalance. Suspect secondary to chemotherapy although chronology is not appropriate for chemotherapy-induced diarrhea. Currently I will rule out C. difficile. Continue with IV hydration. Patient currently on IV cefepime.  4.   Multiple electrolyte imbalance, hypokalemia, hypomagnesemia, hypophosphatemia, hypocalcemia Acute kidney injury Treated with IV fluid.  Renal function worsening again.  Will provide albumin. Currently being replaced. Monitor response.  5.  Nausea vomiting and poor p.o. intake In the setting of recent chemotherapy. Monitor.  Nausea currently resolved.  No vomiting in the hospital.  6.  Recent left MCA CVA Started on Eliquis. In the setting of thrombocytopenia currently holding all medications.  7.  Bilateral lower extremity purpura.  Monitor.  Currently stable  8.  Port-A-Cath in place. Currently accessed flushing well but unable to draw labs. Continue for now as patient has poor vascular access due to severe dehydration. Can use foot stick for blood draw.  Body mass index is 32.1 kg/m.  Nutrition Problem: Increased nutrient needs Etiology: cancer and cancer related treatments Interventions: Interventions: Ensure Enlive (each supplement provides 350kcal and 20 grams of protein)  Diet: regular diet DVT Prophylaxis: SCD, pharmacological prophylaxis contraindicated due to Severe thrombocytopenia   Advance goals of care discussion: Full code  Family Communication: family was present at bedside, at the time of interview.  The pt provided permission to discuss medical plan with the family. Opportunity was given to ask question and all questions were answered satisfactorily.   Disposition:  Status is: Inpatient  Remains inpatient appropriate because:Hemodynamically unstable, Persistent severe electrolyte disturbances and Ongoing diagnostic testing needed not appropriate for outpatient work up   Dispo: The patient is from: Home              Anticipated d/c is to: Home              Anticipated d/c date is: 3 days              Patient currently is not medically stable to d/c.   Subjective: No nausea no vomiting.  No fever no chills.  No abdominal pain.  No chest  pain.  Physical Exam: General: Appear in mild distress, petechial rash; Oral Mucosa Clear, moist. no Abnormal Neck Mass Or lumps, Conjunctiva normal  Cardiovascular: S1 and S2 Present, no Murmur, Respiratory: normal respiratory effort, Bilateral Air entry present and Clear to Auscultation, no Crackles, no wheezes Abdomen: Bowel Sound present, Soft and no tenderness, no hernia Extremities: no Pedal edema, no calf tenderness Neurology: alert and oriented to time, place, and person affect appropriate.  Vitals:   12/13/19 1212 12/13/19 1237 12/13/19 1450 12/13/19 1621  BP: 112/68   128/85  Pulse: 100 (!) 102 (!) 109 89  Resp: 17 (!) 23 (!) 22   Temp: 97.8 F (36.6 C)   97.9 F (36.6 C)  TempSrc: Oral   Oral  SpO2: 100% 100% 100% 100%  Weight:      Height:        Intake/Output Summary (Last 24 hours) at 12/13/2019 1847 Last data filed at 12/13/2019 0912 Gross per 24 hour  Intake 222 ml  Output --  Net 222 ml   Filed Weights   12/10/19 1430 12/10/19 2222  Weight: 93 kg 90.2 kg    Data Reviewed: I have personally reviewed and interpreted daily labs, tele strips, imagings as discussed above. I reviewed all nursing notes, pharmacy notes, vitals, pertinent old records I have discussed plan of care as described above with RN and patient/family.  CBC: Recent Labs  Lab 12/10/19 1530 12/10/19 1530 12/10/19 1711 12/10/19 1808 12/11/19 1302 12/12/19 0739 12/13/19 0322  WBC 1.4*  --  1.4*  --  1.5* 1.3* 1.8*  NEUTROABS  --   --  0.7*  --  0.5*  --  0.2*  HGB 6.5*   < > 6.4* 5.8* 8.7* 8.4* 8.9*  HCT 20.6*   < > 20.0* 17.0* 26.5* 25.7* 28.1*  MCV 83.7  --  81.3  --  81.3 82.6 82.4  PLT <5*  --  <5*  --  26* 28* 50*   < > = values in this interval not displayed.   Basic Metabolic Panel: Recent Labs  Lab 12/10/19 1711 12/10/19 1808 12/11/19 1302 12/12/19 0739 12/12/19 1555 12/13/19 0322 12/13/19 1555  NA  --    < > 136 137 137 137 137  K  --    < > 3.2* 3.1* 3.1* 3.5  3.0*  CL  --    < > 106 109 109 109 106  CO2  --    < > 21* 17* 17* 20* 18*  GLUCOSE  --    < > 143* 90 122* 95 123*  BUN  --    < > 21* 19 22* 21* 23*  CREATININE  --    < > 1.56* 1.29* 1.43* 1.31* 1.52*  CALCIUM  --    < > 5.7* 5.6* 5.9* 6.0* 6.6*  MG 0.9*   < > 1.2* 1.6* 2.2 2.0 1.9  PHOS <1.0*  --  <1.0* 2.0*  --  2.4*  --    < > = values in this interval not displayed.    Studies: No results found.  Scheduled Meds: . sodium chloride   Intravenous Once  . calcium carbonate  1 tablet Oral TID WC  . Chlorhexidine Gluconate Cloth  6 each Topical Daily  . feeding supplement (ENSURE ENLIVE)  237 mL Oral TID BM  . metoprolol tartrate  25 mg Oral BID  . phosphorus  500 mg Oral TID  . sodium chloride flush  10-40 mL Intracatheter Q12H  . sodium chloride flush  3 mL Intravenous Once  . sodium chloride flush  3 mL Intravenous Q12H   Continuous Infusions: . lactated ringers 100 mL/hr (12/11/19 1831)  . vancomycin 1,000 mg (12/13/19 1230)   PRN Meds: acetaminophen **OR** acetaminophen, LORazepam, ondansetron (ZOFRAN) IV, sodium chloride flush  Time spent: 35 minutes  Author: Berle Mull, MD [facility] 12/13/2019 6:47 PM  To reach On-call, see care teams to locate the attending and reach out to them via [email or web address]. If 7PM-7AM, please contact night-coverage If you still have difficulty reaching the attending provider, please page the [facility] (Director on Call) for [facility] on amion for assistance.

## 2019-12-14 ENCOUNTER — Encounter (HOSPITAL_COMMUNITY): Payer: Self-pay | Admitting: Internal Medicine

## 2019-12-14 DIAGNOSIS — N179 Acute kidney failure, unspecified: Secondary | ICD-10-CM

## 2019-12-14 DIAGNOSIS — T827XXA Infection and inflammatory reaction due to other cardiac and vascular devices, implants and grafts, initial encounter: Secondary | ICD-10-CM

## 2019-12-14 DIAGNOSIS — I313 Pericardial effusion (noninflammatory): Secondary | ICD-10-CM

## 2019-12-14 DIAGNOSIS — E876 Hypokalemia: Secondary | ICD-10-CM

## 2019-12-14 LAB — GASTROINTESTINAL PANEL BY PCR, STOOL (REPLACES STOOL CULTURE)

## 2019-12-14 LAB — BASIC METABOLIC PANEL
Anion gap: 8 (ref 5–15)
BUN: 21 mg/dL — ABNORMAL HIGH (ref 6–20)
CO2: 22 mmol/L (ref 22–32)
Calcium: 6.5 mg/dL — ABNORMAL LOW (ref 8.9–10.3)
Chloride: 109 mmol/L (ref 98–111)
Creatinine, Ser: 1.17 mg/dL — ABNORMAL HIGH (ref 0.44–1.00)
GFR calc Af Amer: >60 mL/min (ref >60)
GFR calc non Af Amer: 54 mL/min — ABNORMAL LOW (ref >60)
Glucose, Bld: 89 mg/dL (ref 70–99)
Potassium: 3.4 mmol/L — ABNORMAL LOW (ref 3.5–5.1)
Sodium: 139 mmol/L (ref 135–145)

## 2019-12-14 LAB — CBC WITH DIFFERENTIAL/PLATELET
Abs Immature Granulocytes: 0.06 10*3/uL (ref 0.00–0.07)
Basophils Absolute: 0 10*3/uL (ref 0.0–0.1)
Basophils Relative: 0 %
Eosinophils Absolute: 0 10*3/uL (ref 0.0–0.5)
Eosinophils Relative: 0 %
HCT: 27.4 % — ABNORMAL LOW (ref 36.0–46.0)
Hemoglobin: 8.8 g/dL — ABNORMAL LOW (ref 12.0–15.0)
Immature Granulocytes: 2 %
Lymphocytes Relative: 48 %
Lymphs Abs: 1.3 10*3/uL (ref 0.7–4.0)
MCH: 26 pg (ref 26.0–34.0)
MCHC: 32.1 g/dL (ref 30.0–36.0)
MCV: 80.8 fL (ref 80.0–100.0)
Monocytes Absolute: 1 10*3/uL (ref 0.1–1.0)
Monocytes Relative: 39 %
Neutro Abs: 0.3 10*3/uL — ABNORMAL LOW (ref 1.7–7.7)
Neutrophils Relative %: 11 %
Platelets: 71 10*3/uL — ABNORMAL LOW (ref 150–400)
RBC: 3.39 MIL/uL — ABNORMAL LOW (ref 3.87–5.11)
RDW: 18.8 % — ABNORMAL HIGH (ref 11.5–15.5)
WBC: 2.7 10*3/uL — ABNORMAL LOW (ref 4.0–10.5)
nRBC: 1.1 % — ABNORMAL HIGH (ref 0.0–0.2)

## 2019-12-14 LAB — PHOSPHORUS: Phosphorus: 2.8 mg/dL (ref 2.5–4.6)

## 2019-12-14 LAB — MAGNESIUM: Magnesium: 1.8 mg/dL (ref 1.7–2.4)

## 2019-12-14 MED ORDER — METOPROLOL TARTRATE 5 MG/5ML IV SOLN: 2.5000 mg | INTRAVENOUS | Status: DC | PRN

## 2019-12-14 NOTE — Progress Notes (Signed)
New orders placed from Erlinda Hong, MD. Pt was choking earlier from her meds and had thick secretions.

## 2019-12-14 NOTE — Progress Notes (Addendum)
Subjective:  4 was no longer access and some fluid was getting which Melissa Hurley attended to   Antibiotics:  Anti-infectives (From admission, onward)   Start     Dose/Rate Route Frequency Ordered Stop   12/13/19 1100  vancomycin (VANCOCIN) IVPB 1000 mg/200 mL premix     1,000 mg 200 mL/hr over 60 Minutes Intravenous Every 24 hours 12/12/19 0952     12/13/19 0000  vancomycin (VANCOCIN) IVPB 1000 mg/200 mL premix  Status:  Discontinued     1,000 mg 200 mL/hr over 60 Minutes Intravenous Every 24 hours 12/12/19 0948 12/12/19 0952   12/12/19 1400  piperacillin-tazobactam (ZOSYN) IVPB 3.375 g  Status:  Discontinued     3.375 g 12.5 mL/hr over 240 Minutes Intravenous Every 8 hours 12/12/19 0754 12/12/19 0946   12/12/19 1100  vancomycin (VANCOREADY) IVPB 1750 mg/350 mL     1,750 mg 175 mL/hr over 120 Minutes Intravenous  Once 12/12/19 0951 12/12/19 1453   12/12/19 1100  meropenem (MERREM) 1 g in sodium chloride 0.9 % 100 mL IVPB  Status:  Discontinued     1 g 200 mL/hr over 30 Minutes Intravenous Every 8 hours 12/12/19 0952 12/13/19 0858   12/12/19 1000  vancomycin (VANCOREADY) IVPB 1750 mg/350 mL  Status:  Discontinued     1,750 mg 175 mL/hr over 120 Minutes Intravenous  Once 12/12/19 0948 12/12/19 0951   12/12/19 1000  meropenem (MERREM) 1 g in sodium chloride 0.9 % 100 mL IVPB  Status:  Discontinued     1 g 200 mL/hr over 30 Minutes Intravenous Every 8 hours 12/12/19 0948 12/12/19 0952   12/12/19 0745  piperacillin-tazobactam (ZOSYN) IVPB 3.375 g     3.375 g 100 mL/hr over 30 Minutes Intravenous  Once 12/12/19 0732 12/12/19 0918   12/12/19 0100  ceFEPIme (MAXIPIME) 2 g in sodium chloride 0.9 % 100 mL IVPB  Status:  Discontinued     2 g 200 mL/hr over 30 Minutes Intravenous Every 12 hours 12/11/19 1218 12/12/19 0732   12/11/19 1100  ceFEPIme (MAXIPIME) 2 g in sodium chloride 0.9 % 100 mL IVPB     2 g 200 mL/hr over 30 Minutes Intravenous NOW 12/11/19 1029 12/11/19  1230      Medications: Scheduled Meds: . sodium chloride   Intravenous Once  . calcium carbonate  1 tablet Oral TID WC  . Chlorhexidine Gluconate Cloth  6 each Topical Daily  . feeding supplement (ENSURE ENLIVE)  237 mL Oral TID BM  . metoprolol tartrate  25 mg Oral BID  . phosphorus  500 mg Oral TID  . sodium chloride flush  10-40 mL Intracatheter Q12H  . sodium chloride flush  3 mL Intravenous Once  . sodium chloride flush  3 mL Intravenous Q12H   Continuous Infusions: . lactated ringers 100 mL/hr (12/11/19 1831)  . vancomycin 1,000 mg (12/13/19 1230)   PRN Meds:.acetaminophen **OR** acetaminophen, LORazepam, ondansetron (ZOFRAN) IV, sodium chloride flush    Objective: Weight change:  No intake or output data in the 24 hours ending 12/14/19 1211 Blood pressure (!) 115/53, pulse 77, temperature 98.1 F (36.7 C), temperature source Oral, resp. rate 20, height 5\' 6"  (1.676 m), weight 90.2 kg, last menstrual period 10/25/2014, SpO2 100 %. Temp:  [97.8 F (36.6 C)-98.8 F (37.1 C)] 98.1 F (36.7 C) (04/27 0747) Pulse Rate:  [71-109] 77 (04/27 1200) Resp:  [17-24] 20 (04/27 1200) BP: (95-131)/(39-85) 115/53 (04/27 1200) SpO2:  [99 %-100 %]  100 % (04/27 1200)  Physical Exam: General: Alert and awake, oriented x3, not in any acute distress. HEENT: anicteric sclera, EOMI CVS regular rate, normal no murmurs gallops rubs heard Chest: , no wheezing, no respiratory distress Abdomen: soft non-distended,  Extremities: no edema or deformity noted bilaterally Skin: no rashes Neuro: nonfocal Port still in place and with some bruising CBC:    BMET Recent Labs    12/13/19 1555 12/14/19 0331  NA 137 139  K 3.0* 3.4*  CL 106 109  CO2 18* 22  GLUCOSE 123* 89  BUN 23* 21*  CREATININE 1.52* 1.17*  CALCIUM 6.6* 6.5*     Liver Panel  Recent Labs    12/11/19 1302  PROT 4.9*  ALBUMIN 2.2*  AST 26  ALT 15  ALKPHOS 91  BILITOT 1.1       Sedimentation Rate No  results for input(s): ESRSEDRATE in the last 72 hours. C-Reactive Protein No results for input(s): CRP in the last 72 hours.  Micro Results: Recent Results (from the past 720 hour(s))  Blood culture (routine x 2)     Status: Abnormal   Collection Time: 12/10/19  3:28 PM   Specimen: BLOOD  Result Value Ref Range Status   Specimen Description BLOOD SITE NOT SPECIFIED  Final   Special Requests   Final    BOTTLES DRAWN AEROBIC AND ANAEROBIC Blood Culture adequate volume   Culture  Setup Time   Final    GRAM POSITIVE RODS AEROBIC BOTTLE ONLY CRITICAL RESULT CALLED TO, READ BACK BY AND VERIFIED WITH: L. CURRAN,PHARMD 2127 12/11/2019 T. TYSOR    Culture (A)  Final    DIPHTHEROIDS(CORYNEBACTERIUM SPECIES) Standardized susceptibility testing for this organism is not available. Performed at [facility], [place] [address] [place], [place] [address]    Report Status 12/12/2019 FINAL  Final  Respiratory Panel by RT PCR (Flu A&B, Covid) - Nasopharyngeal Swab     Status: None   Collection Time: 12/10/19  6:03 PM   Specimen: Nasopharyngeal Swab  Result Value Ref Range Status   SARS Coronavirus 2 by RT PCR NEGATIVE NEGATIVE Final    Comment: (NOTE) SARS-CoV-2 target nucleic acids are NOT DETECTED. The SARS-CoV-2 RNA is generally detectable in upper respiratoy specimens during the acute phase of infection. The lowest concentration of SARS-CoV-2 viral copies this assay can detect is 131 copies/mL. A negative result does not preclude SARS-Cov-2 infection and should not be used as the sole basis for treatment or other patient management decisions. A negative result may occur with  improper specimen collection/handling, submission of specimen other than nasopharyngeal swab, presence of viral mutation(s) within the areas targeted by this assay, and inadequate number of viral copies (<131 copies/mL). A negative result must be combined with clinical observations, patient history, and  epidemiological information. The expected result is Negative. Fact Sheet for Patients:  PinkCheek.be Fact Sheet for Healthcare Providers:  GravelBags.it This test is not yet ap proved or cleared by the [place] FDA and  has been authorized for detection and/or diagnosis of SARS-CoV-2 by FDA under an Emergency Use Authorization (EUA). This EUA will remain  in effect (meaning this test can be used) for the duration of the COVID-19 declaration under Section 564(b)(1) of the Act, 21 U.S.C. section 360bbb-3(b)(1), unless the authorization is terminated or revoked sooner.    Influenza A by PCR NEGATIVE NEGATIVE Final   Influenza B by PCR NEGATIVE NEGATIVE Final    Comment: (NOTE) The Xpert Xpress SARS-CoV-2/FLU/RSV assay  is intended as an aid in  the diagnosis of influenza from Nasopharyngeal swab specimens and  should not be used as a sole basis for treatment. Nasal washings and  aspirates are unacceptable for Xpert Xpress SARS-CoV-2/FLU/RSV  testing. Fact Sheet for Patients: PinkCheek.be Fact Sheet for Healthcare Providers: GravelBags.it This test is not yet approved or cleared by the [place] FDA and  has been authorized for detection and/or diagnosis of SARS-CoV-2 by  FDA under an Emergency Use Authorization (EUA). This EUA will remain  in effect (meaning this test can be used) for the duration of the  Covid-19 declaration under Section 564(b)(1) of the Act, 21  U.S.C. section 360bbb-3(b)(1), unless the authorization is  terminated or revoked. Performed at [facility], [place] [address] [place], [place] [address]   Blood culture (routine x 2)     Status: Abnormal   Collection Time: 12/10/19  8:47 PM   Specimen: BLOOD  Result Value Ref Range Status   Specimen Description BLOOD SITE NOT SPECIFIED  Final   Special Requests   Final    BOTTLES DRAWN  AEROBIC AND ANAEROBIC Blood Culture results may not be optimal due to an inadequate volume of blood received in culture bottles   Culture  Setup Time   Final    GRAM POSITIVE RODS AEROBIC BOTTLE ONLY CRITICAL RESULT CALLED TO, READ BACK BY AND VERIFIED WITH: L. CURRAN,PHARMD 2127 12/11/2019 T. TYSOR    Culture (A)  Final    CORYNEBACTERIUM, GROUP JK Standardized susceptibility testing for this organism is not available. Performed at [facility], [place] [address] [place], [place] [address]    Report Status 12/12/2019 FINAL  Final  C Difficile Quick Screen w PCR reflex     Status: None   Collection Time: 12/11/19 10:12 AM   Specimen: STOOL  Result Value Ref Range Status   C Diff antigen NEGATIVE NEGATIVE Final   C Diff toxin NEGATIVE NEGATIVE Final   C Diff interpretation No C. difficile detected.  Final    Comment: Performed at [facility], [place] [address], [place], [place] [address]  Culture, blood (x 2)     Status: None (Preliminary result)   Collection Time: 12/12/19  7:39 AM   Specimen: BLOOD  Result Value Ref Range Status   Specimen Description BLOOD SITE NOT SPECIFIED  Final   Special Requests   Final    BOTTLES DRAWN AEROBIC ONLY Blood Culture results may not be optimal due to an inadequate volume of blood received in culture bottles   Culture  Setup Time   Final    AEROBIC BOTTLE ONLY GRAM POSITIVE RODS CRITICAL VALUE NOTED.  VALUE IS CONSISTENT WITH PREVIOUSLY REPORTED AND CALLED VALUE.    Culture   Final    CULTURE REINCUBATED FOR BETTER GROWTH Performed at [facility], [place] [address], [place], [place] [address]    Report Status PENDING  Incomplete  Culture, blood (x 2)     Status: None (Preliminary result)   Collection Time: 12/12/19  7:53 AM   Specimen: BLOOD  Result Value Ref Range Status   Specimen Description BLOOD SITE NOT SPECIFIED  Final   Special Requests   Final    BOTTLES DRAWN AEROBIC ONLY Blood Culture results may not be optimal due  to an inadequate volume of blood received in culture bottles   Culture  Setup Time   Final    AEROBIC BOTTLE ONLY GRAM POSITIVE RODS CRITICAL VALUE NOTED.  VALUE IS CONSISTENT WITH PREVIOUSLY REPORTED AND CALLED VALUE.    Culture   Final    CULTURE REINCUBATED FOR BETTER GROWTH Performed at [facility], [place] [address] [place], [place] [address]    Report Status PENDING  Incomplete    Studies/Results: ECHOCARDIOGRAM COMPLETE  Result Date: 12/13/2019    ECHOCARDIOGRAM REPORT   Patient Name:   Melissa Hurley Date of Exam: 12/13/2019 Medical Rec #:  [id number]       Height:       66.0 in Accession #:    [id number]      Weight:       198.9 lb Date of Birth:  [birth year]      BSA:          1.995 m Patient Age:    15 years        BP:           112/68 mmHg Patient Gender: F               HR:           100 bpm. Exam Location:  Inpatient Procedure: 2D Echo and Intracardiac Opacification Agent Indications:    Corynebacteria Bacteremia 790.7 / R78.81  History:        Patient has prior history of Echocardiogram examinations, most                 recent 10/26/2019. Stroke. Metastatic intrahepatic                 cholangiocarcinoma, Pericardial effusion, Sinus Tachycardia.  Sonographer:    Darlina Sicilian RDCS Referring Phys: [id number] Salem Mastrogiovanni N VAN DAM  Sonographer Comments: Suboptimal apical window. IMPRESSIONS  1. Left ventricular ejection fraction, by estimation, is 55 to 60%. The left ventricle has normal function. The left ventricle has no regional wall motion abnormalities. There is mild left ventricular hypertrophy. Indeterminate diastolic filling due to E-A fusion.  2. Right ventricular systolic function is normal. The right ventricular size is normal. Tricuspid regurgitation signal is inadequate for assessing PA pressure.  3. Left atrial size was mildly dilated.  4. Moderate to large pericardial effusion. The pericardial effusion is anterior to the right ventricle and surrounding the apex. There is no  evidence of cardiac tamponade. No RV diastolic collapse, IVC appears small and collapsible. Respirometer evaluation not performed.  5. The mitral valve is normal in structure. No evidence of mitral valve regurgitation. No evidence of mitral stenosis.  6. The aortic valve is tricuspid. Aortic valve regurgitation is mild. No aortic stenosis is present.  7. The inferior vena cava is normal in size with greater than 50% respiratory variability, suggesting right atrial pressure of 3 mmHg. Conclusion(s)/Recommendation(s): No evidence of valvular vegetations on this transthoracic echocardiogram. Would recommend a transesophageal echocardiogram to exclude infective endocarditis if clinically indicated. FINDINGS  Left Ventricle: Left ventricular ejection fraction, by estimation, is 55 to 60%. The left ventricle has normal function. The left ventricle has no regional wall motion abnormalities. Definity contrast agent was given IV to delineate the left ventricular  endocardial borders. The left ventricular internal cavity size was normal in size. There is mild left ventricular hypertrophy. Indeterminate diastolic filling due to E-A fusion. Right Ventricle: The right ventricular size is normal. No increase in right ventricular wall thickness. Right ventricular systolic function is normal. Tricuspid regurgitation signal is inadequate for assessing PA pressure. Left Atrium: Left atrial size was mildly dilated. Right Atrium: Right atrial size was normal in size.  Pericardium: Moderate to large pericardial effusion. The pericardial effusion is anterior to the right ventricle and surrounding the apex. There is no evidence of cardiac tamponade. Mitral Valve: The mitral valve is normal in structure. Normal mobility of the mitral valve leaflets. No evidence of mitral valve regurgitation. No evidence of mitral valve stenosis. Tricuspid Valve: The tricuspid valve is normal in structure. Tricuspid valve regurgitation is not demonstrated.  No evidence of tricuspid stenosis. Aortic Valve: The aortic valve is tricuspid. Aortic valve regurgitation is mild. No aortic stenosis is present. Pulmonic Valve: The pulmonic valve was normal in structure. Pulmonic valve regurgitation is not visualized. No evidence of pulmonic stenosis. Aorta: The aortic root is normal in size and structure. Venous: The inferior vena cava is normal in size with greater than 50% respiratory variability, suggesting right atrial pressure of 3 mmHg. IAS/Shunts: No atrial level shunt detected by color flow Doppler.  LEFT VENTRICLE PLAX 2D LVIDd:         3.40 cm     Diastology LVIDs:         2.40 cm     LV e' lateral:   4.46 cm/s LV PW:         1.00 cm     LV E/e' lateral: 12.5 LV IVS:        1.10 cm     LV e' medial:    5.22 cm/s LVOT diam:     2.00 cm     LV E/e' medial:  10.7 LV SV:         34 LV SV Index:   17 LVOT Area:     3.14 cm  LV Volumes (MOD) LV vol d, MOD A2C: 78.6 ml LV vol d, MOD A4C: 93.2 ml LV vol s, MOD A2C: 38.6 ml LV vol s, MOD A4C: 50.6 ml LV SV MOD A2C:     40.0 ml LV SV MOD A4C:     93.2 ml LV SV MOD BP:      40.3 ml RIGHT VENTRICLE RV S prime:     27.60 cm/s LEFT ATRIUM           Index       RIGHT ATRIUM          Index LA diam:      2.60 cm 1.30 cm/m  RA Area:     9.78 cm LA Vol (A2C): 28.5 ml 14.29 ml/m RA Volume:   17.25 ml 8.65 ml/m LA Vol (A4C): 74.1 ml 37.14 ml/m  AORTIC VALVE LVOT Vmax:   75.00 cm/s LVOT Vmean:  54.700 cm/s LVOT VTI:    0.108 m  AORTA Ao Root diam: 3.00 cm MITRAL VALVE MV Area (PHT): 6.43 cm    SHUNTS MV Decel Time: 118 msec    Systemic VTI:  0.11 m MV E velocity: 55.60 cm/s  Systemic Diam: 2.00 cm MV A velocity: 98.20 cm/s MV E/A ratio:  0.57 Cherlynn Kaiser MD Electronically signed by Cherlynn Kaiser MD Signature Date/Time: 12/13/2019/7:29:39 PM    Final       Assessment/Plan:  INTERVAL HISTORY: TTE is negative but pericardial effusion   Principal Problem:   Pancytopenia due to chemotherapy [facility]) Active Problems:    Nausea & vomiting   Dehydration   AKI (acute kidney injury) (HCC)   SIRS (systemic inflammatory response syndrome) (HCC)   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 female with a static cholangiocarcinoma with port in place receiving chemotherapy now with corynebacterium bacteremia. Transthoracic echocardiogram does not show evidence of endocarditis  1. Corynebacterium bacteremia due to port:  Port needs to be removed  I would then get blood cultures after port has been removed, should have a "line holiday"  Continue vancomycin  Would consider TEE but I worry with her low platelets about esophageal rupture in a patient who   Addendum:   Moderate to large pericardial effusion seen with TTE  Would ask Cardiology ? Need to do diagnostic pericardiocentesis or not. Not described in a way that sounds like it would be purulent pericarditis but I worry about this    LOS: 4 days   Alcide Evener 12/14/2019, 12:11 PM

## 2019-12-14 NOTE — Progress Notes (Signed)
PROGRESS NOTE  DALEY MOORADIAN TOI:[id number] DOB: September 15, 1965 DOA: 12/10/2019 PCP: Patient, No Pcp Per  Brief hospital course: Past medical history of, metastatic intrahepatic cholangiocarcinoma, followed at [facility] and currently undergoing tx cisplatin/gemcitabine, who was recently hospitalized in 10/2019 due to acute left MCA CVA and started on eliquis presents withnausea, vomiting, poor p.o. intake.  Found to have hypokalemia, hypomagnesemia, hypophosphatemia as well as pancytopenia with undetectable platelets.     HPI/Recap of past 24 hours:  Denies pain, no fever, some dysarthria, but able to talk clearly with slow pace  Reports having diarrhea when she is getting iv abx infusion, denies ab pain, no n/v  She cried when talked about her cancer diagnosis and how her husband and daughter of 14yr old have been coping   Assessment/Plan: Principal Problem:   Pancytopenia due to chemotherapy ([facility] Active Problems:   Nausea & vomiting   Dehydration   [place]   S[place] ([place]ic [place] syndrome) [place]   [place]   History of CVA (cerebrovascular accident)   Hypokalemia   Hypocalcemia  Bacteremia/febrile neutropenia/sepsis on presentation with fever 102.7, tachycardia, tachypnea, lactic acidosis Need port removal Infections disease input appreciated, will follow recommendation  Stage IV metastatic intrahepatic cholangiocarcinoma , present with pancytopenia, post chemo Present generalized fatigue found to have hemoglobin 6.5, platelets undetectable, neutropenia. EDP discussed with hematology on-call Dr. KIrene Limbowho recommended supportive measures with transfusion. Patient has received 2 PRBC and 1 platelet transfusion. Platelets improved, hemoglobin remained stable. No active bleeding. WBC and platelet improving, hemoglobin stable  Report nausea and vomiting at home None in the  hospital  Report poor oral intake at home due to loss of appetite denies of dysphagia She was observed choking on pills, will hold off oral medication, change to IV medication, aspiration precaution, speech eval  Diarrhea Report associated with IV antibiotics C. difficile negative, GI PCR panel negative Supportive care  Hypokalemia, continue replace K, check mag  AKI on CKD 2 Creatinine improving, renal dosing meds  Recent left MCA CVA Started on Eliquis. In the setting of thrombocytopenia currently holding all medications.   FTT, Poor prognosis, it appeared that her oncologist set up palliative care visit on 4/15, she currently declined palliative care while she is in the hospital  DVT Prophylaxis: SCDs  Code Status: Full  Family Communication: patient   Disposition Plan:    Patient came from:                         home                                                                                 Anticipated d/c place: she would like to set up home health services   Barriers to d/c OR conditions which need to be met to effect a safe d/c:  Treating bacteremia   Consultants:  ID  IR  Procedures:  Plan to remove Port-A-Cath  Antibiotics:  IV Vanco   Objective: BP (!) 115/53 (BP Location: Left Arm)    Pulse 77    Temp (!) 97.5 F (36.4 C) (  Oral)    Resp 20    Ht 5' 6"  (1.676 m)    Wt 90.2 kg    LMP 10/25/2014    SpO2 100%    BMI 32.10 kg/m  No intake or output data in the 24 hours ending 12/14/19 1339 Filed Weights   12/10/19 1430 12/10/19 2222  Weight: 93 kg 90.2 kg    Exam: Patient is examined daily including today on 12/14/2019, exams remain the same as of yesterday except that has changed    General:  Weak, flat affect, NAD  Cardiovascular: RRR  Respiratory: CTABL  Abdomen: Soft/ND/NT, positive BS  Musculoskeletal: No Edema  Neuro: alert, oriented x3, slowed speech  Data Reviewed: Basic Metabolic Panel: Recent Labs  Lab  12/10/19 1711 12/10/19 1808 12/11/19 1302 12/11/19 1302 12/12/19 0739 12/12/19 1555 12/13/19 0322 12/13/19 1555 12/14/19 0331  NA  --    < > 136   < > 137 137 137 137 139  K  --    < > 3.2*   < > 3.1* 3.1* 3.5 3.0* 3.4*  CL  --    < > 106   < > 109 109 109 106 109  CO2  --    < > 21*   < > 17* 17* 20* 18* 22  GLUCOSE  --    < > 143*   < > 90 122* 95 123* 89  BUN  --    < > 21*   < > 19 22* 21* 23* 21*  CREATININE  --    < > 1.56*   < > 1.29* 1.43* 1.31* 1.52* 1.17*  CALCIUM  --    < > 5.7*   < > 5.6* 5.9* 6.0* 6.6* 6.5*  MG 0.9*   < > 1.2*   < > 1.6* 2.2 2.0 1.9 1.8  PHOS <1.0*  --  <1.0*  --  2.0*  --  2.4*  --  2.8   < > = values in this interval not displayed.   Liver Function Tests: Recent Labs  Lab 12/10/19 1422 12/11/19 1302  AST 22 26  ALT 16 15  ALKPHOS 88 91  BILITOT 0.7 1.1  PROT 5.5* 4.9*  ALBUMIN 2.4* 2.2*   Recent Labs  Lab 12/10/19 1422  LIPASE 19   No results for input(s): AMMONIA in the last 168 hours. CBC: Recent Labs  Lab 12/10/19 1711 12/10/19 1711 12/10/19 1808 12/11/19 1302 12/12/19 0739 12/13/19 0322 12/14/19 0331  WBC 1.4*  --   --  1.5* 1.3* 1.8* 2.7*  NEUTROABS 0.7*  --   --  0.5*  --  0.2* 0.3*  HGB 6.4*   < > 5.8* 8.7* 8.4* 8.9* 8.8*  HCT 20.0*   < > 17.0* 26.5* 25.7* 28.1* 27.4*  MCV 81.3  --   --  81.3 82.6 82.4 80.8  PLT <5*  --   --  26* 28* 50* 71*   < > = values in this interval not displayed.   Cardiac Enzymes:   No results for input(s): CKTOTAL, CKMB, CKMBINDEX, TROPONINI in the last 168 hours. BNP (last 3 results) No results for input(s): BNP in the last 8760 hours.  ProBNP (last 3 results) No results for input(s): PROBNP in the last 8760 hours.  CBG: No results for input(s): GLUCAP in the last 168 hours.  Recent Results (from the past 240 hour(s))  Blood culture (routine x 2)     Status: Abnormal   Collection Time: 12/10/19  3:28  PM   Specimen: BLOOD  Result Value Ref Range Status   Specimen Description  BLOOD SITE NOT SPECIFIED  Final   Special Requests   Final    BOTTLES DRAWN AEROBIC AND ANAEROBIC Blood Culture adequate volume   Culture  Setup Time   Final    GRAM POSITIVE RODS AEROBIC BOTTLE ONLY CRITICAL RESULT CALLED TO, READ BACK BY AND VERIFIED WITH: L. CURRAN,PHARMD 2127 12/11/2019 T. TYSOR    Culture (A)  Final    DIPHTHEROIDS(CORYNEBACTERIUM SPECIES) Standardized susceptibility testing for this organism is not available. Performed at [facility], [place] [address], [place], [place] [address]    Report Status 12/12/2019 FINAL  Final  Respiratory Panel by RT PCR (Flu A&B, Covid) - Nasopharyngeal Swab     Status: None   Collection Time: 12/10/19  6:03 PM   Specimen: Nasopharyngeal Swab  Result Value Ref Range Status   SARS Coronavirus 2 by RT PCR NEGATIVE NEGATIVE Final    Comment: (NOTE) SARS-CoV-2 target nucleic acids are NOT DETECTED. The SARS-CoV-2 RNA is generally detectable in upper respiratoy specimens during the acute phase of infection. The lowest concentration of SARS-CoV-2 viral copies this assay can detect is 131 copies/mL. A negative result does not preclude SARS-Cov-2 infection and should not be used as the sole basis for treatment or other patient management decisions. A negative result may occur with  improper specimen collection/handling, submission of specimen other than nasopharyngeal swab, presence of viral mutation(s) within the areas targeted by this assay, and inadequate number of viral copies (<131 copies/mL). A negative result must be combined with clinical observations, patient history, and epidemiological information. The expected result is Negative. Fact Sheet for Patients:  PinkCheek.be Fact Sheet for Healthcare Providers:  GravelBags.it This test is not yet ap proved or cleared by the [place] FDA and  has been authorized for detection and/or diagnosis of SARS-CoV-2 by FDA  under an Emergency Use Authorization (EUA). This EUA will remain  in effect (meaning this test can be used) for the duration of the COVID-19 declaration under Section 564(b)(1) of the Act, 21 U.S.C. section 360bbb-3(b)(1), unless the authorization is terminated or revoked sooner.    Influenza A by PCR NEGATIVE NEGATIVE Final   Influenza B by PCR NEGATIVE NEGATIVE Final    Comment: (NOTE) The Xpert Xpress SARS-CoV-2/FLU/RSV assay is intended as an aid in  the diagnosis of influenza from Nasopharyngeal swab specimens and  should not be used as a sole basis for treatment. Nasal washings and  aspirates are unacceptable for Xpert Xpress SARS-CoV-2/FLU/RSV  testing. Fact Sheet for Patients: PinkCheek.be Fact Sheet for Healthcare Providers: GravelBags.it This test is not yet approved or cleared by the [place] FDA and  has been authorized for detection and/or diagnosis of SARS-CoV-2 by  FDA under an Emergency Use Authorization (EUA). This EUA will remain  in effect (meaning this test can be used) for the duration of the  Covid-19 declaration under Section 564(b)(1) of the Act, 21  U.S.C. section 360bbb-3(b)(1), unless the authorization is  terminated or revoked. Performed at [facility], [place] [address], [place], [place] [address]   Blood culture (routine x 2)     Status: Abnormal   Collection Time: 12/10/19  8:47 PM   Specimen: BLOOD  Result Value Ref Range Status   Specimen Description BLOOD SITE NOT SPECIFIED  Final   Special Requests   Final    BOTTLES DRAWN AEROBIC AND ANAEROBIC Blood Culture results may not be  optimal due to an inadequate volume of blood received in culture bottles   Culture  Setup Time   Final    GRAM POSITIVE RODS AEROBIC BOTTLE ONLY CRITICAL RESULT CALLED TO, READ BACK BY AND VERIFIED WITH: L. CURRAN,PHARMD 2127 12/11/2019 T. TYSOR    Culture (A)  Final    CORYNEBACTERIUM, GROUP  JK Standardized susceptibility testing for this organism is not available. Performed at [facility], [place] [address], [place], [place] [address]    Report Status 12/12/2019 FINAL  Final  C Difficile Quick Screen w PCR reflex     Status: None   Collection Time: 12/11/19 10:12 AM   Specimen: STOOL  Result Value Ref Range Status   C Diff antigen NEGATIVE NEGATIVE Final   C Diff toxin NEGATIVE NEGATIVE Final   C Diff interpretation No C. difficile detected.  Final    Comment: Performed at [facility], [place] [address] [place], [place] [address]  Culture, blood (x 2)     Status: None (Preliminary result)   Collection Time: 12/12/19  7:39 AM   Specimen: BLOOD  Result Value Ref Range Status   Specimen Description BLOOD SITE NOT SPECIFIED  Final   Special Requests   Final    BOTTLES DRAWN AEROBIC ONLY Blood Culture results may not be optimal due to an inadequate volume of blood received in culture bottles   Culture  Setup Time   Final    AEROBIC BOTTLE ONLY GRAM POSITIVE RODS CRITICAL VALUE NOTED.  VALUE IS CONSISTENT WITH PREVIOUSLY REPORTED AND CALLED VALUE.    Culture   Final    CULTURE REINCUBATED FOR BETTER GROWTH Performed at [facility], [place] [address] [place], [place] [address]    Report Status PENDING  Incomplete  Culture, blood (x 2)     Status: None (Preliminary result)   Collection Time: 12/12/19  7:53 AM   Specimen: BLOOD  Result Value Ref Range Status   Specimen Description BLOOD SITE NOT SPECIFIED  Final   Special Requests   Final    BOTTLES DRAWN AEROBIC ONLY Blood Culture results may not be optimal due to an inadequate volume of blood received in culture bottles   Culture  Setup Time   Final    AEROBIC BOTTLE ONLY GRAM POSITIVE RODS CRITICAL VALUE NOTED.  VALUE IS CONSISTENT WITH PREVIOUSLY REPORTED AND CALLED VALUE.    Culture   Final    CULTURE REINCUBATED FOR BETTER GROWTH Performed at [facility], [place] [address], [place], [place]  [address]    Report Status PENDING  Incomplete     Studies: ECHOCARDIOGRAM COMPLETE  Result Date: 12/13/2019    ECHOCARDIOGRAM REPORT   Patient Name:   AUBRIELLE STROUD Date of Exam: 12/13/2019 Medical Rec #:  [id number]       Height:       66.0 in Accession #:    [id number]      Weight:       198.9 lb Date of Birth:  1965-09-02      BSA:          1.995 m Patient Age:    54 years        BP:           112/68 mmHg Patient Gender: F               HR:           100 bpm. Exam Location:  Inpatient Procedure:  2D Echo and Intracardiac Opacification Agent Indications:    Corynebacteria Bacteremia 790.7 / R78.81  History:        Patient has prior history of Echocardiogram examinations, most                 recent 10/26/2019. Stroke. Metastatic intrahepatic                 cholangiocarcinoma, Pericardial effusion, Sinus Tachycardia.  Sonographer:    Darlina Sicilian RDCS Referring Phys: [id number] CORNELIUS N VAN DAM  Sonographer Comments: Suboptimal apical window. IMPRESSIONS  1. Left ventricular ejection fraction, by estimation, is 55 to 60%. The left ventricle has normal function. The left ventricle has no regional wall motion abnormalities. There is mild left ventricular hypertrophy. Indeterminate diastolic filling due to E-A fusion.  2. Right ventricular systolic function is normal. The right ventricular size is normal. Tricuspid regurgitation signal is inadequate for assessing PA pressure.  3. Left atrial size was mildly dilated.  4. Moderate to large pericardial effusion. The pericardial effusion is anterior to the right ventricle and surrounding the apex. There is no evidence of cardiac tamponade. No RV diastolic collapse, IVC appears small and collapsible. Respirometer evaluation not performed.  5. The mitral valve is normal in structure. No evidence of mitral valve regurgitation. No evidence of mitral stenosis.  6. The aortic valve is tricuspid. Aortic valve regurgitation is mild. No aortic stenosis is present.  7. The  inferior vena cava is normal in size with greater than 50% respiratory variability, suggesting right atrial pressure of 3 mmHg. Conclusion(s)/Recommendation(s): No evidence of valvular vegetations on this transthoracic echocardiogram. Would recommend a transesophageal echocardiogram to exclude infective endocarditis if clinically indicated. FINDINGS  Left Ventricle: Left ventricular ejection fraction, by estimation, is 55 to 60%. The left ventricle has normal function. The left ventricle has no regional wall motion abnormalities. Definity contrast agent was given IV to delineate the left ventricular  endocardial borders. The left ventricular internal cavity size was normal in size. There is mild left ventricular hypertrophy. Indeterminate diastolic filling due to E-A fusion. Right Ventricle: The right ventricular size is normal. No increase in right ventricular wall thickness. Right ventricular systolic function is normal. Tricuspid regurgitation signal is inadequate for assessing PA pressure. Left Atrium: Left atrial size was mildly dilated. Right Atrium: Right atrial size was normal in size. Pericardium: Moderate to large pericardial effusion. The pericardial effusion is anterior to the right ventricle and surrounding the apex. There is no evidence of cardiac tamponade. Mitral Valve: The mitral valve is normal in structure. Normal mobility of the mitral valve leaflets. No evidence of mitral valve regurgitation. No evidence of mitral valve stenosis. Tricuspid Valve: The tricuspid valve is normal in structure. Tricuspid valve regurgitation is not demonstrated. No evidence of tricuspid stenosis. Aortic Valve: The aortic valve is tricuspid. Aortic valve regurgitation is mild. No aortic stenosis is present. Pulmonic Valve: The pulmonic valve was normal in structure. Pulmonic valve regurgitation is not visualized. No evidence of pulmonic stenosis. Aorta: The aortic root is normal in size and structure. Venous: The  inferior vena cava is normal in size with greater than 50% respiratory variability, suggesting right atrial pressure of 3 mmHg. IAS/Shunts: No atrial level shunt detected by color flow Doppler.  LEFT VENTRICLE PLAX 2D LVIDd:         3.40 cm     Diastology LVIDs:         2.40 cm     LV e' lateral:   4.46 cm/s  LV PW:         1.00 cm     LV E/e' lateral: 12.5 LV IVS:        1.10 cm     LV e' medial:    5.22 cm/s LVOT diam:     2.00 cm     LV E/e' medial:  10.7 LV SV:         34 LV SV Index:   17 LVOT Area:     3.14 cm  LV Volumes (MOD) LV vol d, MOD A2C: 78.6 ml LV vol d, MOD A4C: 93.2 ml LV vol s, MOD A2C: 38.6 ml LV vol s, MOD A4C: 50.6 ml LV SV MOD A2C:     40.0 ml LV SV MOD A4C:     93.2 ml LV SV MOD BP:      40.3 ml RIGHT VENTRICLE RV S prime:     27.60 cm/s LEFT ATRIUM           Index       RIGHT ATRIUM          Index LA diam:      2.60 cm 1.30 cm/m  RA Area:     9.78 cm LA Vol (A2C): 28.5 ml 14.29 ml/m RA Volume:   17.25 ml 8.65 ml/m LA Vol (A4C): 74.1 ml 37.14 ml/m  AORTIC VALVE LVOT Vmax:   75.00 cm/s LVOT Vmean:  54.700 cm/s LVOT VTI:    0.108 m  AORTA Ao Root diam: 3.00 cm MITRAL VALVE MV Area (PHT): 6.43 cm    SHUNTS MV Decel Time: 118 msec    Systemic VTI:  0.11 m MV E velocity: 55.60 cm/s  Systemic Diam: 2.00 cm MV A velocity: 98.20 cm/s MV E/A ratio:  0.57 Cherlynn Kaiser MD Electronically signed by Cherlynn Kaiser MD Signature Date/Time: 12/13/2019/7:29:39 PM    Final     Scheduled Meds:  sodium chloride   Intravenous Once   calcium carbonate  1 tablet Oral TID WC   Chlorhexidine Gluconate Cloth  6 each Topical Daily   feeding supplement (ENSURE ENLIVE)  237 mL Oral TID BM   metoprolol tartrate  25 mg Oral BID   phosphorus  500 mg Oral TID   sodium chloride flush  10-40 mL Intracatheter Q12H   sodium chloride flush  3 mL Intravenous Once   sodium chloride flush  3 mL Intravenous Q12H    Continuous Infusions:  lactated ringers 100 mL/hr (12/11/19 1831)   vancomycin 1,000  mg (12/14/19 1235)     Time spent: 45mns I have personally reviewed and interpreted on  12/14/2019 daily labs, tele strips, imagings as discussed above under date review session and assessment and plans.  I reviewed all nursing notes, pharmacy notes, consultant notes,  vitals, pertinent old records  I have discussed plan of care as described above with RN , patient  on 12/14/2019   FFlorencia ReasonsMD, PhD, FACP  [facility]  Available via Epic secure chat 7am-7pm for nonurgent issues Please page for urgent issues, pager number available through [email or web address] .   12/14/2019, 1:39 PM  LOS: 4 days

## 2019-12-14 NOTE — Progress Notes (Signed)
0939- received a call from central telemetry that patient HR is 142. RN informed central tele that I'm assisting with patient to the bathroom. Recheck HR was 108. Informed MD that patient was moving at the time.

## 2019-12-14 NOTE — Progress Notes (Signed)
Chief Complaint: Patient was seen in consultation today for port removal  Referring Physician(s): Dr. Florencia Reasons Supervising Physician: Aletta Edouard  Patient Status: [facility] - In-pt  History of Present Illness: Melissa Hurley is a 54 y.o. female with hx of cholangiocarcinoma. Had a dual-port placed in Feb of this year at [facility] and has been receiving treatment. Now admitted with febrile neutropenia and blood cultures positive. ID on board and asks that Port be removed. PMHx, meds, labs, imaging, allergies reviewed. Feels well otherwise since IV abx started.     Past Medical History:  Diagnosis Date  . Cancer ([place])    Liver  . Cholangiocarcinoma ([place])   . Metastasis ([place])   . Pericardial effusion   . Sinus tachycardia   . Uterine fibroid     Past Surgical History:  Procedure Laterality Date  . CESAREAN SECTION      Allergies: Patient has no known allergies.  Medications:  Current Facility-Administered Medications:  .  0.9 %  sodium chloride infusion (Manually program via Guardrails IV Fluids), , Intravenous, Once, Dessa Phi, DO .  acetaminophen (TYLENOL) tablet 650 mg, 650 mg, Oral, Q6H PRN, 650 mg at 12/11/19 1956 **OR** acetaminophen (TYLENOL) suppository 650 mg, 650 mg, Rectal, Q6H PRN, Dessa Phi, DO, 650 mg at 12/11/19 0104 .  calcium carbonate (OS-CAL - dosed in mg of elemental calcium) tablet 500 mg of elemental calcium, 1 tablet, Oral, TID WC, Lavina Hamman, MD, 500 mg of elemental calcium at 12/14/19 1240 .  Chlorhexidine Gluconate Cloth 2 % PADS 6 each, 6 each, Topical, Daily, Dessa Phi, DO, 6 each at 12/14/19 [phone] .  feeding supplement (ENSURE ENLIVE) (ENSURE ENLIVE) liquid 237 mL, 237 mL, Oral, TID BM, Lavina Hamman, MD .  lactated ringers infusion, , Intravenous, Continuous, Lavina Hamman, MD, Last Rate: 100 mL/hr at 12/11/19 1831, 100 mL/hr at 12/11/19 1831 .  LORazepam (ATIVAN) tablet 1 mg, 1 mg, Oral, Q8H PRN, Dessa Phi, DO .   metoprolol tartrate (LOPRESSOR) tablet 25 mg, 25 mg, Oral, BID, Lavina Hamman, MD, 25 mg at 12/14/19 [id number] .  ondansetron (ZOFRAN) injection 4 mg, 4 mg, Intravenous, Q6H PRN, Dessa Phi, DO, 4 mg at 12/10/19 1853 .  phosphorus (K PHOS NEUTRAL) tablet 500 mg, 500 mg, Oral, TID, Lavina Hamman, MD, 500 mg at 12/14/19 [id number] .  sodium chloride flush (NS) 0.9 % injection 10-40 mL, 10-40 mL, Intracatheter, Q12H, Dessa Phi, DO, 10 mL at 12/13/19 2128 .  sodium chloride flush (NS) 0.9 % injection 10-40 mL, 10-40 mL, Intracatheter, PRN, Dessa Phi, DO .  sodium chloride flush (NS) 0.9 % injection 3 mL, 3 mL, Intravenous, Once, Dessa Phi, DO .  sodium chloride flush (NS) 0.9 % injection 3 mL, 3 mL, Intravenous, Q12H, Dessa Phi, DO, 3 mL at 12/14/19 [id number] .  vancomycin (VANCOCIN) IVPB 1000 mg/200 mL premix, 1,000 mg, Intravenous, Q24H, Karren Cobble, RPH, Last Rate: 200 mL/hr at 12/14/19 1235, 1,000 mg at 12/14/19 1235    Family History  Problem Relation Age of Onset  . Breast cancer Mother        33's  . Ovarian cancer Mother   . Lung cancer Mother        30s  . Ovarian cancer Maternal Aunt   . Breast cancer Maternal Grandmother   . Breast cancer Cousin        maternal, dx50+  . Colon cancer Neg Hx   . Esophageal cancer Neg Hx   .  Rectal cancer Neg Hx   . Stomach cancer Neg Hx     Social History   Socioeconomic History  . Marital status: Married    Spouse name: Not on file  . Number of children: Not on file  . Years of education: Not on file  . Highest education level: Not on file  Occupational History  . Occupation: Pharmacist, hospital  Tobacco Use  . Smoking status: Never Smoker  . Smokeless tobacco: Never Used  Substance and Sexual Activity  . Alcohol use: Never  . Drug use: Never  . Sexual activity: Not Currently  Other Topics Concern  . Not on file  Social History Narrative  . Not on file   Social Determinants of Health   Financial Resource Strain:     . Difficulty of Paying Living Expenses:   Food Insecurity:   . Worried About Charity fundraiser in the Last Year:   . Arboriculturist in the Last Year:   Transportation Needs:   . Film/video editor (Medical):   Marland Kitchen Lack of Transportation (Non-Medical):   Physical Activity:   . Days of Exercise per Week:   . Minutes of Exercise per Session:   Stress:   . Feeling of Stress :   Social Connections:   . Frequency of Communication with Friends and Family:   . Frequency of Social Gatherings with Friends and Family:   . Attends Religious Services:   . Active Member of Clubs or Organizations:   . Attends Archivist Meetings:   Marland Kitchen Marital Status:      Review of Systems: A 12 point ROS discussed and pertinent positives are indicated in the HPI above.  All other systems are negative.  Review of Systems  Vital Signs: BP 117/61   Pulse 95   Temp (!) 97.5 F (36.4 C) (Oral)   Resp (!) 24   Ht 5\' 6"  (1.676 m)   Wt 90.2 kg   LMP 10/25/2014   SpO2 99%   BMI 32.10 kg/m   Physical Exam Constitutional:      Appearance: Normal appearance.  HENT:     Mouth/Throat:     Mouth: Mucous membranes are moist.     Pharynx: Oropharynx is clear.  Cardiovascular:     Rate and Rhythm: Normal rate and regular rhythm.     Heart sounds: Normal heart sounds.  Pulmonary:     Effort: Pulmonary effort is normal. No respiratory distress.     Breath sounds: Normal breath sounds.  Skin:    General: Skin is warm and dry.     Comments: (R)chest port palpable, NT, no erythema.  Neurological:     General: No focal deficit present.     Mental Status: She is alert and oriented to person, place, and time.  Psychiatric:        Mood and Affect: Mood normal.        Thought Content: Thought content normal.        Judgment: Judgment normal.       Imaging: DG Chest Port 1 View  Result Date: 12/10/2019 CLINICAL DATA:  54 year old female with weakness. EXAM: PORTABLE CHEST 1 VIEW COMPARISON:   Chest radiograph dated 10/25/2019. FINDINGS: Right-sided Port-A-Cath with tip at the cavoatrial junction. There is no focal consolidation, pleural effusion, pneumothorax. Mild cardiomegaly. Atherosclerotic calcification of the aortic arch. No acute osseous pathology. IMPRESSION: 1. No acute cardiopulmonary process. 2. Mild cardiomegaly. Electronically Signed   By: Laren Everts.D.  On: 12/10/2019 16:21   DG Abd Portable 1V  Result Date: 12/11/2019 CLINICAL DATA:  Nausea weakness in emesis EXAM: PORTABLE ABDOMEN - 1 VIEW COMPARISON:  CT study 07/30/2019 FINDINGS: Centralized mildly dilated loops of small bowel. No visible colonic or rectal gas. LEFT flank excluded from view. Calcified fibroid in the LEFT lower quadrant. No acute bone process. IMPRESSION: Centralized mildly dilated loops of small bowel. No visible colonic or rectal gas. Findings may reflect ileus versus early or small bowel obstruction. Electronically Signed   By: Zetta Bills M.D.   On: 12/11/2019 14:46   ECHOCARDIOGRAM COMPLETE  Result Date: 12/13/2019    ECHOCARDIOGRAM REPORT   Patient Name:   Melissa Hurley Date of Exam: 12/13/2019 Medical Rec #:  [id number]       Height:       66.0 in Accession #:    [id number]      Weight:       198.9 lb Date of Birth:  02/21/1966      BSA:          1.995 m Patient Age:    71 years        BP:           112/68 mmHg Patient Gender: F               HR:           100 bpm. Exam Location:  Inpatient Procedure: 2D Echo and Intracardiac Opacification Agent Indications:    Corynebacteria Bacteremia 790.7 / R78.81  History:        Patient has prior history of Echocardiogram examinations, most                 recent 10/26/2019. Stroke. Metastatic intrahepatic                 cholangiocarcinoma, Pericardial effusion, Sinus Tachycardia.  Sonographer:    Darlina Sicilian RDCS Referring Phys: [id number] CORNELIUS N VAN DAM  Sonographer Comments: Suboptimal apical window. IMPRESSIONS  1. Left ventricular ejection  fraction, by estimation, is 55 to 60%. The left ventricle has normal function. The left ventricle has no regional wall motion abnormalities. There is mild left ventricular hypertrophy. Indeterminate diastolic filling due to E-A fusion.  2. Right ventricular systolic function is normal. The right ventricular size is normal. Tricuspid regurgitation signal is inadequate for assessing PA pressure.  3. Left atrial size was mildly dilated.  4. Moderate to large pericardial effusion. The pericardial effusion is anterior to the right ventricle and surrounding the apex. There is no evidence of cardiac tamponade. No RV diastolic collapse, IVC appears small and collapsible. Respirometer evaluation not performed.  5. The mitral valve is normal in structure. No evidence of mitral valve regurgitation. No evidence of mitral stenosis.  6. The aortic valve is tricuspid. Aortic valve regurgitation is mild. No aortic stenosis is present.  7. The inferior vena cava is normal in size with greater than 50% respiratory variability, suggesting right atrial pressure of 3 mmHg. Conclusion(s)/Recommendation(s): No evidence of valvular vegetations on this transthoracic echocardiogram. Would recommend a transesophageal echocardiogram to exclude infective endocarditis if clinically indicated. FINDINGS  Left Ventricle: Left ventricular ejection fraction, by estimation, is 55 to 60%. The left ventricle has normal function. The left ventricle has no regional wall motion abnormalities. Definity contrast agent was given IV to delineate the left ventricular  endocardial borders. The left ventricular internal cavity size was normal in size. There is mild left ventricular hypertrophy. Indeterminate diastolic  filling due to E-A fusion. Right Ventricle: The right ventricular size is normal. No increase in right ventricular wall thickness. Right ventricular systolic function is normal. Tricuspid regurgitation signal is inadequate for assessing PA pressure.  Left Atrium: Left atrial size was mildly dilated. Right Atrium: Right atrial size was normal in size. Pericardium: Moderate to large pericardial effusion. The pericardial effusion is anterior to the right ventricle and surrounding the apex. There is no evidence of cardiac tamponade. Mitral Valve: The mitral valve is normal in structure. Normal mobility of the mitral valve leaflets. No evidence of mitral valve regurgitation. No evidence of mitral valve stenosis. Tricuspid Valve: The tricuspid valve is normal in structure. Tricuspid valve regurgitation is not demonstrated. No evidence of tricuspid stenosis. Aortic Valve: The aortic valve is tricuspid. Aortic valve regurgitation is mild. No aortic stenosis is present. Pulmonic Valve: The pulmonic valve was normal in structure. Pulmonic valve regurgitation is not visualized. No evidence of pulmonic stenosis. Aorta: The aortic root is normal in size and structure. Venous: The inferior vena cava is normal in size with greater than 50% respiratory variability, suggesting right atrial pressure of 3 mmHg. IAS/Shunts: No atrial level shunt detected by color flow Doppler.  LEFT VENTRICLE PLAX 2D LVIDd:         3.40 cm     Diastology LVIDs:         2.40 cm     LV e' lateral:   4.46 cm/s LV PW:         1.00 cm     LV E/e' lateral: 12.5 LV IVS:        1.10 cm     LV e' medial:    5.22 cm/s LVOT diam:     2.00 cm     LV E/e' medial:  10.7 LV SV:         34 LV SV Index:   17 LVOT Area:     3.14 cm  LV Volumes (MOD) LV vol d, MOD A2C: 78.6 ml LV vol d, MOD A4C: 93.2 ml LV vol s, MOD A2C: 38.6 ml LV vol s, MOD A4C: 50.6 ml LV SV MOD A2C:     40.0 ml LV SV MOD A4C:     93.2 ml LV SV MOD BP:      40.3 ml RIGHT VENTRICLE RV S prime:     27.60 cm/s LEFT ATRIUM           Index       RIGHT ATRIUM          Index LA diam:      2.60 cm 1.30 cm/m  RA Area:     9.78 cm LA Vol (A2C): 28.5 ml 14.29 ml/m RA Volume:   17.25 ml 8.65 ml/m LA Vol (A4C): 74.1 ml 37.14 ml/m  AORTIC VALVE LVOT  Vmax:   75.00 cm/s LVOT Vmean:  54.700 cm/s LVOT VTI:    0.108 m  AORTA Ao Root diam: 3.00 cm MITRAL VALVE MV Area (PHT): 6.43 cm    SHUNTS MV Decel Time: 118 msec    Systemic VTI:  0.11 m MV E velocity: 55.60 cm/s  Systemic Diam: 2.00 cm MV A velocity: 98.20 cm/s MV E/A ratio:  0.57 Cherlynn Kaiser MD Electronically signed by Cherlynn Kaiser MD Signature Date/Time: 12/13/2019/7:29:39 PM    Final     Labs:  CBC: Recent Labs    12/11/19 1302 12/12/19 0739 12/13/19 0322 12/14/19 0331  WBC 1.5* 1.3* 1.8* 2.7*  HGB 8.7*  8.4* 8.9* 8.8*  HCT 26.5* 25.7* 28.1* 27.4*  PLT 26* 28* 50* 71*    COAGS: Recent Labs    08/17/19 1130 08/27/19 0905 09/06/19 0726 10/25/19 1535  INR 1.1 1.2 1.1 1.4*  APTT 32  --   --   --     BMP: Recent Labs    12/12/19 1555 12/13/19 0322 12/13/19 1555 12/14/19 0331  NA 137 137 137 139  K 3.1* 3.5 3.0* 3.4*  CL 109 109 106 109  CO2 17* 20* 18* 22  GLUCOSE 122* 95 123* 89  BUN 22* 21* 23* 21*  CALCIUM 5.9* 6.0* 6.6* 6.5*  CREATININE 1.43* 1.31* 1.52* 1.17*  GFRNONAA 42* 47* 39* 54*  GFRAA 49* 54* 45* >60    LIVER FUNCTION TESTS: Recent Labs    08/27/19 0905 10/25/19 1535 12/10/19 1422 12/11/19 1302  BILITOT 0.9 0.7 0.7 1.1  AST 19 25 22 26   ALT 11 26 16 15   ALKPHOS 136* 105 88 91  PROT 7.5 6.3* 5.5* 4.9*  ALBUMIN 2.9* 2.6* 2.4* 2.2*    TUMOR MARKERS: No results for input(s): AFPTM, CEA, CA199, CHROMGRNA in the last 8760 hours.  Assessment and Plan: Hx cholangiocarcinoma Now with bacteremia, felt to be from port Plan for port removal Labs reviewed, PLTs trending up, last 71k NPO p MN Risks and benefits of port-a-catheter removal was discussed with the patient including, but not limited to bleeding, infection, pneumothorax, or fibrin sheath development and need for additional procedures.  All of the patient's questions were answered, patient is agreeable to proceed. Consent signed and in chart.    Thank you for this  interesting consult.  I greatly enjoyed meeting Melissa Hurley and look forward to participating in their care.  A copy of this report was sent to the requesting provider on this date.  Electronically Signed: Ascencion Dike, PA-C 12/14/2019, 2:34 PM   I spent a total of 20 minutes in face to face in clinical consultation, greater than 50% of which was counseling/coordinating care for port removal

## 2019-12-14 NOTE — Progress Notes (Signed)
Notified charge the reason for MEWS change to yellow because pt VS was not taken at resting state. MEWS is now green.

## 2019-12-15 ENCOUNTER — Inpatient Hospital Stay (HOSPITAL_COMMUNITY): Payer: BC Managed Care – PPO

## 2019-12-15 DIAGNOSIS — I3139 Other pericardial effusion (noninflammatory): Secondary | ICD-10-CM

## 2019-12-15 DIAGNOSIS — I313 Pericardial effusion (noninflammatory): Secondary | ICD-10-CM | POA: Diagnosis not present

## 2019-12-15 DIAGNOSIS — T827XXA Infection and inflammatory reaction due to other cardiac and vascular devices, implants and grafts, initial encounter: Secondary | ICD-10-CM | POA: Diagnosis not present

## 2019-12-15 DIAGNOSIS — B9689 Other specified bacterial agents as the cause of diseases classified elsewhere: Secondary | ICD-10-CM | POA: Diagnosis not present

## 2019-12-15 DIAGNOSIS — R7881 Bacteremia: Secondary | ICD-10-CM | POA: Diagnosis not present

## 2019-12-15 HISTORY — PX: IR REMOVAL TUN ACCESS W/ PORT W/O FL MOD SED: IMG2290

## 2019-12-15 LAB — BASIC METABOLIC PANEL
Anion gap: 15 (ref 5–15)
BUN: 21 mg/dL — ABNORMAL HIGH (ref 6–20)
CO2: 15 mmol/L — ABNORMAL LOW (ref 22–32)
Calcium: 6.8 mg/dL — ABNORMAL LOW (ref 8.9–10.3)
Chloride: 110 mmol/L (ref 98–111)
Creatinine, Ser: 1.25 mg/dL — ABNORMAL HIGH (ref 0.44–1.00)
GFR calc Af Amer: 57 mL/min — ABNORMAL LOW (ref >60)
GFR calc non Af Amer: 49 mL/min — ABNORMAL LOW (ref >60)
Glucose, Bld: 89 mg/dL (ref 70–99)
Potassium: 3.7 mmol/L (ref 3.5–5.1)
Sodium: 140 mmol/L (ref 135–145)

## 2019-12-15 LAB — CULTURE, BLOOD (ROUTINE X 2)

## 2019-12-15 LAB — CBC WITH DIFFERENTIAL/PLATELET
Abs Immature Granulocytes: 0.27 10*3/uL — ABNORMAL HIGH (ref 0.00–0.07)
Basophils Absolute: 0 10*3/uL (ref 0.0–0.1)
Basophils Relative: 1 %
Eosinophils Absolute: 0 10*3/uL (ref 0.0–0.5)
Eosinophils Relative: 0 %
HCT: 29.3 % — ABNORMAL LOW (ref 36.0–46.0)
Hemoglobin: 9.5 g/dL — ABNORMAL LOW (ref 12.0–15.0)
Immature Granulocytes: 5 %
Lymphocytes Relative: 39 %
Lymphs Abs: 2 10*3/uL (ref 0.7–4.0)
MCH: 26.4 pg (ref 26.0–34.0)
MCHC: 32.4 g/dL (ref 30.0–36.0)
MCV: 81.4 fL (ref 80.0–100.0)
Monocytes Absolute: 1.8 10*3/uL — ABNORMAL HIGH (ref 0.1–1.0)
Monocytes Relative: 34 %
Neutro Abs: 1.1 10*3/uL — ABNORMAL LOW (ref 1.7–7.7)
Neutrophils Relative %: 21 %
Platelets: 125 10*3/uL — ABNORMAL LOW (ref 150–400)
RBC: 3.6 MIL/uL — ABNORMAL LOW (ref 3.87–5.11)
RDW: 19.5 % — ABNORMAL HIGH (ref 11.5–15.5)
WBC: 5.2 10*3/uL (ref 4.0–10.5)
nRBC: 1.2 % — ABNORMAL HIGH (ref 0.0–0.2)

## 2019-12-15 LAB — PATHOLOGIST SMEAR REVIEW

## 2019-12-15 LAB — MAGNESIUM: Magnesium: 1.7 mg/dL (ref 1.7–2.4)

## 2019-12-15 LAB — PHOSPHORUS: Phosphorus: 3 mg/dL (ref 2.5–4.6)

## 2019-12-15 MED ORDER — FENTANYL CITRATE (PF) 100 MCG/2ML IJ SOLN
INTRAMUSCULAR | Status: AC | PRN
  Administered 2019-12-15 (×2): 50 ug via INTRAVENOUS

## 2019-12-15 MED ORDER — MIDAZOLAM HCL 2 MG/2ML IJ SOLN
INTRAMUSCULAR | Status: AC
  Filled 2019-12-15: qty 2

## 2019-12-15 MED ORDER — MAGNESIUM SULFATE IN D5W 1-5 GM/100ML-% IV SOLN
1.0000 g | Freq: Once | INTRAVENOUS | Status: AC
  Administered 2019-12-15: 1 g via INTRAVENOUS
  Filled 2019-12-15: qty 100

## 2019-12-15 MED ORDER — FENTANYL CITRATE (PF) 100 MCG/2ML IJ SOLN
INTRAMUSCULAR | Status: AC
  Filled 2019-12-15: qty 2

## 2019-12-15 MED ORDER — CEFAZOLIN SODIUM-DEXTROSE 2-4 GM/100ML-% IV SOLN
INTRAVENOUS | Status: AC
  Filled 2019-12-15: qty 100

## 2019-12-15 MED ORDER — LIDOCAINE HCL 1 % IJ SOLN
INTRAMUSCULAR | Status: AC | PRN
  Administered 2019-12-15: 10 mL

## 2019-12-15 MED ORDER — LIDOCAINE HCL 1 % IJ SOLN
INTRAMUSCULAR | Status: AC
  Filled 2019-12-15: qty 20

## 2019-12-15 MED ORDER — MIDAZOLAM HCL 2 MG/2ML IJ SOLN
INTRAMUSCULAR | Status: AC | PRN
  Administered 2019-12-15 (×3): 1 mg via INTRAVENOUS

## 2019-12-15 MED ORDER — POTASSIUM CHLORIDE CRYS ER 20 MEQ PO TBCR
40.0000 meq | EXTENDED_RELEASE_TABLET | Freq: Once | ORAL | Status: AC
  Administered 2019-12-15: 40 meq via ORAL
  Filled 2019-12-15: qty 2

## 2019-12-15 MED ORDER — METOPROLOL TARTRATE 12.5 MG HALF TABLET
12.5000 mg | ORAL_TABLET | Freq: Two times a day (BID) | ORAL | Status: DC
  Administered 2019-12-15 – 2019-12-17 (×4): 12.5 mg via ORAL
  Filled 2019-12-15 (×5): qty 1

## 2019-12-15 NOTE — Progress Notes (Signed)
RN notified by tele, pt had 8 second burst of SVT with HR reaching into the 170s. HR currently back to low 100s. MD Erlinda Hong has been notified.

## 2019-12-15 NOTE — Procedures (Signed)
Interventional Radiology Procedure Note  Procedure: Removal of a right IJ approach single lumen PowerPort.   Double port. Placed at [facility].   Site looks clean, with no pus, no erythema, no sign of local infection. Closed primarily after thorough irrigation.  Tip sent.    Complications: None  Recommendations:  - Routine wound care - Do not submerge - Follow up culture   Signed,  Dulcy Fanny. Earleen Newport, DO

## 2019-12-15 NOTE — Consult Note (Addendum)
Cardiology Consultation:   Patient ID: Melissa Hurley MRN: [id number]; DOB: [birth year]  Admit date: 12/10/2019 Date of Consult: 12/15/2019  Primary Care Provider: Patient, No Pcp Per Primary Cardiologist: Will Meredith Leeds, MD  Primary Electrophysiologist:  None    Patient Profile:   Melissa Hurley is a 54 y.o. female with a hx of metastatic intrahepatic cholangiocarcinoma followed at [facility] and currently undergoing tx, previous  (suspected malignant)pericardial effusion, CKD stage 2, recent CVA on Eliquis who is being seen today for the evaluation of pericardial effusion at the request of Dr. Erlinda Hong.  History of Present Illness:   Melissa Hurley was referred to Dr. Curt Bears for pericardial effusion and was seen 08/02/20. She had been seen at GI for abdominal fullness. She had a CT that showed multiple masses in her liver and a moderate sized pericardial effusion. Patient denied chest pain, orthopnea, LLE. She had some SOB. Echo obtained showed no evidence of tamponade, EF 55-60%. Possible malignant effusion from biliary source. The patient was seen back in the office 09/22/19 and had sob with tachycardia and stat echo showed small effusion without signs of tamponade. CTA negative for PE but did show pulmonary nodules. The patient was started on Lopressor for tachycardia.  The patient was hospitalized 3/20201 due to left MCA CVA and started on Eliquis.   The patient presented with fever, nausea, vomiting, poor PO intake found to have hypokalemia, hypomagnesemia, hypophosphatemia and pancytopenia admitted 12/10/19 with febrile neutropenia, SIRS, cornybacteremia, AKI. CXR showed mild cardiomegaly. ID was consulted and port was removed due to bacteremia. Oncology called who recommended 2 PRBC and 1 platelet transfusion. For h/o of pericardial effusion Echo was obtained 4/26 which showed moderate to large pericardial effusion with no evidence of cardiac tamponade, EF 55-60%. Cardiology was consulted.    Labs: Potassium 3.7 Creatinine 1.25 Calcium 6.8 Mag 1.7 WBC 5.2 Hgb 9.5  No history of MI or stent. No tobacco, drug, alcohol history. Denies chest pain, palpitations, sob, LLE, orthopnea.   Past Medical History:  Diagnosis Date  . Cancer ([place])    Liver  . Cholangiocarcinoma ([place])   . Metastasis ([place])   . Pericardial effusion   . Sinus tachycardia   . Uterine fibroid     Past Surgical History:  Procedure Laterality Date  . CESAREAN SECTION       Home Medications:  Prior to Admission medications   Medication Sig Start Date End Date Taking? Authorizing Provider  apixaban (ELIQUIS) 5 MG TABS tablet Take 1 tablet (5 mg total) by mouth 2 (two) times daily. 10/28/19  Yes Amin, Ankit Chirag, MD  dexamethasone (DECADRON) 4 MG tablet Take 8 mg by mouth See admin instructions. Take 2 tablets (8 mg)  by mouth with breakfast on days 2 and 3 of each treatment cycle 10/04/19  Yes [provider]  glycopyrrolate (ROBINUL) 1 MG tablet Take 1 mg by mouth 3 (three) times daily as needed (drooling).  12/06/19  Yes [provider]  lidocaine-prilocaine (EMLA) cream Apply small amount topically to port  1-2 hours prior to anticipated access 11/10/19  Yes [provider]  LORazepam (ATIVAN) 1 MG tablet Take 1 mg by mouth every 8 (eight) hours as needed for anxiety.  10/12/19  Yes [provider]  megestrol (MEGACE) 40 MG/ML suspension Take 400 mg by mouth 2 (two) times daily as needed (appetite).  09/20/19  Yes [provider]  nystatin (MYCOSTATIN) 100000 UNIT/ML suspension Take 5 mLs by mouth every 4 (four) hours as needed (  thrush).  10/14/19  Yes [provider]  ondansetron (ZOFRAN) 8 MG tablet Take 8 mg by mouth every 8 (eight) hours as needed for nausea.  09/20/19  Yes [provider]  prochlorperazine (COMPAZINE) 10 MG tablet Take 10 mg by mouth every 8 (eight) hours as needed for nausea or vomiting.   Yes [provider]     Inpatient Medications: Scheduled Meds: . sodium chloride   Intravenous Once  . Chlorhexidine Gluconate Cloth  6 each Topical Daily  . feeding supplement (ENSURE ENLIVE)  237 mL Oral TID BM  . fentaNYL      . lidocaine      . midazolam      . sodium chloride flush  10-40 mL Intracatheter Q12H  . sodium chloride flush  3 mL Intravenous Once  . sodium chloride flush  3 mL Intravenous Q12H   Continuous Infusions: . ceFAZolin    . lactated ringers 100 mL/hr (12/11/19 1831)  . vancomycin 1,000 mg (12/14/19 1235)   PRN Meds: acetaminophen **OR** acetaminophen, LORazepam, metoprolol tartrate, ondansetron (ZOFRAN) IV, sodium chloride flush  Allergies:   No Known Allergies  Social History:   Social History   Socioeconomic History  . Marital status: Married    Spouse name: Not on file  . Number of children: Not on file  . Years of education: Not on file  . Highest education level: Not on file  Occupational History  . Occupation: Pharmacist, hospital  Tobacco Use  . Smoking status: Never Smoker  . Smokeless tobacco: Never Used  Substance and Sexual Activity  . Alcohol use: Never  . Drug use: Never  . Sexual activity: Not Currently  Other Topics Concern  . Not on file  Social History Narrative  . Not on file   Social Determinants of Health   Financial Resource Strain:   . Difficulty of Paying Living Expenses:   Food Insecurity:   . Worried About Charity fundraiser in the Last Year:   . Arboriculturist in the Last Year:   Transportation Needs:   . Film/video editor (Medical):   Marland Kitchen Lack of Transportation (Non-Medical):   Physical Activity:   . Days of Exercise per Week:   . Minutes of Exercise per Session:   Stress:   . Feeling of Stress :   Social Connections:   . Frequency of Communication with Friends and Family:   . Frequency of Social Gatherings with Friends and Family:   . Attends Religious Services:   . Active Member of Clubs or Organizations:   . Attends English as a second language teacher Meetings:   Marland Kitchen Marital Status:   Intimate Partner Violence:   . Fear of Current or Ex-Partner:   . Emotionally Abused:   Marland Kitchen Physically Abused:   . Sexually Abused:     Family History:    Family History  Problem Relation Age of Onset  . Breast cancer Mother        76's  . Ovarian cancer Mother   . Lung cancer Mother        19s  . Ovarian cancer Maternal Aunt   . Breast cancer Maternal Grandmother   . Breast cancer Cousin        maternal, dx50+  . Colon cancer Neg Hx   . Esophageal cancer Neg Hx   . Rectal cancer Neg Hx   . Stomach cancer Neg Hx      ROS:  Please see the history of present illness.  All other ROS reviewed and negative.     Physical Exam/Data:   Vitals:   12/15/19 1335 12/15/19 1340 12/15/19 1345 12/15/19 1350  BP: (!) 137/105 (!) 113/94 (!) 121/95 117/89  Pulse: (!) 103 (!) 106 (!) 103 (!) 108  Resp: (!) 23 (!) 21 16 15   Temp:      TempSrc:      SpO2: 100% 97% 100% 100%  Weight:      Height:        Intake/Output Summary (Last 24 hours) at 12/15/2019 1415 Last data filed at 12/14/2019 2000 Gross per 24 hour  Intake 240 ml  Output --  Net 240 ml   Last 3 Weights 12/10/2019 12/10/2019 10/25/2019  Weight (lbs) 198 lb 13.7 oz 205 lb 210 lb  Weight (kg) 90.2 kg 92.987 kg 95.255 kg     Body mass index is 32.1 kg/m.  General:  Well nourished, well developed, in no acute distress HEENT: normal Lymph: no adenopathy Neck: no JVD Endocrine:  No thryomegaly Vascular: No carotid bruits; FA pulses 2+ bilaterally without bruits  Cardiac:  normal S1, S2; RR, tachycardia; no murmur  Lungs:  clear to auscultation bilaterally, no wheezing, rhonchi or rales  Abd: soft, nontender, no hepatomegaly  Ext: no edema Musculoskeletal:  No deformities, BUE and BLE strength normal and equal Skin: warm and dry  Neuro:  CNs 2-12 intact, no focal abnormalities noted Psych:  Normal affect   EKG:  The EKG was personally reviewed and demonstrates:  Sinus  tachycardia, low voltage, 107 bpm, poor R wave progression Telemetry:  Telemetry was personally reviewed and demonstrates:  Sinus tachycardia, HR 100-110  Relevant CV Studies:  Echo 12/13/19 1. Left ventricular ejection fraction, by estimation, is 55 to 60%. The  left ventricle has normal function. The left ventricle has no regional  wall motion abnormalities. There is mild left ventricular hypertrophy.  Indeterminate diastolic filling due to  E-A fusion.  2. Right ventricular systolic function is normal. The right ventricular  size is normal. Tricuspid regurgitation signal is inadequate for assessing  PA pressure.  3. Left atrial size was mildly dilated.  4. Moderate to large pericardial effusion. The pericardial effusion is  anterior to the right ventricle and surrounding the apex. There is no  evidence of cardiac tamponade. No RV diastolic collapse, IVC appears small  and collapsible. Respirometer  evaluation not performed.  5. The mitral valve is normal in structure. No evidence of mitral valve  regurgitation. No evidence of mitral stenosis.  6. The aortic valve is tricuspid. Aortic valve regurgitation is mild. No  aortic stenosis is present.  7. The inferior vena cava is normal in size with greater than 50%  respiratory variability, suggesting right atrial pressure of 3 mmHg.   Echo 10/26/19 1. Left ventricular ejection fraction, by estimation, is 60 to 65%. The  left ventricle has normal function. The left ventricle has no regional  wall motion abnormalities. Left ventricular diastolic parameters were  normal.  2. Right ventricular systolic function is normal. The right ventricular  size is normal.  3. The mitral valve is normal in structure. No evidence of mitral valve  regurgitation. No evidence of mitral stenosis.  4. The aortic valve is normal in structure. Aortic valve regurgitation is  not visualized. No aortic stenosis is present.  5. The inferior vena cava  is normal in size with greater than 50%  respiratory variability, suggesting right atrial pressure of 3 mmHg.   Echo 09/22/19: small pericardial effusion  Echo 08/03/19: moderate pericardial effusion  Laboratory Data:  High Sensitivity Troponin:  No results for input(s): TROPONINIHS in the last 720 hours.   Chemistry Recent Labs  Lab 12/13/19 1555 12/14/19 0331 12/15/19 0439  NA 137 139 140  K 3.0* 3.4* 3.7  CL 106 109 110  CO2 18* 22 15*  GLUCOSE 123* 89 89  BUN 23* 21* 21*  CREATININE 1.52* 1.17* 1.25*  CALCIUM 6.6* 6.5* 6.8*  GFRNONAA 39* 54* 49*  GFRAA 45* >60 57*  ANIONGAP 13 8 15     Recent Labs  Lab 12/10/19 1422 12/11/19 1302  PROT 5.5* 4.9*  ALBUMIN 2.4* 2.2*  AST 22 26  ALT 16 15  ALKPHOS 88 91  BILITOT 0.7 1.1   Hematology Recent Labs  Lab 12/13/19 0322 12/14/19 0331 12/15/19 0439  WBC 1.8* 2.7* 5.2  RBC 3.41* 3.39* 3.60*  HGB 8.9* 8.8* 9.5*  HCT 28.1* 27.4* 29.3*  MCV 82.4 80.8 81.4  MCH 26.1 26.0 26.4  MCHC 31.7 32.1 32.4  RDW 18.8* 18.8* 19.5*  PLT 50* 71* 125*   BNPNo results for input(s): BNP, PROBNP in the last 168 hours.  DDimer No results for input(s): DDIMER in the last 168 hours.   Radiology/Studies:  ECHOCARDIOGRAM COMPLETE  Result Date: 12/13/2019    ECHOCARDIOGRAM REPORT   Patient Name:   Melissa Hurley Date of Exam: 12/13/2019 Medical Rec #:  [id number]       Height:       66.0 in Accession #:    [id number]      Weight:       198.9 lb Date of Birth:  08-Feb-1966      BSA:          1.995 m Patient Age:    57 years        BP:           112/68 mmHg Patient Gender: F               HR:           100 bpm. Exam Location:  Inpatient Procedure: 2D Echo and Intracardiac Opacification Agent Indications:    Corynebacteria Bacteremia 790.7 / R78.81  History:        Patient has prior history of Echocardiogram examinations, most                 recent 10/26/2019. Stroke. Metastatic intrahepatic                 cholangiocarcinoma, Pericardial effusion,  Sinus Tachycardia.  Sonographer:    Darlina Sicilian RDCS Referring Phys: [id number] CORNELIUS N VAN DAM  Sonographer Comments: Suboptimal apical window. IMPRESSIONS  1. Left ventricular ejection fraction, by estimation, is 55 to 60%. The left ventricle has normal function. The left ventricle has no regional wall motion abnormalities. There is mild left ventricular hypertrophy. Indeterminate diastolic filling due to E-A fusion.  2. Right ventricular systolic function is normal. The right ventricular size is normal. Tricuspid regurgitation signal is inadequate for assessing PA pressure.  3. Left atrial size was mildly dilated.  4. Moderate to large pericardial effusion. The pericardial effusion is anterior to the right ventricle and surrounding the apex. There is no evidence of cardiac tamponade. No RV diastolic collapse, IVC appears small and collapsible. Respirometer evaluation not performed.  5. The mitral valve is normal in structure. No evidence of mitral valve regurgitation. No evidence of mitral stenosis.  6. The aortic valve is tricuspid. Aortic valve regurgitation is  mild. No aortic stenosis is present.  7. The inferior vena cava is normal in size with greater than 50% respiratory variability, suggesting right atrial pressure of 3 mmHg. Conclusion(s)/Recommendation(s): No evidence of valvular vegetations on this transthoracic echocardiogram. Would recommend a transesophageal echocardiogram to exclude infective endocarditis if clinically indicated. FINDINGS  Left Ventricle: Left ventricular ejection fraction, by estimation, is 55 to 60%. The left ventricle has normal function. The left ventricle has no regional wall motion abnormalities. Definity contrast agent was given IV to delineate the left ventricular  endocardial borders. The left ventricular internal cavity size was normal in size. There is mild left ventricular hypertrophy. Indeterminate diastolic filling due to E-A fusion. Right Ventricle: The right  ventricular size is normal. No increase in right ventricular wall thickness. Right ventricular systolic function is normal. Tricuspid regurgitation signal is inadequate for assessing PA pressure. Left Atrium: Left atrial size was mildly dilated. Right Atrium: Right atrial size was normal in size. Pericardium: Moderate to large pericardial effusion. The pericardial effusion is anterior to the right ventricle and surrounding the apex. There is no evidence of cardiac tamponade. Mitral Valve: The mitral valve is normal in structure. Normal mobility of the mitral valve leaflets. No evidence of mitral valve regurgitation. No evidence of mitral valve stenosis. Tricuspid Valve: The tricuspid valve is normal in structure. Tricuspid valve regurgitation is not demonstrated. No evidence of tricuspid stenosis. Aortic Valve: The aortic valve is tricuspid. Aortic valve regurgitation is mild. No aortic stenosis is present. Pulmonic Valve: The pulmonic valve was normal in structure. Pulmonic valve regurgitation is not visualized. No evidence of pulmonic stenosis. Aorta: The aortic root is normal in size and structure. Venous: The inferior vena cava is normal in size with greater than 50% respiratory variability, suggesting right atrial pressure of 3 mmHg. IAS/Shunts: No atrial level shunt detected by color flow Doppler.  LEFT VENTRICLE PLAX 2D LVIDd:         3.40 cm     Diastology LVIDs:         2.40 cm     LV e' lateral:   4.46 cm/s LV PW:         1.00 cm     LV E/e' lateral: 12.5 LV IVS:        1.10 cm     LV e' medial:    5.22 cm/s LVOT diam:     2.00 cm     LV E/e' medial:  10.7 LV SV:         34 LV SV Index:   17 LVOT Area:     3.14 cm  LV Volumes (MOD) LV vol d, MOD A2C: 78.6 ml LV vol d, MOD A4C: 93.2 ml LV vol s, MOD A2C: 38.6 ml LV vol s, MOD A4C: 50.6 ml LV SV MOD A2C:     40.0 ml LV SV MOD A4C:     93.2 ml LV SV MOD BP:      40.3 ml RIGHT VENTRICLE RV S prime:     27.60 cm/s LEFT ATRIUM           Index       RIGHT ATRIUM           Index LA diam:      2.60 cm 1.30 cm/m  RA Area:     9.78 cm LA Vol (A2C): 28.5 ml 14.29 ml/m RA Volume:   17.25 ml 8.65 ml/m LA Vol (A4C): 74.1 ml 37.14 ml/m  AORTIC VALVE LVOT Vmax:  75.00 cm/s LVOT Vmean:  54.700 cm/s LVOT VTI:    0.108 m  AORTA Ao Root diam: 3.00 cm MITRAL VALVE MV Area (PHT): 6.43 cm    SHUNTS MV Decel Time: 118 msec    Systemic VTI:  0.11 m MV E velocity: 55.60 cm/s  Systemic Diam: 2.00 cm MV A velocity: 98.20 cm/s MV E/A ratio:  0.57 Cherlynn Kaiser MD Electronically signed by Cherlynn Kaiser MD Signature Date/Time: 12/13/2019/7:29:39 PM    Final    {   Assessment and Plan:   Pericardial effusion, recurrent - Likely secondary to metastatic biliary cancer - In December 2020 effusion was moderate with no tamponade. Repeat imaging 10/26/19 showed minimal pericardial effusion, improved from prior - Echo this admission with mod to large effusion with no signs of tamponade - On exam patient is tachycardic but this appears to be chronic for patient. She was previously started on Lorpessor for tachycardia but has not been taking it.  - No JVD seen on exam. Most recent BP 117/89 - Patient denies chest pain, sob, LLE, orthopnea - EKG with sinus tach and low voltage - Consider limited echo in a couple days to monitor progression - Appears oncology recommending palliative consult but patient is reluctant to this, she would like further tx. If continues to reoccur might need pericardial window - Md to see  SIRS/Bacteremia - ID following - port removed today  Stage IV metastatic intrahepatic cholangiocarcinoma s/p chemo with pancytopenia - s/p 2 units PRBC and 1 platelet transfusion - Oncology consulted. Had originally set up palliative care consult as outpatient  AKI on CKD stage 2 - creatinine 1.25 today. 1.49 on admission  Recent CVA - Eliquis on hold for thrombocytopenia  For questions or updates, please contact [place] Please consult [email or web address]  for contact info under     Signed, Cadence Ninfa Meeker, PA-C  12/15/2019 2:15 PM   Patient seen and examined. Agree with assessment and plan.  Melissa Hurley is a very pleasant 54 year old African-American female who has a history of stage IV metastatic intrahepatic cholangiocarcinoma followed at [facility].  She has received chemotherapy.  She has been documented to have a suspected malignant pericardial effusion and initially was seen by Dr. Curt Bears in December 2021.  At that time she did not have any signs of cardiac tamponade.  In March she was hospitalized due to left MCA CVA and initiated therapy with Eliquis.  She presented this admission with fever nausea vomiting reduced oral intake and had significant metabolic disarray with hypokalemia, hypomagnesemia, hypophosphatemia, and was febrile subsequently found to have chronic bacteremia.  In addition she had pancytopenia.  She was significantly thrombocytopenic on presentation with platelet counts at 26,000 and hemoglobin at 8.4 hematocrit 25.7.  She has received 2 units packed red blood cell transfusion and platelet transfusion.  Port-A-Cath was removed today.  Her present temperature is 98.3.  She is feeling improved.  She specifically denies chest pain or pleuritic chest pain.  I personally took her blood pressure with a cuff which was 126/86 and there was no evidence for pulses paradoxus.  She does not have a jugular venous distention.  Her lungs are clear.  Rhythm is mildly tachycardic and regular.  I did not appreciate a friction rub.  There was a faint systolic murmur.  She did not have hepatojugular reflux.  There was no significant lower extremity edema.  Personally reviewed her echo Doppler images.  She does not have a large circumferential pericardial effusion and  her pericardial effusion is more localized to the anterior RV and apex and most likely is moderate.  There are no echocardiographic findings of cardiac tamponade.  There is no RV  collapse.  She has normal resiratory variability and the IVC is small and collapsible.  Agree with discontinuance of Eliquis particularly with her profound thrombocytopenia on presentation and potential risk for conversion to a hemorrhagic effusion.  At present I do not feel she requires pericardiocentesis.  If she continues to have fevers despite removal of her portacath and with the high likelihood for malignant effusion a pericardial window/biopsy may be considered.   Troy Sine, MD, [facility] 12/15/2019 4:33 PM

## 2019-12-15 NOTE — Progress Notes (Signed)
Subjective:  No new complaint   Antibiotics:  Anti-infectives (From admission, onward)   Start     Dose/Rate Route Frequency Ordered Stop   12/15/19 1226  ceFAZolin (ANCEF) 2-4 GM/100ML-% IVPB    Note to Pharmacy: Desiree Hane   : cabinet override      12/15/19 1226 12/16/19 0029   12/13/19 1100  vancomycin (VANCOCIN) IVPB 1000 mg/200 mL premix     1,000 mg 200 mL/hr over 60 Minutes Intravenous Every 24 hours 12/12/19 0952     12/13/19 0000  vancomycin (VANCOCIN) IVPB 1000 mg/200 mL premix  Status:  Discontinued     1,000 mg 200 mL/hr over 60 Minutes Intravenous Every 24 hours 12/12/19 0948 12/12/19 0952   12/12/19 1400  piperacillin-tazobactam (ZOSYN) IVPB 3.375 g  Status:  Discontinued     3.375 g 12.5 mL/hr over 240 Minutes Intravenous Every 8 hours 12/12/19 0754 12/12/19 0946   12/12/19 1100  vancomycin (VANCOREADY) IVPB 1750 mg/350 mL     1,750 mg 175 mL/hr over 120 Minutes Intravenous  Once 12/12/19 0951 12/12/19 1453   12/12/19 1100  meropenem (MERREM) 1 g in sodium chloride 0.9 % 100 mL IVPB  Status:  Discontinued     1 g 200 mL/hr over 30 Minutes Intravenous Every 8 hours 12/12/19 0952 12/13/19 0858   12/12/19 1000  vancomycin (VANCOREADY) IVPB 1750 mg/350 mL  Status:  Discontinued     1,750 mg 175 mL/hr over 120 Minutes Intravenous  Once 12/12/19 0948 12/12/19 0951   12/12/19 1000  meropenem (MERREM) 1 g in sodium chloride 0.9 % 100 mL IVPB  Status:  Discontinued     1 g 200 mL/hr over 30 Minutes Intravenous Every 8 hours 12/12/19 0948 12/12/19 0952   12/12/19 0745  piperacillin-tazobactam (ZOSYN) IVPB 3.375 g     3.375 g 100 mL/hr over 30 Minutes Intravenous  Once 12/12/19 0732 12/12/19 0918   12/12/19 0100  ceFEPIme (MAXIPIME) 2 g in sodium chloride 0.9 % 100 mL IVPB  Status:  Discontinued     2 g 200 mL/hr over 30 Minutes Intravenous Every 12 hours 12/11/19 1218 12/12/19 0732   12/11/19 1100  ceFEPIme (MAXIPIME) 2 g in sodium chloride 0.9 % 100 mL  IVPB     2 g 200 mL/hr over 30 Minutes Intravenous NOW 12/11/19 1029 12/11/19 1230      Medications: Scheduled Meds: . sodium chloride   Intravenous Once  . Chlorhexidine Gluconate Cloth  6 each Topical Daily  . feeding supplement (ENSURE ENLIVE)  237 mL Oral TID BM  . fentaNYL      . lidocaine      . midazolam      . sodium chloride flush  10-40 mL Intracatheter Q12H  . sodium chloride flush  3 mL Intravenous Once  . sodium chloride flush  3 mL Intravenous Q12H   Continuous Infusions: . ceFAZolin    . lactated ringers 100 mL/hr (12/11/19 1831)  . vancomycin 1,000 mg (12/14/19 1235)   PRN Meds:.acetaminophen **OR** acetaminophen, fentaNYL, LORazepam, metoprolol tartrate, midazolam, ondansetron (ZOFRAN) IV, sodium chloride flush    Objective: Weight change:   Intake/Output Summary (Last 24 hours) at 12/15/2019 1340 Last data filed at 12/14/2019 2000 Gross per 24 hour  Intake 240 ml  Output --  Net 240 ml   Blood pressure 122/67, pulse (!) 103, temperature 98.7 F (37.1 C), temperature source Oral, resp. rate (!) 23, height 5\' 6"  (1.676 m), weight 90.2 kg,  last menstrual period 10/25/2014, SpO2 100 %. Temp:  [97.7 F (36.5 C)-98.9 F (37.2 C)] 98.7 F (37.1 C) (04/28 0736) Pulse Rate:  [92-108] 103 (04/28 1335) Resp:  [20-24] 23 (04/28 1335) BP: (102-140)/(53-106) 122/67 (04/28 1322) SpO2:  [62 %-100 %] 100 % (04/28 1322)  Physical Exam: General: Alert and awake, oriented x3, not in any acute distress. HEENT: anicteric sclera, EOMI CVS regular rate, normal no murmurs gallops rubs heard Chest: , no wheezing, no respiratory distress Abdomen: soft non-distended,  Extremities: no edema or deformity noted bilaterally Skin: no rashes Neuro: nonfocal Port still in place and with some bruising CBC:    BMET Recent Labs    12/14/19 0331 12/15/19 0439  NA 139 140  K 3.4* 3.7  CL 109 110  CO2 22 15*  GLUCOSE 89 89  BUN 21* 21*  CREATININE 1.17* 1.25*  CALCIUM  6.5* 6.8*     Liver Panel  No results for input(s): PROT, ALBUMIN, AST, ALT, ALKPHOS, BILITOT, BILIDIR, IBILI in the last 72 hours.     Sedimentation Rate No results for input(s): ESRSEDRATE in the last 72 hours. C-Reactive Protein No results for input(s): CRP in the last 72 hours.  Micro Results: Recent Results (from the past 720 hour(s))  Blood culture (routine x 2)     Status: Abnormal   Collection Time: 12/10/19  3:28 PM   Specimen: BLOOD  Result Value Ref Range Status   Specimen Description BLOOD SITE NOT SPECIFIED  Final   Special Requests   Final    BOTTLES DRAWN AEROBIC AND ANAEROBIC Blood Culture adequate volume   Culture  Setup Time   Final    GRAM POSITIVE RODS AEROBIC BOTTLE ONLY CRITICAL RESULT CALLED TO, READ BACK BY AND VERIFIED WITH: L. CURRAN,PHARMD 2127 12/11/2019 T. TYSOR    Culture (A)  Final    DIPHTHEROIDS(CORYNEBACTERIUM SPECIES) Standardized susceptibility testing for this organism is not available. Performed at [facility], [place] [address] [place], [place] [address]    Report Status 12/12/2019 FINAL  Final  Respiratory Panel by RT PCR (Flu A&B, Covid) - Nasopharyngeal Swab     Status: None   Collection Time: 12/10/19  6:03 PM   Specimen: Nasopharyngeal Swab  Result Value Ref Range Status   SARS Coronavirus 2 by RT PCR NEGATIVE NEGATIVE Final    Comment: (NOTE) SARS-CoV-2 target nucleic acids are NOT DETECTED. The SARS-CoV-2 RNA is generally detectable in upper respiratoy specimens during the acute phase of infection. The lowest concentration of SARS-CoV-2 viral copies this assay can detect is 131 copies/mL. A negative result does not preclude SARS-Cov-2 infection and should not be used as the sole basis for treatment or other patient management decisions. A negative result may occur with  improper specimen collection/handling, submission of specimen other than nasopharyngeal swab, presence of viral mutation(s) within the areas  targeted by this assay, and inadequate number of viral copies (<131 copies/mL). A negative result must be combined with clinical observations, patient history, and epidemiological information. The expected result is Negative. Fact Sheet for Patients:  PinkCheek.be Fact Sheet for Healthcare Providers:  GravelBags.it This test is not yet ap proved or cleared by the [place] FDA and  has been authorized for detection and/or diagnosis of SARS-CoV-2 by FDA under an Emergency Use Authorization (EUA). This EUA will remain  in effect (meaning this test can be used) for the duration of the COVID-19 declaration under Section 564(b)(1) of the Act, 21 U.S.C. section 360bbb-3(b)(1), unless the  authorization is terminated or revoked sooner.    Influenza A by PCR NEGATIVE NEGATIVE Final   Influenza B by PCR NEGATIVE NEGATIVE Final    Comment: (NOTE) The Xpert Xpress SARS-CoV-2/FLU/RSV assay is intended as an aid in  the diagnosis of influenza from Nasopharyngeal swab specimens and  should not be used as a sole basis for treatment. Nasal washings and  aspirates are unacceptable for Xpert Xpress SARS-CoV-2/FLU/RSV  testing. Fact Sheet for Patients: PinkCheek.be Fact Sheet for Healthcare Providers: GravelBags.it This test is not yet approved or cleared by the [place] FDA and  has been authorized for detection and/or diagnosis of SARS-CoV-2 by  FDA under an Emergency Use Authorization (EUA). This EUA will remain  in effect (meaning this test can be used) for the duration of the  Covid-19 declaration under Section 564(b)(1) of the Act, 21  U.S.C. section 360bbb-3(b)(1), unless the authorization is  terminated or revoked. Performed at [facility], [place] [address], [place], [place] [address]   Blood culture (routine x 2)     Status: Abnormal   Collection Time: 12/10/19   8:47 PM   Specimen: BLOOD  Result Value Ref Range Status   Specimen Description BLOOD SITE NOT SPECIFIED  Final   Special Requests   Final    BOTTLES DRAWN AEROBIC AND ANAEROBIC Blood Culture results may not be optimal due to an inadequate volume of blood received in culture bottles   Culture  Setup Time   Final    GRAM POSITIVE RODS AEROBIC BOTTLE ONLY CRITICAL RESULT CALLED TO, READ BACK BY AND VERIFIED WITH: L. CURRAN,PHARMD 2127 12/11/2019 T. TYSOR    Culture (A)  Final    CORYNEBACTERIUM, GROUP JK Standardized susceptibility testing for this organism is not available. Performed at [facility], [place] [address], [place], [place] [address]    Report Status 12/12/2019 FINAL  Final  C Difficile Quick Screen w PCR reflex     Status: None   Collection Time: 12/11/19 10:12 AM   Specimen: STOOL  Result Value Ref Range Status   C Diff antigen NEGATIVE NEGATIVE Final   C Diff toxin NEGATIVE NEGATIVE Final   C Diff interpretation No C. difficile detected.  Final    Comment: Performed at [facility], [place] [address] [place], [place] [address]  Gastrointestinal Panel by PCR , Stool     Status: None   Collection Time: 12/11/19 10:12 AM   Specimen: Stool  Result Value Ref Range Status   Campylobacter species NOT DETECTED NOT DETECTED Final   Plesimonas shigelloides NOT DETECTED NOT DETECTED Final   Salmonella species NOT DETECTED NOT DETECTED Final   Yersinia enterocolitica NOT DETECTED NOT DETECTED Final   Vibrio species NOT DETECTED NOT DETECTED Final   Vibrio cholerae NOT DETECTED NOT DETECTED Final   Enteroaggregative E coli (EAEC) NOT DETECTED NOT DETECTED Final   Enteropathogenic E coli (EPEC) NOT DETECTED NOT DETECTED Final   Enterotoxigenic E coli (ETEC) NOT DETECTED NOT DETECTED Final   Shiga like toxin producing E coli (STEC) NOT DETECTED NOT DETECTED Final   Shigella/Enteroinvasive E coli (EIEC) NOT DETECTED NOT DETECTED Final   Cryptosporidium NOT DETECTED NOT  DETECTED Final   Cyclospora cayetanensis NOT DETECTED NOT DETECTED Final   Entamoeba histolytica NOT DETECTED NOT DETECTED Final   Giardia lamblia NOT DETECTED NOT DETECTED Final   Adenovirus F40/41 NOT DETECTED NOT DETECTED Final   Astrovirus NOT DETECTED NOT DETECTED Final   Norovirus GI/GII NOT  DETECTED NOT DETECTED Final   Rotavirus A NOT DETECTED NOT DETECTED Final   Sapovirus (I, II, IV, and V) NOT DETECTED NOT DETECTED Final    Comment: Performed at [facility], [address] [place], [place] [address]  Culture, blood (x 2)     Status: Abnormal   Collection Time: 12/12/19  7:39 AM   Specimen: BLOOD  Result Value Ref Range Status   Specimen Description BLOOD SITE NOT SPECIFIED  Final   Special Requests   Final    BOTTLES DRAWN AEROBIC ONLY Blood Culture results may not be optimal due to an inadequate volume of blood received in culture bottles   Culture  Setup Time   Final    AEROBIC BOTTLE ONLY GRAM POSITIVE RODS CRITICAL VALUE NOTED.  VALUE IS CONSISTENT WITH PREVIOUSLY REPORTED AND CALLED VALUE.    Culture (A)  Final    CORYNEBACTERIUM, GROUP JK Standardized susceptibility testing for this organism is not available. Performed at [facility], [place] [address], [place], [place] [address]    Report Status 12/15/2019 FINAL  Final  Culture, blood (x 2)     Status: Abnormal   Collection Time: 12/12/19  7:53 AM   Specimen: BLOOD  Result Value Ref Range Status   Specimen Description BLOOD SITE NOT SPECIFIED  Final   Special Requests   Final    BOTTLES DRAWN AEROBIC ONLY Blood Culture results may not be optimal due to an inadequate volume of blood received in culture bottles   Culture  Setup Time   Final    AEROBIC BOTTLE ONLY GRAM POSITIVE RODS CRITICAL VALUE NOTED.  VALUE IS CONSISTENT WITH PREVIOUSLY REPORTED AND CALLED VALUE.    Culture (A)  Final    CORYNEBACTERIUM, GROUP JK Standardized susceptibility testing for this organism is not  available. Performed at [facility], [place] [address] [place], [place] [address]    Report Status 12/15/2019 FINAL  Final    Studies/Results: ECHOCARDIOGRAM COMPLETE  Result Date: 12/13/2019    ECHOCARDIOGRAM REPORT   Patient Name:   Melissa Hurley Date of Exam: 12/13/2019 Medical Rec #:  [id number]       Height:       66.0 in Accession #:    [id number]      Weight:       198.9 lb Date of Birth:  10-21-1965      BSA:          1.995 m Patient Age:    54 years        BP:           112/68 mmHg Patient Gender: F               HR:           100 bpm. Exam Location:  Inpatient Procedure: 2D Echo and Intracardiac Opacification Agent Indications:    Corynebacteria Bacteremia 790.7 / R78.81  History:        Patient has prior history of Echocardiogram examinations, most                 recent 10/26/2019. Stroke. Metastatic intrahepatic                 cholangiocarcinoma, Pericardial effusion, Sinus Tachycardia.  Sonographer:    Darlina Sicilian RDCS Referring Phys: [id number] Ingvald Theisen N VAN DAM  Sonographer Comments: Suboptimal apical window. IMPRESSIONS  1. Left ventricular ejection fraction, by estimation, is 55 to 60%. The left ventricle has normal  function. The left ventricle has no regional wall motion abnormalities. There is mild left ventricular hypertrophy. Indeterminate diastolic filling due to E-A fusion.  2. Right ventricular systolic function is normal. The right ventricular size is normal. Tricuspid regurgitation signal is inadequate for assessing PA pressure.  3. Left atrial size was mildly dilated.  4. Moderate to large pericardial effusion. The pericardial effusion is anterior to the right ventricle and surrounding the apex. There is no evidence of cardiac tamponade. No RV diastolic collapse, IVC appears small and collapsible. Respirometer evaluation not performed.  5. The mitral valve is normal in structure. No evidence of mitral valve regurgitation. No evidence of mitral stenosis.  6. The aortic valve  is tricuspid. Aortic valve regurgitation is mild. No aortic stenosis is present.  7. The inferior vena cava is normal in size with greater than 50% respiratory variability, suggesting right atrial pressure of 3 mmHg. Conclusion(s)/Recommendation(s): No evidence of valvular vegetations on this transthoracic echocardiogram. Would recommend a transesophageal echocardiogram to exclude infective endocarditis if clinically indicated. FINDINGS  Left Ventricle: Left ventricular ejection fraction, by estimation, is 55 to 60%. The left ventricle has normal function. The left ventricle has no regional wall motion abnormalities. Definity contrast agent was given IV to delineate the left ventricular  endocardial borders. The left ventricular internal cavity size was normal in size. There is mild left ventricular hypertrophy. Indeterminate diastolic filling due to E-A fusion. Right Ventricle: The right ventricular size is normal. No increase in right ventricular wall thickness. Right ventricular systolic function is normal. Tricuspid regurgitation signal is inadequate for assessing PA pressure. Left Atrium: Left atrial size was mildly dilated. Right Atrium: Right atrial size was normal in size. Pericardium: Moderate to large pericardial effusion. The pericardial effusion is anterior to the right ventricle and surrounding the apex. There is no evidence of cardiac tamponade. Mitral Valve: The mitral valve is normal in structure. Normal mobility of the mitral valve leaflets. No evidence of mitral valve regurgitation. No evidence of mitral valve stenosis. Tricuspid Valve: The tricuspid valve is normal in structure. Tricuspid valve regurgitation is not demonstrated. No evidence of tricuspid stenosis. Aortic Valve: The aortic valve is tricuspid. Aortic valve regurgitation is mild. No aortic stenosis is present. Pulmonic Valve: The pulmonic valve was normal in structure. Pulmonic valve regurgitation is not visualized. No evidence of  pulmonic stenosis. Aorta: The aortic root is normal in size and structure. Venous: The inferior vena cava is normal in size with greater than 50% respiratory variability, suggesting right atrial pressure of 3 mmHg. IAS/Shunts: No atrial level shunt detected by color flow Doppler.  LEFT VENTRICLE PLAX 2D LVIDd:         3.40 cm     Diastology LVIDs:         2.40 cm     LV e' lateral:   4.46 cm/s LV PW:         1.00 cm     LV E/e' lateral: 12.5 LV IVS:        1.10 cm     LV e' medial:    5.22 cm/s LVOT diam:     2.00 cm     LV E/e' medial:  10.7 LV SV:         34 LV SV Index:   17 LVOT Area:     3.14 cm  LV Volumes (MOD) LV vol d, MOD A2C: 78.6 ml LV vol d, MOD A4C: 93.2 ml LV vol s, MOD A2C: 38.6 ml LV vol s,  MOD A4C: 50.6 ml LV SV MOD A2C:     40.0 ml LV SV MOD A4C:     93.2 ml LV SV MOD BP:      40.3 ml RIGHT VENTRICLE RV S prime:     27.60 cm/s LEFT ATRIUM           Index       RIGHT ATRIUM          Index LA diam:      2.60 cm 1.30 cm/m  RA Area:     9.78 cm LA Vol (A2C): 28.5 ml 14.29 ml/m RA Volume:   17.25 ml 8.65 ml/m LA Vol (A4C): 74.1 ml 37.14 ml/m  AORTIC VALVE LVOT Vmax:   75.00 cm/s LVOT Vmean:  54.700 cm/s LVOT VTI:    0.108 m  AORTA Ao Root diam: 3.00 cm MITRAL VALVE MV Area (PHT): 6.43 cm    SHUNTS MV Decel Time: 118 msec    Systemic VTI:  0.11 m MV E velocity: 55.60 cm/s  Systemic Diam: 2.00 cm MV A velocity: 98.20 cm/s MV E/A ratio:  0.57 Cherlynn Kaiser MD Electronically signed by Cherlynn Kaiser MD Signature Date/Time: 12/13/2019/7:29:39 PM    Final       Assessment/Plan:  INTERVAL HISTORY: Port-A-Cath to be removed today Principal Problem:   Pancytopenia due to chemotherapy [facility]) Active Problems:   Nausea & vomiting   Dehydration   AKI (acute kidney injury) ([place])   SIRS (systemic inflammatory response syndrome) (HCC)   Metastatic cholangiocarcinoma to bone (HCC)   History of CVA (cerebrovascular accident)   Hypokalemia   Hypocalcemia    Melissa Hurley is a 54 y.o.  female with a static cholangiocarcinoma with port in place receiving chemotherapy now with corynebacterium bacteremia. Transthoracic echocardiogram does not show evidence of endocarditis  1. Corynebacterium bacteremia due to port:  Port needs to be removed  I would then get blood cultures after port has been removed, should have a "line holiday"  Continue vancomycin  2. Pericardial effusion: He points out that she had this before though it was described as being minimal in March where is now is characterized as a moderate to large pericardial effusion.  Would ask Cardiology whether they think this could be a purulent pericarditis or they are confident it is not.  Does she need diagnostic pericardiocentesis for example      LOS: 5 days   Alcide Evener 12/15/2019, 1:40 PM

## 2019-12-15 NOTE — Progress Notes (Signed)
Pharmacy Antibiotic Note  Melissa Hurley is a 54 y.o. female admitted on 12/10/2019 with sepsis.  Pharmacy has been consulted for vancomycin dosing.  Pt is afebrile, WBC uptrended to wnl at 5.2. Scr 1.25. Continuing vancomycin. Port removal in process. Noted pericardial effusion; possible cardiology workup.  Plan: Vancomycin 1 g Q24H. Order vanc levels at steady state tomorrow.   Height: 5\' 6"  (167.6 cm) Weight: 90.2 kg (198 lb 13.7 oz) IBW/kg (Calculated) : 59.3  Temp (24hrs), Avg:98.4 F (36.9 C), Min:97.7 F (36.5 C), Max:98.9 F (37.2 C)  Recent Labs  Lab 12/10/19 1528 12/10/19 1530 12/10/19 1711 12/10/19 2048 12/11/19 1302 12/11/19 1302 12/12/19 0739 12/12/19 0739 12/12/19 1555 12/13/19 0322 12/13/19 1555 12/14/19 0331 12/15/19 0439  WBC  --    < >   < >  --  1.5*  --  1.3*  --   --  1.8*  --  2.7* 5.2  CREATININE  --   --    < >  --  1.56*   < > 1.29*   < > 1.43* 1.31* 1.52* 1.17* 1.25*  LATICACIDVEN 2.9*  --   --  2.2* 2.3*  --  1.2  --   --   --   --   --   --    < > = values in this interval not displayed.    Estimated Creatinine Clearance: 59.6 mL/min (A) (by C-G formula based on SCr of 1.25 mg/dL (H)).    No Known Allergies  Cefepime 4/24>> 4/25 Zosyn x1 4/25 Merrem 4/25>>4/26 Vanc 4/25>>  4/23: BC x 2: Corynebacterium, Group JK in 2 out of 4 bottles. 4/24: Cdiff negative 4/23: COVID negative, Flu negative  Lenon Oms  Student-PharmD 12/15/2019 1:57 PM

## 2019-12-15 NOTE — Progress Notes (Signed)
SLP Cancellation Note  Patient Details Name: Melissa Hurley MRN: [id number] DOB: 1966-05-27   Cancelled treatment:       Reason Eval/Treat Not Completed: Patient at procedure or test/unavailable. Pt NPO for procedure. Will f/u when ready for PO intake.  Herbie Baltimore, MA CCC-SLP  Acute Rehabilitation Services Pager [phone] Office [phone]   Lynann Beaver 12/15/2019, 9:40 AM

## 2019-12-15 NOTE — Progress Notes (Addendum)
PROGRESS NOTE  Melissa Hurley GVS:[id number] DOB: 1966-07-24 DOA: 12/10/2019 PCP: Patient, No Pcp Per  Brief hospital course: Past medical history of, metastatic intrahepatic cholangiocarcinoma, followed at [facility] and currently undergoing tx cisplatin/gemcitabine, who was recently hospitalized in 10/2019 due to acute left MCA CVA and started on eliquis presents withnausea, vomiting, poor p.o. intake.  Found to have hypokalemia, hypomagnesemia, hypophosphatemia as well as pancytopenia with undetectable platelets.     HPI/Recap of past 24 hours:  Denies pain, no fever, some dysarthria, but able to talk better with slow pace  Reports having diarrhea when she is getting iv abx infusion, denies ab pain, no n/v  No fever  She is n.p.o. awaiting to have port removed today  Assessment/Plan: Principal Problem:   Pancytopenia due to chemotherapy [facility]) Active Problems:   Nausea & vomiting   Dehydration   AKI (acute kidney injury) ([place])   SIRS (systemic inflammatory response syndrome) (HCC)   Metastatic cholangiocarcinoma to bone (HCC)   History of CVA (cerebrovascular accident)   Hypokalemia   Hypocalcemia  Bacteremia/febrile neutropenia/sepsis on presentation with fever 102.7, tachycardia, tachypnea, lactic acidosis Improving He is scheduled to have port removed today Infections disease input appreciated, will follow recommendation   Moderate to large pericardial effusion. The pericardial effusion is anterior to the right ventricle and surrounding the apex.  There is no evidence of cardiac tamponade.  Cardiology consult requested  Addendum: 4:25pm Nonsustained SVT Keep K above 4 and mag above 2 Start Lopressor with holding parameters Keep on telemetry Cardiology now consulted  Stage IV metastatic intrahepatic cholangiocarcinoma , present with pancytopenia, post chemo Present generalized fatigue found to have hemoglobin 6.5, platelets undetectable,  neutropenia. EDP discussed with hematology on-call Dr. Irene Limbo who recommended supportive measures with transfusion. Patient has received 2 PRBC and 1 platelet transfusion. Platelets improved, hemoglobin remained stable. No active bleeding. WBC and platelet improving, hemoglobin stable Patient desired to switch oncology care back to cancer center here, I have discussed case with Dr. Lorenso Courier who will consult.  Report nausea and vomiting at home None in the hospital  Report poor oral intake at home due to loss of appetite denies of dysphagia She was observed choking on pills, hold off oral medication, change to IV medication, aspiration precaution, speech eval  Diarrhea Report associated with IV antibiotics C. difficile negative, GI PCR panel negative Supportive care  Hypokalemia, continue replace K, check mag  AKI on CKD 2 Creatinine fluctuating, renal dosing meds  Recent left MCA CVA with dysarthria Started on Eliquis. In the setting of thrombocytopenia currently holding all medications. Platelet improved, likely able to resume Eliquis in 24-48 hrs.     FTT, Poor prognosis, it appeared that her oncologist set up palliative care visit on 4/15, she currently declined palliative care while she is in the hospital  DVT Prophylaxis: SCDs  Code Status: Full  Family Communication: patient   Disposition Plan:    Patient came from:                         home  Anticipated d/c place: she would like to set up home health services   Barriers to d/c OR conditions which need to be met to effect a safe d/c:  Treating bacteremia   Consultants:  ID  IR  Hematology oncology  Procedures:  remove Port-A-Cath  Antibiotics:  IV Vanco   Objective: BP (!) 115/53 (BP Location: Left Arm)   Pulse (!) 103   Temp 98.7 F (37.1 C) (Oral)   Resp 20   Ht _0  (1.676 m)   Wt 90.2 kg   LMP 10/25/2014    SpO2 99%   BMI 32.10 kg/m   Intake/Output Summary (Last 24 hours) at 12/15/2019 1317 Last data filed at 12/14/2019 2000 Gross per 24 hour  Intake 240 ml  Output --  Net 240 ml   Filed Weights   12/10/19 1430 12/10/19 2222  Weight: 93 kg 90.2 kg    Exam: Patient is examined daily including today on 12/15/2019, exams remain the same as of yesterday except that has changed    General:  Weak, flat affect, NAD  Cardiovascular: RRR  Respiratory: CTABL  Abdomen: Soft/ND/NT, positive BS  Musculoskeletal: No Edema  Neuro: alert, oriented x3, slowed speech  Data Reviewed: Basic Metabolic Panel: Recent Labs  Lab 12/11/19 1302 12/11/19 1302 12/12/19 0739 12/12/19 0739 12/12/19 1555 12/13/19 0322 12/13/19 1555 12/14/19 0331 12/15/19 0439  NA 136   < > 137   < > 137 137 137 139 140  K 3.2*   < > 3.1*   < > 3.1* 3.5 3.0* 3.4* 3.7  CL 106   < > 109   < > 109 109 106 109 110  CO2 21*   < > 17*   < > 17* 20* 18* 22 15*  GLUCOSE 143*   < > 90   < > 122* 95 123* 89 89  BUN 21*   < > 19   < > 22* 21* 23* 21* 21*  CREATININE 1.56*   < > 1.29*   < > 1.43* 1.31* 1.52* 1.17* 1.25*  CALCIUM 5.7*   < > 5.6*   < > 5.9* 6.0* 6.6* 6.5* 6.8*  MG 1.2*   < > 1.6*   < > 2.2 2.0 1.9 1.8 1.7  PHOS <1.0*  --  2.0*  --   --  2.4*  --  2.8 3.0   < > = values in this interval not displayed.   Liver Function Tests: Recent Labs  Lab 12/10/19 1422 12/11/19 1302  AST 22 26  ALT 16 15  ALKPHOS 88 91  BILITOT 0.7 1.1  PROT 5.5* 4.9*  ALBUMIN 2.4* 2.2*   Recent Labs  Lab 12/10/19 1422  LIPASE 19   No results for input(s): AMMONIA in the last 168 hours. CBC: Recent Labs  Lab 12/10/19 1711 12/10/19 1808 12/11/19 1302 12/12/19 0739 12/13/19 0322 12/14/19 0331 12/15/19 0439  WBC 1.4*   < > 1.5* 1.3* 1.8* 2.7* 5.2  NEUTROABS 0.7*  --  0.5*  --  0.2* 0.3* 1.1*  HGB 6.4*   < > 8.7* 8.4* 8.9* 8.8* 9.5*  HCT 20.0*   < > 26.5* 25.7* 28.1* 27.4* 29.3*  MCV 81.3   < > 81.3 82.6 82.4 80.8  81.4  PLT <5*   < > 26* 28* 50* 71* 125*   < > = values in this interval not displayed.   Cardiac Enzymes:   No results for input(s): CKTOTAL, CKMB, CKMBINDEX, TROPONINI in the last 168 hours.  BNP (last 3 results) No results for input(s): BNP in the last 8760 hours.  ProBNP (last 3 results) No results for input(s): PROBNP in the last 8760 hours.  CBG: No results for input(s): GLUCAP in the last 168 hours.  Recent Results (from the past 240 hour(s))  Blood culture (routine x 2)     Status: Abnormal   Collection Time: 12/10/19  3:28 PM   Specimen: BLOOD  Result Value Ref Range Status   Specimen Description BLOOD SITE NOT SPECIFIED  Final   Special Requests   Final    BOTTLES DRAWN AEROBIC AND ANAEROBIC Blood Culture adequate volume   Culture  Setup Time   Final    GRAM POSITIVE RODS AEROBIC BOTTLE ONLY CRITICAL RESULT CALLED TO, READ BACK BY AND VERIFIED WITH: L. CURRAN,PHARMD 2127 12/11/2019 T. TYSOR    Culture (A)  Final    DIPHTHEROIDS(CORYNEBACTERIUM SPECIES) Standardized susceptibility testing for this organism is not available. Performed at [facility], [place] [address] [place], [place] [address]    Report Status 12/12/2019 FINAL  Final  Respiratory Panel by RT PCR (Flu A&B, Covid) - Nasopharyngeal Swab     Status: None   Collection Time: 12/10/19  6:03 PM   Specimen: Nasopharyngeal Swab  Result Value Ref Range Status   SARS Coronavirus 2 by RT PCR NEGATIVE NEGATIVE Final    Comment: (NOTE) SARS-CoV-2 target nucleic acids are NOT DETECTED. The SARS-CoV-2 RNA is generally detectable in upper respiratoy specimens during the acute phase of infection. The lowest concentration of SARS-CoV-2 viral copies this assay can detect is 131 copies/mL. A negative result does not preclude SARS-Cov-2 infection and should not be used as the sole basis for treatment or other patient management decisions. A negative result may occur with  improper specimen collection/handling,  submission of specimen other than nasopharyngeal swab, presence of viral mutation(s) within the areas targeted by this assay, and inadequate number of viral copies (<131 copies/mL). A negative result must be combined with clinical observations, patient history, and epidemiological information. The expected result is Negative. Fact Sheet for Patients:  PinkCheek.be Fact Sheet for Healthcare Providers:  GravelBags.it This test is not yet ap proved or cleared by the [place] FDA and  has been authorized for detection and/or diagnosis of SARS-CoV-2 by FDA under an Emergency Use Authorization (EUA). This EUA will remain  in effect (meaning this test can be used) for the duration of the COVID-19 declaration under Section 564(b)(1) of the Act, 21 U.S.C. section 360bbb-3(b)(1), unless the authorization is terminated or revoked sooner.    Influenza A by PCR NEGATIVE NEGATIVE Final   Influenza B by PCR NEGATIVE NEGATIVE Final    Comment: (NOTE) The Xpert Xpress SARS-CoV-2/FLU/RSV assay is intended as an aid in  the diagnosis of influenza from Nasopharyngeal swab specimens and  should not be used as a sole basis for treatment. Nasal washings and  aspirates are unacceptable for Xpert Xpress SARS-CoV-2/FLU/RSV  testing. Fact Sheet for Patients: PinkCheek.be Fact Sheet for Healthcare Providers: GravelBags.it This test is not yet approved or cleared by the [place] FDA and  has been authorized for detection and/or diagnosis of SARS-CoV-2 by  FDA under an Emergency Use Authorization (EUA). This EUA will remain  in effect (meaning this test can be used) for the duration of the  Covid-19 declaration under Section 564(b)(1) of the Act, 21  U.S.C. section 360bbb-3(b)(1), unless the authorization is  terminated or revoked. Performed at [facility], [place]  [address]  [place], [place] [address]   Blood culture (routine x 2)     Status: Abnormal   Collection Time: 12/10/19  8:47 PM   Specimen: BLOOD  Result Value Ref Range Status   Specimen Description BLOOD SITE NOT SPECIFIED  Final   Special Requests   Final    BOTTLES DRAWN AEROBIC AND ANAEROBIC Blood Culture results may not be optimal due to an inadequate volume of blood received in culture bottles   Culture  Setup Time   Final    GRAM POSITIVE RODS AEROBIC BOTTLE ONLY CRITICAL RESULT CALLED TO, READ BACK BY AND VERIFIED WITH: L. CURRAN,PHARMD 2127 12/11/2019 T. TYSOR    Culture (A)  Final    CORYNEBACTERIUM, GROUP JK Standardized susceptibility testing for this organism is not available. Performed at [facility], [place] [address], [place], [place] [address]    Report Status 12/12/2019 FINAL  Final  C Difficile Quick Screen w PCR reflex     Status: None   Collection Time: 12/11/19 10:12 AM   Specimen: STOOL  Result Value Ref Range Status   C Diff antigen NEGATIVE NEGATIVE Final   C Diff toxin NEGATIVE NEGATIVE Final   C Diff interpretation No C. difficile detected.  Final    Comment: Performed at [facility], [place] [address] [place], [place] [address]  Gastrointestinal Panel by PCR , Stool     Status: None   Collection Time: 12/11/19 10:12 AM   Specimen: Stool  Result Value Ref Range Status   Campylobacter species NOT DETECTED NOT DETECTED Final   Plesimonas shigelloides NOT DETECTED NOT DETECTED Final   Salmonella species NOT DETECTED NOT DETECTED Final   Yersinia enterocolitica NOT DETECTED NOT DETECTED Final   Vibrio species NOT DETECTED NOT DETECTED Final   Vibrio cholerae NOT DETECTED NOT DETECTED Final   Enteroaggregative E coli (EAEC) NOT DETECTED NOT DETECTED Final   Enteropathogenic E coli (EPEC) NOT DETECTED NOT DETECTED Final   Enterotoxigenic E coli (ETEC) NOT DETECTED NOT DETECTED Final   Shiga like toxin producing E coli (STEC) NOT DETECTED NOT DETECTED Final    Shigella/Enteroinvasive E coli (EIEC) NOT DETECTED NOT DETECTED Final   Cryptosporidium NOT DETECTED NOT DETECTED Final   Cyclospora cayetanensis NOT DETECTED NOT DETECTED Final   Entamoeba histolytica NOT DETECTED NOT DETECTED Final   Giardia lamblia NOT DETECTED NOT DETECTED Final   Adenovirus F40/41 NOT DETECTED NOT DETECTED Final   Astrovirus NOT DETECTED NOT DETECTED Final   Norovirus GI/GII NOT DETECTED NOT DETECTED Final   Rotavirus A NOT DETECTED NOT DETECTED Final   Sapovirus (I, II, IV, and V) NOT DETECTED NOT DETECTED Final    Comment: Performed at [facility], [place] [place], [place] [address]  Culture, blood (x 2)     Status: Abnormal   Collection Time: 12/12/19  7:39 AM   Specimen: BLOOD  Result Value Ref Range Status   Specimen Description BLOOD SITE NOT SPECIFIED  Final   Special Requests   Final    BOTTLES DRAWN AEROBIC ONLY Blood Culture results may not be optimal due to an inadequate volume of blood received in culture bottles   Culture  Setup Time   Final    AEROBIC BOTTLE ONLY GRAM POSITIVE RODS CRITICAL VALUE NOTED.  VALUE IS CONSISTENT WITH PREVIOUSLY REPORTED AND CALLED VALUE.    Culture (A)  Final    CORYNEBACTERIUM, GROUP JK Standardized susceptibility testing for this organism is not available. Performed  at [facility], [place] [address] [place], [place] [address]    Report Status 12/15/2019 FINAL  Final  Culture, blood (x 2)     Status: Abnormal   Collection Time: 12/12/19  7:53 AM   Specimen: BLOOD  Result Value Ref Range Status   Specimen Description BLOOD SITE NOT SPECIFIED  Final   Special Requests   Final    BOTTLES DRAWN AEROBIC ONLY Blood Culture results may not be optimal due to an inadequate volume of blood received in culture bottles   Culture  Setup Time   Final    AEROBIC BOTTLE ONLY GRAM POSITIVE RODS CRITICAL VALUE NOTED.  VALUE IS CONSISTENT WITH PREVIOUSLY REPORTED AND CALLED VALUE.    Culture (A)  Final     CORYNEBACTERIUM, GROUP JK Standardized susceptibility testing for this organism is not available. Performed at [facility], [place] [address] [place], [place] [address]    Report Status 12/15/2019 FINAL  Final     Studies: No results found.  Scheduled Meds: . sodium chloride   Intravenous Once  . Chlorhexidine Gluconate Cloth  6 each Topical Daily  . feeding supplement (ENSURE ENLIVE)  237 mL Oral TID BM  . fentaNYL      . lidocaine      . midazolam      . sodium chloride flush  10-40 mL Intracatheter Q12H  . sodium chloride flush  3 mL Intravenous Once  . sodium chloride flush  3 mL Intravenous Q12H    Continuous Infusions: . ceFAZolin    . lactated ringers 100 mL/hr (12/11/19 1831)  . vancomycin 1,000 mg (12/14/19 1235)     Time spent: 29mns, case discussed with hematology oncology Dr. DLorenso CourierI have personally reviewed and interpreted on  12/15/2019 daily labs, tele strips, imagings as discussed above under date review session and assessment and plans.  I reviewed all nursing notes, pharmacy notes, consultant notes,  vitals, pertinent old records  I have discussed plan of care as described above with RN , patient  on 12/15/2019   FFlorencia ReasonsMD, PhD, FACP  [facility]  Available via Epic secure chat 7am-7pm for nonurgent issues Please page for urgent issues, pager number available through [email or web address] .   12/15/2019, 1:17 PM  LOS: 5 days

## 2019-12-16 ENCOUNTER — Encounter (HOSPITAL_COMMUNITY): Payer: Self-pay | Admitting: Internal Medicine

## 2019-12-16 DIAGNOSIS — T80212A Local infection due to central venous catheter, initial encounter: Secondary | ICD-10-CM

## 2019-12-16 LAB — CBC WITH DIFFERENTIAL/PLATELET
Abs Immature Granulocytes: 0.38 10*3/uL — ABNORMAL HIGH (ref 0.00–0.07)
Basophils Absolute: 0 10*3/uL (ref 0.0–0.1)
Basophils Relative: 1 %
Eosinophils Absolute: 0 10*3/uL (ref 0.0–0.5)
Eosinophils Relative: 0 %
HCT: 27.3 % — ABNORMAL LOW (ref 36.0–46.0)
Hemoglobin: 8.6 g/dL — ABNORMAL LOW (ref 12.0–15.0)
Immature Granulocytes: 6 %
Lymphocytes Relative: 29 %
Lymphs Abs: 1.9 10*3/uL (ref 0.7–4.0)
MCH: 26.2 pg (ref 26.0–34.0)
MCHC: 31.5 g/dL (ref 30.0–36.0)
MCV: 83.2 fL (ref 80.0–100.0)
Monocytes Absolute: 1.9 10*3/uL — ABNORMAL HIGH (ref 0.1–1.0)
Monocytes Relative: 29 %
Neutro Abs: 2.2 10*3/uL (ref 1.7–7.7)
Neutrophils Relative %: 35 %
Platelets: 163 10*3/uL (ref 150–400)
RBC: 3.28 MIL/uL — ABNORMAL LOW (ref 3.87–5.11)
RDW: 19.9 % — ABNORMAL HIGH (ref 11.5–15.5)
WBC: 6.4 10*3/uL (ref 4.0–10.5)
nRBC: 0.9 % — ABNORMAL HIGH (ref 0.0–0.2)

## 2019-12-16 LAB — BASIC METABOLIC PANEL
Anion gap: 11 (ref 5–15)
BUN: 22 mg/dL — ABNORMAL HIGH (ref 6–20)
CO2: 20 mmol/L — ABNORMAL LOW (ref 22–32)
Calcium: 6.5 mg/dL — ABNORMAL LOW (ref 8.9–10.3)
Chloride: 107 mmol/L (ref 98–111)
Creatinine, Ser: 1.23 mg/dL — ABNORMAL HIGH (ref 0.44–1.00)
GFR calc Af Amer: 58 mL/min — ABNORMAL LOW (ref >60)
GFR calc non Af Amer: 50 mL/min — ABNORMAL LOW (ref >60)
Glucose, Bld: 90 mg/dL (ref 70–99)
Potassium: 4.1 mmol/L (ref 3.5–5.1)
Sodium: 138 mmol/L (ref 135–145)

## 2019-12-16 LAB — VANCOMYCIN, TROUGH: Vancomycin Tr: 26 ug/mL (ref 15–20)

## 2019-12-16 LAB — GLUCOSE, CAPILLARY
Glucose-Capillary: 84 mg/dL (ref 70–99)
Glucose-Capillary: 87 mg/dL (ref 70–99)

## 2019-12-16 LAB — MAGNESIUM: Magnesium: 1.9 mg/dL (ref 1.7–2.4)

## 2019-12-16 MED ORDER — PRO-STAT SUGAR FREE PO LIQD
30.0000 mL | Freq: Two times a day (BID) | ORAL | Status: DC
  Administered 2019-12-16 – 2019-12-18 (×2): 30 mL via ORAL
  Filled 2019-12-16 (×3): qty 30

## 2019-12-16 MED ORDER — VANCOMYCIN HCL 750 MG/150ML IV SOLN
750.0000 mg | INTRAVENOUS | Status: DC
  Administered 2019-12-17 – 2019-12-18 (×2): 750 mg via INTRAVENOUS
  Filled 2019-12-16 (×2): qty 150

## 2019-12-16 MED ORDER — CALCIUM GLUCONATE-NACL 1-0.675 GM/50ML-% IV SOLN
1.0000 g | Freq: Once | INTRAVENOUS | Status: AC
  Administered 2019-12-16: 1000 mg via INTRAVENOUS
  Filled 2019-12-16: qty 50

## 2019-12-16 MED ORDER — BOOST / RESOURCE BREEZE PO LIQD CUSTOM
1.0000 | Freq: Three times a day (TID) | ORAL | Status: DC
  Administered 2019-12-18: 09:00:00 1 via ORAL

## 2019-12-16 NOTE — Evaluation (Signed)
Clinical/Bedside Swallow Evaluation Patient Details  Name: Melissa Hurley MRN: [id number] Date of Birth: Dec 13, 1965  Today's Date: 12/16/2019 Time: SLP Start Time (ACUTE ONLY): 1008 SLP Stop Time (ACUTE ONLY): 1020 SLP Time Calculation (min) (ACUTE ONLY): 12 min  Past Medical History:  Past Medical History:  Diagnosis Date  . Cancer ([place])    Liver  . Cholangiocarcinoma ([place])   . Metastasis ([place])   . Pericardial effusion   . Sinus tachycardia   . Uterine fibroid    Past Surgical History:  Past Surgical History:  Procedure Laterality Date  . CESAREAN SECTION    . IR REMOVAL TUN ACCESS W/ PORT W/O FL MOD SED  12/15/2019   HPI:  Pt is a 54 year old female admitted with bacteremia/febrile neutropenia/sepsis on presentation including hypokalemia, hypomagnesemia, hypophosphatemia as well as pancytopenia with undetectable platelets. Report poor oral intake at home due to loss of appetite denies of dysphagia and has been observed choking on pills, Past medical history of, metastatic intrahepatic cholangiocarcinoma, followed at [facility] and currently undergoing tx cisplatin/gemcitabine, who was recently hospitalized at [facility] in 10/2019 due to acute left MCA CVA seen by SLP at that time,  presented with moderate dysarthria and suspected mild apraxia of speech mild right facial droop, lingual deviation to the right upon protrusion. MRI on 3/8 showed is cortical/subcortical reduced diffusion in the right MCA territory primarily involving the temporal lobe and posterior insula with some additional parietal greater than frontal involvement. Contralateral foci of reduced diffusion are also present in the left frontal lobe. A few small foci are present within the right cerebellar hemisphere.   Assessment / Plan / Recommendation Clinical Impression     [name] was seen for a clinical swallow evaluation in setting of hospitalization for pancytopenia with noted poor PO intake. Pt denies  any swallowing difficulty, but reports dysgeusia from cancer treatment resulting in reduced appetite. Pt was seen upright in bed with breakfast tray in front of her. Oral mech exam was unremarkable (improved from prior oral mech exam in 03/21 which showed some R sided facial weakness from CVA). She was seen with thin liquids via cup and straw as well as regular consistency sausage. Oral phase was mildly slow in nature, but functional. She consumed thin liquids in sequential sips via straw of roughly 3 oz without any s/s aspiration, making her at low risk for aspiration/aspiration PNA.   Based on current findings and pt report of no difficulty swallowing, recommend continue regular consistency solids and thin liquids with no ST f/u indicated at this time.  SLP Visit Diagnosis: Dysphagia, unspecified (R13.10)    Aspiration Risk  Mild aspiration risk    Diet Recommendation Regular   Liquid Administration via: Cup;Straw Medication Administration: Whole meds with liquid Supervision: Patient able to self feed Postural Changes: Seated upright at 90 degrees    Other  Recommendations Oral Care Recommendations: Oral care BID   Follow up Recommendations None          Prognosis   Good     Swallow Study   General HPI: Pt is a 54 year old female admitted with bacteremia/febrile neutropenia/sepsis on presentation including hypokalemia, hypomagnesemia, hypophosphatemia as well as pancytopenia with undetectable platelets. Report poor oral intake at home due to loss of appetite denies of dysphagia and has been observed choking on pills, Past medical history of, metastatic intrahepatic cholangiocarcinoma, followed at [facility] and currently undergoing tx cisplatin/gemcitabine, who was recently hospitalized at [facility]  in 10/2019 due to acute left MCA CVA seen by SLP at that time,  presented with moderate dysarthria and suspected mild apraxia of speech mild right facial droop, lingual deviation to  the right upon protrusion. MRI on 3/8 showed is cortical/subcortical reduced diffusion in the right MCA territory primarily involving the temporal lobe and posterior insula with some additional parietal greater than frontal involvement. Contralateral foci of reduced diffusion are also present in the left frontal lobe. A few small foci are present within the right cerebellar hemisphere. Type of Study: Bedside Swallow Evaluation Previous Swallow Assessment: no prior swallow assessment Diet Prior to this Study: Regular Temperature Spikes Noted: No Respiratory Status: Room air Behavior/Cognition: Alert;Cooperative;Pleasant mood Oral Cavity Assessment: Within Functional Limits Oral Care Completed by SLP: Recent completion by staff Oral Cavity - Dentition: Adequate natural dentition Self-Feeding Abilities: Able to feed self Patient Positioning: Upright in bed Baseline Vocal Quality: Normal    Oral/Motor/Sensory Function Overall Oral Motor/Sensory Function: Within functional limits Facial ROM: Within Functional Limits Facial Symmetry: Within Functional Limits Facial Strength: Within Functional Limits Lingual Symmetry: Within Functional Limits   Ice Chips     Thin Liquid Thin Liquid: Within functional limits Presentation: Straw;Cup    Solid      Monterius Rolf P. Mamadou Breon, M.S., CCC-SLP Speech-Language Pathologist Acute Rehabilitation Services Pager: [phone]  Solid: Within functional limits Presentation: [place] 12/16/2019,10:27 AM

## 2019-12-16 NOTE — Consult Note (Signed)
[place]  [phone] Fax:([phone] [place]  Referral MD:  Dr. Florencia Reasons  Reason for Referral: Metastatic cholangiocarcinoma  ONCOLOGY HISTORY:  07/23/2019: Initially presented with a 96-monthhistory of fatigue, exertional dyspnea and abdominal bloating [facility]   07/30/2019: CT abdomen/pelvis: Multiple hypervascular liver masses, largest measuring 12.5 cm. These are suspicious for hepatic metastasis, with multifocal hepatocellular carcinoma considered less likely. Mild abdominal lymphadenopathy, consistent with metastatic disease. Subcentimeter pulmonary nodules in both lung bases, highly suspicious for pulmonary metastasis. Mild eccentric wall thickening at the GE junction, which could represent a small esophageal carcinoma. Large uterine fibroids.  08/02/2019: CT chest: Moderate sized pericardial effusion with interval increase in size. Previously demonstrated small hiatal hernia with eccentric wall thickening. The wall thickening is on the right and has lobulated contours, suggesting the possibility of primary malignancy. Small number of bilateral subcentimeter lung nodules and a small calcified granuloma in the right lower lobe. Noncalcified nodules are suspicious for metastasis. Noncalcified granuloma are also possibility. Previously demonstrated large right lobe liver mass and poorly defined additional masses, suspicious for metastasis. Aortic atherosclerosis.  08/03/2019: ECHO: LVEF is 55-60%. Moderately sized pericardial effusion is present. The pericardial effusion is circumferential with no evidence of cardiac tamponade. Presence of pericardial fat pad.   08/04/2019: EGD revealed Candida infection of the esophagus, small hiatal hernia, with no other concerning findings. Colonoscopy performed with no malignancy or abnormalities noted.  08/06/2019: CEA is <1.00. AFP is 18.0 (H),  CA19-9 is <2. HSAg, HS ab, Hep B Core total antibdy: All non-reactive. HCV Ab Negative  08/17/2019: [place]: Adenocarcinoma. IHC is positive for CK7, PAX-8 (weak). Negative for CK20, CDX-2, TTF-1, ER and GATA-3. Immunoprofil is somewhat nonspecific and the differential includes pancreatobiliary, upper gastrointestinal, breast, gynecologic. In the absence of the primary clinic this could be compatible with pancreatobiliary.  08/26/2019: Underwent endometrial biopsy with OB/GYN. Pathology proliferative endometrium, benign. Endocervical glandular epithelium, benign.  08/27/2019: [facility] that gem[facility]abine/cisplatin therapy.   09/06/2019: CT-guided biopsy of pelvic mass: Pathology is consistent with leiomyomma.   08/06/2019 HSAg, HS ab, Hep B Core total antibdy: All non-reactive. HCV Ab Negative  Tumor Markers: ? 08/27/19:CA 1734 19? 08/06/19 AFP 18  09/29/19: MR Liver: Interval increase in the size and number of multiple peripherally enhancing necrotic hepatic space-occupying lesions, compared to outside facility CT 07/30/2019 suggestive of progression of disease.Large retroperitoneal lymph nodes concerning for nodal metastasis.Small anterior left sixth rib lesion, concerning for osseous metastasis. Indeterminate L1 vertebral body lesion. Partially imaged small left pulmonary nodules, better seen on outside CT chest 08/02/2019.   10/11/19 MRI L spine: Numerous T1 hypointense, STIR hyperintense, enhancing lesions concerning for osseous metastatic disease throughout the visualized lumbar spine and bony pelvis, including the L2, L3 and the L4 vertebral bodies. Upper Sacrum: Multifocal osseous metastatic lesions throughout the bony pelvis, including the right superior endplate of S1, the left S1 ala, and bilateral iliac wings. The largest lesion measures approximately 2.3 x 2.1 cm and is centered within the left iliac wing, with suspected  extraosseous soft tissue extension   10/15/2019 XRT evaluation by Dr COneita Kras The spinal mets would be amenable to palliative radiation. at the time of consultation she was not having any pain. Dr COneita Krasrecommended she continue systemic therapy.  [facility] The pt presented with a hx of  slurred speech for 3 days. MRI brain showed acute left MCA CVA. Transition from aspirin to Eliquis 5 mg twice daily per neurology.Echocardiogram-EF 60 to 65%. Treated for acute cystitis without hematuria initially on Rocephin, transition to Keflex.   Guardant 360: NRAS Q61R, 4.78 Muts/Mb, MSI-H not detected.   Foundation One: PD-L1 negative, MS stable, 3 Muts/Mb, CDKN2A loss, CDKN2B loss, MTAP loss, NRAS Q61R   ONCOLOGY TREATMENT HISTORY: -10/04/19: C1D1 Gemcitabine/Cisplatin  -10/11/19: C1D8 Gemcitabine/Cisplatin -10/27/19: C2D1 Gemcitabine/Cisplatin (the pt did not make her appt due to hospitalization at [facility] secondary to CVA) -11/03/19: C2D1 Gemcitabine/Cisplatin -11/10/19: C2D8 Gemcitabine/Cisplatin -11/24/19: C3D1 Gemcitabine/Cisplatin   -12/01/19: C3D8 gemcitabine/cisplatin   HPI: Ms. Mauri is a 54 year old female with a past medical history significant for metastatic cholangiocarcinoma currently being treated at [facility] health in recent acute left MCA CVA in March 2021.  She has been receiving chemotherapy with gemcitabine and cisplatin.  Her chemotherapy is given on day 1 and day 8 of 21-day cycle.  Day 1 of cycle 3 was started on 11/24/2019.  She presented to the emergency room with nausea, vomiting, and anorexia.  In the days prior to admission, she had had some epistaxis and bruising.  She had contacted her oncologist who recommended for her to be evaluated in the emergency room due to concern for dehydration.  The patient also had been on Eliquis due to her prior CVA and last dose was given on the morning of admission.  In the ER, labs showed  a WBC of 1.4, hemoglobin 6.5, platelet count of less than 5000.  She was also noted to have a potassium level of 3.1, calcium level of 6.0 and creatinine of 1.49.  This admission, she has received 2 units of PRBCs and 1 unit of platelets.  Labs during this admission have been trending upward.  Her CBC from today revealed WBC of 6.4, hemoglobin 8.6, and platelet count 163,000.  The patient was found to have bacteremia and has been having recurrent fevers and sepsis.  Port-A-Cath was removed on 12/15/2019.  She is also been seen by ID.  Fever curve improving.  This admission, she had an echocardiogram performed on 12/13/2019 which showed a moderate to large pericardial effusion without evidence of cardiac tamponade.  She has been seen by cardiology who does not feel that she requires pericardiocentesis at this time.  When seen today, the patient's husband is at the bedside.  The patient defers to her husband to answer questions.  She is extremely tearful today.  She reports a poor appetite.  She has not had any fevers or chills for the past 1 to 2 days.  Denies headaches and dizziness.  Denies chest pain, shortness of breath, cough.  Denies abdominal pain but has nausea without vomiting.  She also reports diarrhea.  She had some vaginal bleeding prior to admission when her platelet count was low when she was on Eliquis.  This has resolved.  She reports lower extremity edema which has been present for several months.  The patient reports that she has been tolerating her chemotherapy moderately well with the exception of nausea which is controlled with oral antiemetics and anorexia.  The patient has expressed that she is having difficulty getting to [facility] for her cancer care and would like to transition her care back to [facility] to Dr. Lorenso Courier.   Past Medical History:  Diagnosis Date  . Cancer ([place])    Liver  . Cholangiocarcinoma ([place])   . Metastasis (  [facility])   . Pericardial effusion   . Sinus  tachycardia   . Uterine fibroid   :  Past Surgical History:  Procedure Laterality Date  . CESAREAN SECTION    . IR REMOVAL TUN ACCESS W/ PORT W/O FL MOD SED  12/15/2019  :  Current Facility-Administered Medications  Medication Dose Route Frequency Provider Last Rate Last Admin  . 0.9 %  sodium chloride infusion (Manually program via Guardrails IV Fluids)   Intravenous Once Dessa Phi, DO      . acetaminophen (TYLENOL) tablet 650 mg  650 mg Oral Q6H PRN Dessa Phi, DO   650 mg at 12/11/19 1956   Or  . acetaminophen (TYLENOL) suppository 650 mg  650 mg Rectal Q6H PRN Dessa Phi, DO   650 mg at 12/11/19 0104  . Chlorhexidine Gluconate Cloth 2 % PADS 6 each  6 each Topical Daily Dessa Phi, DO   6 each at 12/15/19 1034  . feeding supplement (ENSURE ENLIVE) (ENSURE ENLIVE) liquid 237 mL  237 mL Oral TID BM Lavina Hamman, MD      . lactated ringers infusion   Intravenous Continuous Lavina Hamman, MD 100 mL/hr at 12/15/19 1500 New Bag at 12/15/19 1500  . LORazepam (ATIVAN) tablet 1 mg  1 mg Oral Q8H PRN Dessa Phi, DO      . metoprolol tartrate (LOPRESSOR) injection 2.5 mg  2.5 mg Intravenous Q4H PRN Florencia Reasons, MD      . metoprolol tartrate (LOPRESSOR) tablet 12.5 mg  12.5 mg Oral BID Florencia Reasons, MD   12.5 mg at 12/16/19 0855  . ondansetron (ZOFRAN) injection 4 mg  4 mg Intravenous Q6H PRN Dessa Phi, DO   4 mg at 12/10/19 1853  . sodium chloride flush (NS) 0.9 % injection 10-40 mL  10-40 mL Intracatheter Q12H Dessa Phi, DO   10 mL at 12/16/19 0856  . sodium chloride flush (NS) 0.9 % injection 10-40 mL  10-40 mL Intracatheter PRN Dessa Phi, DO      . sodium chloride flush (NS) 0.9 % injection 3 mL  3 mL Intravenous Once Dessa Phi, DO      . sodium chloride flush (NS) 0.9 % injection 3 mL  3 mL Intravenous Q12H Dessa Phi, DO   3 mL at 12/15/19 2153  . vancomycin (VANCOCIN) IVPB 1000 mg/200 mL premix  1,000 mg Intravenous Q24H Karren Cobble, RPH  200 mL/hr at 12/15/19 1501 1,000 mg at 12/15/19 1501     No Known Allergies:  Family History  Problem Relation Age of Onset  . Breast cancer Mother        77's  . Ovarian cancer Mother   . Lung cancer Mother        32s  . Ovarian cancer Maternal Aunt   . Breast cancer Maternal Grandmother   . Breast cancer Cousin        maternal, dx50+  . Colon cancer Neg Hx   . Esophageal cancer Neg Hx   . Rectal cancer Neg Hx   . Stomach cancer Neg Hx   :  Social History   Socioeconomic History  . Marital status: Married    Spouse name: Not on file  . Number of children: Not on file  . Years of education: Not on file  . Highest education level: Not on file  Occupational History  . Occupation: Pharmacist, hospital  Tobacco Use  . Smoking status: Never Smoker  . Smokeless tobacco: Never Used  Substance and  Sexual Activity  . Alcohol use: Never  . Drug use: Never  . Sexual activity: Not Currently  Other Topics Concern  . Not on file  Social History Narrative  . Not on file   Social Determinants of Health   Financial Resource Strain:   . Difficulty of Paying Living Expenses:   Food Insecurity:   . Worried About Charity fundraiser in the Last Year:   . Arboriculturist in the Last Year:   Transportation Needs:   . Film/video editor (Medical):   Marland Kitchen Lack of Transportation (Non-Medical):   Physical Activity:   . Days of Exercise per Week:   . Minutes of Exercise per Session:   Stress:   . Feeling of Stress :   Social Connections:   . Frequency of Communication with Friends and Family:   . Frequency of Social Gatherings with Friends and Family:   . Attends Religious Services:   . Active Member of Clubs or Organizations:   . Attends Archivist Meetings:   Marland Kitchen Marital Status:   Intimate Partner Violence:   . Fear of Current or Ex-Partner:   . Emotionally Abused:   Marland Kitchen Physically Abused:   . Sexually Abused:   :  Review of Systems: A comprehensive 14 point review of  systems was negative except as noted in the HPI.  Exam: Patient Vitals for the past 24 hrs:  BP Temp Temp src Pulse Resp SpO2  12/16/19 0737 124/66 98.7 F (37.1 C) Oral 98 16 100 %  12/16/19 0300 (!) 118/52 97.8 F (36.6 C) Oral 93 (!) 23 100 %  12/16/19 0030 (!) 125/52 98.4 F (36.9 C) Oral 90 20 100 %  12/15/19 2313 -- -- -- 84 20 100 %  12/15/19 2105 129/86 99.4 F (37.4 C) Oral 86 (!) 21 100 %  12/15/19 1710 127/88 97.9 F (36.6 C) Oral (!) 112 20 100 %  12/15/19 1350 117/89 -- -- (!) 108 15 100 %  12/15/19 1345 (!) 121/95 -- -- (!) 103 16 100 %  12/15/19 1340 (!) 113/94 -- -- (!) 106 (!) 21 97 %  12/15/19 1335 (!) 137/105 -- -- (!) 103 (!) 23 100 %  12/15/19 1322 122/67 -- -- (!) 103 (!) 22 100 %    General: Awake and alert, very tearful Eyes:  no scleral icterus.   ENT:  There were no oropharyngeal lesions.     Lymphatics:  Negative cervical, supraclavicular or axillary adenopathy. Respiratory: lungs were clear bilaterally without wheezing or crackles. Cardiovascular: Tachycardic.  Trace bilateral pedal edema.   GI:  abdomen was soft, flat, nontender, nondistended, without organomegaly.   Musculoskeletal:  no spinal tenderness of palpation of vertebral spine.   Skin: Ecchymoses noted on her upper and lower extremities, Port-A-Cath site dressing without drainage Neuro exam was nonfocal. Patient was alert and oriented.     Lab Results  Component Value Date   WBC 6.4 12/16/2019   HGB 8.6 (L) 12/16/2019   HCT 27.3 (L) 12/16/2019   PLT 163 12/16/2019   GLUCOSE 90 12/16/2019   CHOL 108 10/26/2019   TRIG 194 (H) 10/26/2019   HDL 14 (L) 10/26/2019   LDLCALC 55 10/26/2019   ALT 15 12/11/2019   AST 26 12/11/2019   NA 138 12/16/2019   K 4.1 12/16/2019   CL 107 12/16/2019   CREATININE 1.23 (H) 12/16/2019   BUN 22 (H) 12/16/2019   CO2 20 (L) 12/16/2019    IR REMOVAL  TUN ACCESS W/ PORT W/O FL MOD SED  Result Date: 12/15/2019 INDICATION: 54 year old female with a  history of right port catheter infection referred for removal EXAM: REMOVAL RIGHT IJ VEIN PORT-A-CATH MEDICATIONS: 2 g Ancef; The antibiotic was administered within an appropriate time interval prior to skin puncture. ANESTHESIA/SEDATION: Moderate (conscious) sedation was employed during this procedure. A total of Versed 2.0 mg and Fentanyl 100 mcg was administered intravenously. Moderate Sedation Time: 25 minutes. The patient's level of consciousness and vital signs were monitored continuously by radiology nursing throughout the procedure under my direct supervision. FLUOROSCOPY TIME:  None COMPLICATIONS: None PROCEDURE: Informed consent was obtained from the patient following an explanation of the procedure, risks, benefits and alternatives. The patient understands, agrees and consents for the procedure. All questions were addressed. A time out was performed prior to the initiation of the procedure. The patient was positioned in the operation suite in the supine position on a gantry. Visual inspection demonstrates no redness at the site with no ballotable fluid collection. No purulence. The previous scar on the right chest was generously infiltrated with 1% lidocaine for local anesthesia. Infiltration of the skin and subcutaneous tissues surrounding the port was performed. Using sharp and blunt dissection, the double-lumen port apparatus and subcutaneous catheter were removed in their entirety. No purulence within the pocket which was in good repair. Generous irrigation was performed. The port pocket was then closed with interrupted Vicryl layer and a running subcuticular with 4-0 Monocryl. The skin was sealed with Derma bond. A sterile dressing was placed. Tip culture was sent. The patient tolerated the procedure well and remained hemodynamically stable throughout. No complications were encountered and no significant blood loss was encountered. IMPRESSION: Status post right IJ port catheter removal. Signed, Dulcy Fanny. Dellia Nims, RPVI [facility] Electronically Signed   By: Corrie Mckusick D.O.   On: 12/15/2019 16:36   DG Chest Port 1 View  Result Date: 12/10/2019 CLINICAL DATA:  54 year old female with weakness. EXAM: PORTABLE CHEST 1 VIEW COMPARISON:  Chest radiograph dated 10/25/2019. FINDINGS: Right-sided Port-A-Cath with tip at the cavoatrial junction. There is no focal consolidation, pleural effusion, pneumothorax. Mild cardiomegaly. Atherosclerotic calcification of the aortic arch. No acute osseous pathology. IMPRESSION: 1. No acute cardiopulmonary process. 2. Mild cardiomegaly. Electronically Signed   By: Anner Crete M.D.   On: 12/10/2019 16:21   DG Abd Portable 1V  Result Date: 12/11/2019 CLINICAL DATA:  Nausea weakness in emesis EXAM: PORTABLE ABDOMEN - 1 VIEW COMPARISON:  CT study 07/30/2019 FINDINGS: Centralized mildly dilated loops of small bowel. No visible colonic or rectal gas. LEFT flank excluded from view. Calcified fibroid in the LEFT lower quadrant. No acute bone process. IMPRESSION: Centralized mildly dilated loops of small bowel. No visible colonic or rectal gas. Findings may reflect ileus versus early or small bowel obstruction. Electronically Signed   By: Zetta Bills M.D.   On: 12/11/2019 14:46   ECHOCARDIOGRAM COMPLETE  Result Date: 12/13/2019    ECHOCARDIOGRAM REPORT   Patient Name:   DORETHIA JEANMARIE Date of Exam: 12/13/2019 Medical Rec #:  [id number]       Height:       66.0 in Accession #:    [id number]      Weight:       198.9 lb Date of Birth:  Apr 22, 1966      BSA:          1.995 m Patient Age:    37 years  BP:           112/68 mmHg Patient Gender: F               HR:           100 bpm. Exam Location:  Inpatient Procedure: 2D Echo and Intracardiac Opacification Agent Indications:    Corynebacteria Bacteremia 790.7 / R78.81  History:        Patient has prior history of Echocardiogram examinations, most                  recent 10/26/2019. Stroke. Metastatic intrahepatic                 cholangiocarcinoma, Pericardial effusion, Sinus Tachycardia.  Sonographer:    Darlina Sicilian RDCS Referring Phys: [id number] CORNELIUS N VAN DAM  Sonographer Comments: Suboptimal apical window. IMPRESSIONS  1. Left ventricular ejection fraction, by estimation, is 55 to 60%. The left ventricle has normal function. The left ventricle has no regional wall motion abnormalities. There is mild left ventricular hypertrophy. Indeterminate diastolic filling due to E-A fusion.  2. Right ventricular systolic function is normal. The right ventricular size is normal. Tricuspid regurgitation signal is inadequate for assessing PA pressure.  3. Left atrial size was mildly dilated.  4. Moderate to large pericardial effusion. The pericardial effusion is anterior to the right ventricle and surrounding the apex. There is no evidence of cardiac tamponade. No RV diastolic collapse, IVC appears small and collapsible. Respirometer evaluation not performed.  5. The mitral valve is normal in structure. No evidence of mitral valve regurgitation. No evidence of mitral stenosis.  6. The aortic valve is tricuspid. Aortic valve regurgitation is mild. No aortic stenosis is present.  7. The inferior vena cava is normal in size with greater than 50% respiratory variability, suggesting right atrial pressure of 3 mmHg. Conclusion(s)/Recommendation(s): No evidence of valvular vegetations on this transthoracic echocardiogram. Would recommend a transesophageal echocardiogram to exclude infective endocarditis if clinically indicated. FINDINGS  Left Ventricle: Left ventricular ejection fraction, by estimation, is 55 to 60%. The left ventricle has normal function. The left ventricle has no regional wall motion abnormalities. Definity contrast agent was given IV to delineate the left ventricular  endocardial borders. The left ventricular internal cavity size was normal in size. There is mild left  ventricular hypertrophy. Indeterminate diastolic filling due to E-A fusion. Right Ventricle: The right ventricular size is normal. No increase in right ventricular wall thickness. Right ventricular systolic function is normal. Tricuspid regurgitation signal is inadequate for assessing PA pressure. Left Atrium: Left atrial size was mildly dilated. Right Atrium: Right atrial size was normal in size. Pericardium: Moderate to large pericardial effusion. The pericardial effusion is anterior to the right ventricle and surrounding the apex. There is no evidence of cardiac tamponade. Mitral Valve: The mitral valve is normal in structure. Normal mobility of the mitral valve leaflets. No evidence of mitral valve regurgitation. No evidence of mitral valve stenosis. Tricuspid Valve: The tricuspid valve is normal in structure. Tricuspid valve regurgitation is not demonstrated. No evidence of tricuspid stenosis. Aortic Valve: The aortic valve is tricuspid. Aortic valve regurgitation is mild. No aortic stenosis is present. Pulmonic Valve: The pulmonic valve was normal in structure. Pulmonic valve regurgitation is not visualized. No evidence of pulmonic stenosis. Aorta: The aortic root is normal in size and structure. Venous: The inferior vena cava is normal in size with greater than 50% respiratory variability, suggesting right atrial pressure of 3 mmHg. IAS/Shunts: No atrial level shunt  detected by color flow Doppler.  LEFT VENTRICLE PLAX 2D LVIDd:         3.40 cm     Diastology LVIDs:         2.40 cm     LV e' lateral:   4.46 cm/s LV PW:         1.00 cm     LV E/e' lateral: 12.5 LV IVS:        1.10 cm     LV e' medial:    5.22 cm/s LVOT diam:     2.00 cm     LV E/e' medial:  10.7 LV SV:         34 LV SV Index:   17 LVOT Area:     3.14 cm  LV Volumes (MOD) LV vol d, MOD A2C: 78.6 ml LV vol d, MOD A4C: 93.2 ml LV vol s, MOD A2C: 38.6 ml LV vol s, MOD A4C: 50.6 ml LV SV MOD A2C:     40.0 ml LV SV MOD A4C:     93.2 ml LV SV MOD BP:       40.3 ml RIGHT VENTRICLE RV S prime:     27.60 cm/s LEFT ATRIUM           Index       RIGHT ATRIUM          Index LA diam:      2.60 cm 1.30 cm/m  RA Area:     9.78 cm LA Vol (A2C): 28.5 ml 14.29 ml/m RA Volume:   17.25 ml 8.65 ml/m LA Vol (A4C): 74.1 ml 37.14 ml/m  AORTIC VALVE LVOT Vmax:   75.00 cm/s LVOT Vmean:  54.700 cm/s LVOT VTI:    0.108 m  AORTA Ao Root diam: 3.00 cm MITRAL VALVE MV Area (PHT): 6.43 cm    SHUNTS MV Decel Time: 118 msec    Systemic VTI:  0.11 m MV E velocity: 55.60 cm/s  Systemic Diam: 2.00 cm MV A velocity: 98.20 cm/s MV E/A ratio:  0.57 Cherlynn Kaiser MD Electronically signed by Cherlynn Kaiser MD Signature Date/Time: 12/13/2019/7:29:39 PM    Final      IR REMOVAL TUN ACCESS W/ PORT W/O FL MOD SED  Result Date: 12/15/2019 INDICATION: 54 year old female with a history of right port catheter infection referred for removal EXAM: REMOVAL RIGHT IJ VEIN PORT-A-CATH MEDICATIONS: 2 g Ancef; The antibiotic was administered within an appropriate time interval prior to skin puncture. ANESTHESIA/SEDATION: Moderate (conscious) sedation was employed during this procedure. A total of Versed 2.0 mg and Fentanyl 100 mcg was administered intravenously. Moderate Sedation Time: 25 minutes. The patient's level of consciousness and vital signs were monitored continuously by radiology nursing throughout the procedure under my direct supervision. FLUOROSCOPY TIME:  None COMPLICATIONS: None PROCEDURE: Informed consent was obtained from the patient following an explanation of the procedure, risks, benefits and alternatives. The patient understands, agrees and consents for the procedure. All questions were addressed. A time out was performed prior to the initiation of the procedure. The patient was positioned in the operation suite in the supine position on a gantry. Visual inspection demonstrates no redness at the site with no ballotable fluid collection. No purulence. The previous scar on the right  chest was generously infiltrated with 1% lidocaine for local anesthesia. Infiltration of the skin and subcutaneous tissues surrounding the port was performed. Using sharp and blunt dissection, the double-lumen port apparatus and subcutaneous catheter were removed in their entirety. No  purulence within the pocket which was in good repair. Generous irrigation was performed. The port pocket was then closed with interrupted Vicryl layer and a running subcuticular with 4-0 Monocryl. The skin was sealed with Derma bond. A sterile dressing was placed. Tip culture was sent. The patient tolerated the procedure well and remained hemodynamically stable throughout. No complications were encountered and no significant blood loss was encountered. IMPRESSION: Status post right IJ port catheter removal. Signed, Dulcy Fanny. Dellia Nims, RPVI [facility] Radiology Electronically Signed   By: Corrie Mckusick D.O.   On: 12/15/2019 16:36   DG Chest Port 1 View  Result Date: 12/10/2019 CLINICAL DATA:  54 year old female with weakness. EXAM: PORTABLE CHEST 1 VIEW COMPARISON:  Chest radiograph dated 10/25/2019. FINDINGS: Right-sided Port-A-Cath with tip at the cavoatrial junction. There is no focal consolidation, pleural effusion, pneumothorax. Mild cardiomegaly. Atherosclerotic calcification of the aortic arch. No acute osseous pathology. IMPRESSION: 1. No acute cardiopulmonary process. 2. Mild cardiomegaly. Electronically Signed   By: Anner Crete M.D.   On: 12/10/2019 16:21   DG Abd Portable 1V  Result Date: 12/11/2019 CLINICAL DATA:  Nausea weakness in emesis EXAM: PORTABLE ABDOMEN - 1 VIEW COMPARISON:  CT study 07/30/2019 FINDINGS: Centralized mildly dilated loops of small bowel. No visible colonic or rectal gas. LEFT flank excluded from view. Calcified fibroid in the LEFT lower quadrant. No acute bone process. IMPRESSION: Centralized mildly dilated loops of small bowel. No  visible colonic or rectal gas. Findings may reflect ileus versus early or small bowel obstruction. Electronically Signed   By: Zetta Bills M.D.   On: 12/11/2019 14:46   ECHOCARDIOGRAM COMPLETE  Result Date: 12/13/2019    ECHOCARDIOGRAM REPORT   Patient Name:   VELEKA DJORDJEVIC Date of Exam: 12/13/2019 Medical Rec #:  [id number]       Height:       66.0 in Accession #:    [id number]      Weight:       198.9 lb Date of Birth:  February 24, 1966      BSA:          1.995 m Patient Age:    50 years        BP:           112/68 mmHg Patient Gender: F               HR:           100 bpm. Exam Location:  Inpatient Procedure: 2D Echo and Intracardiac Opacification Agent Indications:    Corynebacteria Bacteremia 790.7 / R78.81  History:        Patient has prior history of Echocardiogram examinations, most                 recent 10/26/2019. Stroke. Metastatic intrahepatic                 cholangiocarcinoma, Pericardial effusion, Sinus Tachycardia.  Sonographer:    Darlina Sicilian RDCS Referring Phys: [id number] CORNELIUS N VAN DAM  Sonographer Comments: Suboptimal apical window. IMPRESSIONS  1. Left ventricular ejection fraction, by estimation, is 55 to 60%. The left ventricle has normal function. The left ventricle has no regional wall motion abnormalities. There is mild left ventricular hypertrophy. Indeterminate diastolic filling due to E-A fusion.  2. Right ventricular systolic function is normal. The right ventricular size is normal. Tricuspid regurgitation signal is inadequate for assessing PA pressure.  3. Left atrial size was mildly dilated.  4.  Moderate to large pericardial effusion. The pericardial effusion is anterior to the right ventricle and surrounding the apex. There is no evidence of cardiac tamponade. No RV diastolic collapse, IVC appears small and collapsible. Respirometer evaluation not performed.  5. The mitral valve is normal in structure. No evidence of mitral valve regurgitation. No evidence of mitral stenosis.  6.  The aortic valve is tricuspid. Aortic valve regurgitation is mild. No aortic stenosis is present.  7. The inferior vena cava is normal in size with greater than 50% respiratory variability, suggesting right atrial pressure of 3 mmHg. Conclusion(s)/Recommendation(s): No evidence of valvular vegetations on this transthoracic echocardiogram. Would recommend a transesophageal echocardiogram to exclude infective endocarditis if clinically indicated. FINDINGS  Left Ventricle: Left ventricular ejection fraction, by estimation, is 55 to 60%. The left ventricle has normal function. The left ventricle has no regional wall motion abnormalities. Definity contrast agent was given IV to delineate the left ventricular  endocardial borders. The left ventricular internal cavity size was normal in size. There is mild left ventricular hypertrophy. Indeterminate diastolic filling due to E-A fusion. Right Ventricle: The right ventricular size is normal. No increase in right ventricular wall thickness. Right ventricular systolic function is normal. Tricuspid regurgitation signal is inadequate for assessing PA pressure. Left Atrium: Left atrial size was mildly dilated. Right Atrium: Right atrial size was normal in size. Pericardium: Moderate to large pericardial effusion. The pericardial effusion is anterior to the right ventricle and surrounding the apex. There is no evidence of cardiac tamponade. Mitral Valve: The mitral valve is normal in structure. Normal mobility of the mitral valve leaflets. No evidence of mitral valve regurgitation. No evidence of mitral valve stenosis. Tricuspid Valve: The tricuspid valve is normal in structure. Tricuspid valve regurgitation is not demonstrated. No evidence of tricuspid stenosis. Aortic Valve: The aortic valve is tricuspid. Aortic valve regurgitation is mild. No aortic stenosis is present. Pulmonic Valve: The pulmonic valve was normal in structure. Pulmonic valve regurgitation is not visualized.  No evidence of pulmonic stenosis. Aorta: The aortic root is normal in size and structure. Venous: The inferior vena cava is normal in size with greater than 50% respiratory variability, suggesting right atrial pressure of 3 mmHg. IAS/Shunts: No atrial level shunt detected by color flow Doppler.  LEFT VENTRICLE PLAX 2D LVIDd:         3.40 cm     Diastology LVIDs:         2.40 cm     LV e' lateral:   4.46 cm/s LV PW:         1.00 cm     LV E/e' lateral: 12.5 LV IVS:        1.10 cm     LV e' medial:    5.22 cm/s LVOT diam:     2.00 cm     LV E/e' medial:  10.7 LV SV:         34 LV SV Index:   17 LVOT Area:     3.14 cm  LV Volumes (MOD) LV vol d, MOD A2C: 78.6 ml LV vol d, MOD A4C: 93.2 ml LV vol s, MOD A2C: 38.6 ml LV vol s, MOD A4C: 50.6 ml LV SV MOD A2C:     40.0 ml LV SV MOD A4C:     93.2 ml LV SV MOD BP:      40.3 ml RIGHT VENTRICLE RV S prime:     27.60 cm/s LEFT ATRIUM  Index       RIGHT ATRIUM          Index LA diam:      2.60 cm 1.30 cm/m  RA Area:     9.78 cm LA Vol (A2C): 28.5 ml 14.29 ml/m RA Volume:   17.25 ml 8.65 ml/m LA Vol (A4C): 74.1 ml 37.14 ml/m  AORTIC VALVE LVOT Vmax:   75.00 cm/s LVOT Vmean:  54.700 cm/s LVOT VTI:    0.108 m  AORTA Ao Root diam: 3.00 cm MITRAL VALVE MV Area (PHT): 6.43 cm    SHUNTS MV Decel Time: 118 msec    Systemic VTI:  0.11 m MV E velocity: 55.60 cm/s  Systemic Diam: 2.00 cm MV A velocity: 98.20 cm/s MV E/A ratio:  0.57 Cherlynn Kaiser MD Electronically signed by Cherlynn Kaiser MD Signature Date/Time: 12/13/2019/7:29:39 PM    Final    Assessment and Plan:   Metastatic cholangiocarcinoma -Outside records have been reviewed through care everywhere. -Ms. Convey has received 3 cycles of gemcitabine and cisplatin at [facility] (it appears as though she missed day 1 of cycle 2 secondary to hospitalization). -She is having difficulty getting to appointments for her cancer care at [facility] due to distance. -She is expressed that she  would like to transition her care back to [facility]. -We discussed that we can make these arrangements when she is discharged. -Additionally, we discussed that additional systemic therapy will need to be delayed until after completion of treatment for her bacteremia.  Bacteremia -Blood cultures on admission positive for corynebacterium -Port-A-Cath removed 12/15/2019 -Remains on IV antibiotics -ID planning on 14 days of treatment -Repeat blood cultures obtained earlier today and are pending -PICC line to be placed tomorrow if blood cultures are negative  Pancytopenia, improving -Pancytopenia likely due to her recent chemotherapy and acute infection -Status post 2 units PRBCs and 1 unit of platelets -White blood cell count and platelets have recovered, but hemoglobin still low -Monitor closely and transfuse for hemoglobin less than 7  Moderate to large pericardial effusion -No evidence of cardiac tamponade -Cardiology has been consulted and they are closely monitoring  Recent history of MCA CVA with resultant dysarthria -Eliquis was placed on hold this admission secondary to thrombocytopenia and secondary to procedures -Recommend restarting Eliquis once procedures are completed  AKI on CKD -Creatinine has been fluctuating this admission -Medications are being renally dosed  Thank you for this referral.   Mikey Bussing, DNP, AGPCNP-BC, AOCNP

## 2019-12-16 NOTE — Progress Notes (Signed)
RN notified by telemetry that patient had a 2-3 second burst of SVT; stripped saved

## 2019-12-16 NOTE — Progress Notes (Signed)
PROGRESS NOTE  Melissa Hurley BFX:[id number] DOB: [birth year] DOA: 12/10/2019 PCP: Patient, No Pcp Per  Brief hospital course: Past medical history of, metastatic intrahepatic cholangiocarcinoma, followed at [facility] and currently undergoing tx cisplatin/gemcitabine, who was recently hospitalized in 10/2019 due to acute left MCA CVA and started on eliquis presents withnausea, vomiting, poor p.o. intake.  Found to have hypokalemia, hypomagnesemia, hypophosphatemia as well as pancytopenia with undetectable platelets.     HPI/Recap of past 24 hours:   No fever, she is getting stronger, she did well with speech therapist  She is currently on line holiday, having difficulty  for blood draw due to poor veins  Assessment/Plan: Principal Problem:   Pancytopenia ([place]) Active Problems:   Nausea & vomiting   Dehydration   AKI (acute kidney injury) ([place])   SIRS (systemic inflammatory response syndrome) ([place])   Metastatic cholangiocarcinoma to bone ([place])   History of CVA (cerebrovascular accident)   Hypokalemia   Hypocalcemia   Bacteremia   Pericardial effusion  Bacteremia/febrile neutropenia/sepsis on presentation with fever 102.7, tachycardia, tachypnea, lactic acidosis Improving port removed on April 28, repeat blood culture obtained today She is continued on IV vancomycin Infections disease input appreciated, will follow recommendation   Moderate to large pericardial effusion. The pericardial effusion is anterior to the right ventricle and surrounding the apex.  There is no evidence of cardiac tamponade.  Cardiology consulted, input appreciated   Nonsustained SVT Keep K above 4 and mag above 2 Start Lopressor with holding parameters Keep on telemetry Cardiology following  Stage IV metastatic intrahepatic cholangiocarcinoma , present with pancytopenia, post chemo Present generalized fatigue found to have hemoglobin 6.5, platelets undetectable,  neutropenia. Patient has received 2 PRBC and 1 platelet transfusion. No active bleeding. WBC and platelet improving, hemoglobin stable Patient desired to switch oncology care back to cancer center here, I have discussed case with Dr. Lorenso Courier who will consult.  Report nausea and vomiting at home None in the hospital  Report poor oral intake at home due to loss of appetite, denies of dysphagia She was observed choking on pills, hold off oral medication, change to IV medication, aspiration precaution, speech eval She is getting stronger, she did well this morning with speech eval Continue aspiration precaution  Diarrhea Report associated with IV antibiotics C. difficile negative, GI PCR panel negative Supportive care  Hypokalemia, replaced and normalized , Hypomagnesemia, replace mag to keep mag above 2 Hypophosphatemia, replaced ,normalized Hypocalcemia, will give calcium gluconate  AKI on CKD 2 BUN 21 creatinine 1.56 on presentation BUN 22 creatinine 1.23 today Creatinine fluctuating, renal dosing meds  Recent left MCA CVA with dysarthria Started on Eliquis. In the setting of thrombocytopenia currently holding all medications. Platelet improved, will continue to hold  Eliquis , as she likely will need to have port replaced    FTT, Poor prognosis, it appeared that her oncologist set up palliative care visit on 4/15, she currently declined palliative care while she is in the hospital  DVT Prophylaxis: SCDs  Code Status: Full  Family Communication: patient   Disposition Plan:    Patient came from:                         home  Anticipated d/c place: she would like to set up home health services   Barriers to d/c OR conditions which need to be met to effect a safe d/c:  Treating bacteremia   Consultants:  ID  IR  Hematology oncology  Procedures:  remove  Port-A-Cath  Antibiotics:  IV Vanco   Objective: BP 124/66   Pulse 98   Temp 98.7 F (37.1 C) (Oral)   Resp 16   Ht 5' 6"  (1.676 m)   Wt 90.2 kg   LMP 10/25/2014   SpO2 100%   BMI 32.10 kg/m   Intake/Output Summary (Last 24 hours) at 12/16/2019 1053 Last data filed at 12/16/2019 0300 Gross per 24 hour  Intake 440 ml  Output --  Net 440 ml   Filed Weights   12/10/19 1430 12/10/19 2222  Weight: 93 kg 90.2 kg    Exam: Patient is examined daily including today on 12/16/2019, exams remain the same as of yesterday except that has changed    General:  Weak,  NAD  Cardiovascular: RRR  Respiratory: CTABL  Abdomen: Soft/ND/NT, positive BS  Musculoskeletal: trace bilateral pedal edema  Neuro: alert, oriented x3, slowed speech  Data Reviewed: Basic Metabolic Panel: Recent Labs  Lab 12/11/19 1302 12/11/19 1302 12/12/19 0739 12/12/19 1555 12/13/19 0322 12/13/19 1555 12/14/19 0331 12/15/19 0439 12/16/19 0543  NA 136   < > 137   < > 137 137 139 140 138  K 3.2*   < > 3.1*   < > 3.5 3.0* 3.4* 3.7 4.1  CL 106   < > 109   < > 109 106 109 110 107  CO2 21*   < > 17*   < > 20* 18* 22 15* 20*  GLUCOSE 143*   < > 90   < > 95 123* 89 89 90  BUN 21*   < > 19   < > 21* 23* 21* 21* 22*  CREATININE 1.56*   < > 1.29*   < > 1.31* 1.52* 1.17* 1.25* 1.23*  CALCIUM 5.7*   < > 5.6*   < > 6.0* 6.6* 6.5* 6.8* 6.5*  MG 1.2*   < > 1.6*   < > 2.0 1.9 1.8 1.7 1.9  PHOS <1.0*  --  2.0*  --  2.4*  --  2.8 3.0  --    < > = values in this interval not displayed.   Liver Function Tests: Recent Labs  Lab 12/10/19 1422 12/11/19 1302  AST 22 26  ALT 16 15  ALKPHOS 88 91  BILITOT 0.7 1.1  PROT 5.5* 4.9*  ALBUMIN 2.4* 2.2*   Recent Labs  Lab 12/10/19 1422  LIPASE 19   No results for input(s): AMMONIA in the last 168 hours. CBC: Recent Labs  Lab 12/11/19 1302 12/11/19 1302 12/12/19 0739 12/13/19 0322 12/14/19 0331 12/15/19 0439 12/16/19 0543  WBC 1.5*   < > 1.3* 1.8* 2.7*  5.2 6.4  NEUTROABS 0.5*  --   --  0.2* 0.3* 1.1* 2.2  HGB 8.7*   < > 8.4* 8.9* 8.8* 9.5* 8.6*  HCT 26.5*   < > 25.7* 28.1* 27.4* 29.3* 27.3*  MCV 81.3   < > 82.6 82.4 80.8 81.4 83.2  PLT 26*   < > 28* 50* 71* 125* 163   < > = values in this interval not displayed.   Cardiac Enzymes:   No results for input(s): CKTOTAL, CKMB, CKMBINDEX, TROPONINI in the last 168 hours. BNP (last 3 results) No  results for input(s): BNP in the last 8760 hours.  ProBNP (last 3 results) No results for input(s): PROBNP in the last 8760 hours.  CBG: Recent Labs  Lab 12/16/19 0637  GLUCAP 84    Recent Results (from the past 240 hour(s))  Blood culture (routine x 2)     Status: Abnormal   Collection Time: 12/10/19  3:28 PM   Specimen: BLOOD  Result Value Ref Range Status   Specimen Description BLOOD SITE NOT SPECIFIED  Final   Special Requests   Final    BOTTLES DRAWN AEROBIC AND ANAEROBIC Blood Culture adequate volume   Culture  Setup Time   Final    GRAM POSITIVE RODS AEROBIC BOTTLE ONLY CRITICAL RESULT CALLED TO, READ BACK BY AND VERIFIED WITH: L. CURRAN,PHARMD 2127 12/11/2019 T. TYSOR    Culture (A)  Final    DIPHTHEROIDS(CORYNEBACTERIUM SPECIES) Standardized susceptibility testing for this organism is not available. Performed at [facility], [place] [address], [place], [place] [address]    Report Status 12/12/2019 FINAL  Final  Respiratory Panel by RT PCR (Flu A&B, Covid) - Nasopharyngeal Swab     Status: None   Collection Time: 12/10/19  6:03 PM   Specimen: Nasopharyngeal Swab  Result Value Ref Range Status   SARS Coronavirus 2 by RT PCR NEGATIVE NEGATIVE Final    Comment: (NOTE) SARS-CoV-2 target nucleic acids are NOT DETECTED. The SARS-CoV-2 RNA is generally detectable in upper respiratoy specimens during the acute phase of infection. The lowest concentration of SARS-CoV-2 viral copies this assay can detect is 131 copies/mL. A negative result does not preclude  SARS-Cov-2 infection and should not be used as the sole basis for treatment or other patient management decisions. A negative result may occur with  improper specimen collection/handling, submission of specimen other than nasopharyngeal swab, presence of viral mutation(s) within the areas targeted by this assay, and inadequate number of viral copies (<131 copies/mL). A negative result must be combined with clinical observations, patient history, and epidemiological information. The expected result is Negative. Fact Sheet for Patients:  PinkCheek.be Fact Sheet for Healthcare Providers:  GravelBags.it This test is not yet ap proved or cleared by the [place] FDA and  has been authorized for detection and/or diagnosis of SARS-CoV-2 by FDA under an Emergency Use Authorization (EUA). This EUA will remain  in effect (meaning this test can be used) for the duration of the COVID-19 declaration under Section 564(b)(1) of the Act, 21 U.S.C. section 360bbb-3(b)(1), unless the authorization is terminated or revoked sooner.    Influenza A by PCR NEGATIVE NEGATIVE Final   Influenza B by PCR NEGATIVE NEGATIVE Final    Comment: (NOTE) The Xpert Xpress SARS-CoV-2/FLU/RSV assay is intended as an aid in  the diagnosis of influenza from Nasopharyngeal swab specimens and  should not be used as a sole basis for treatment. Nasal washings and  aspirates are unacceptable for Xpert Xpress SARS-CoV-2/FLU/RSV  testing. Fact Sheet for Patients: PinkCheek.be Fact Sheet for Healthcare Providers: GravelBags.it This test is not yet approved or cleared by the [place] FDA and  has been authorized for detection and/or diagnosis of SARS-CoV-2 by  FDA under an Emergency Use Authorization (EUA). This EUA will remain  in effect (meaning this test can be used) for the duration of the  Covid-19  declaration under Section 564(b)(1) of the Act, 21  U.S.C. section 360bbb-3(b)(1), unless the authorization is  terminated or revoked. Performed at [facility], [place],  [place] [address]   Blood culture (routine x 2)     Status: Abnormal   Collection Time: 12/10/19  8:47 PM   Specimen: BLOOD  Result Value Ref Range Status   Specimen Description BLOOD SITE NOT SPECIFIED  Final   Special Requests   Final    BOTTLES DRAWN AEROBIC AND ANAEROBIC Blood Culture results may not be optimal due to an inadequate volume of blood received in culture bottles   Culture  Setup Time   Final    GRAM POSITIVE RODS AEROBIC BOTTLE ONLY CRITICAL RESULT CALLED TO, READ BACK BY AND VERIFIED WITH: L. CURRAN,PHARMD 2127 12/11/2019 T. TYSOR    Culture (A)  Final    CORYNEBACTERIUM, GROUP JK Standardized susceptibility testing for this organism is not available. Performed at [facility], [place] [address], [place], [place] [address]    Report Status 12/12/2019 FINAL  Final  C Difficile Quick Screen w PCR reflex     Status: None   Collection Time: 12/11/19 10:12 AM   Specimen: STOOL  Result Value Ref Range Status   C Diff antigen NEGATIVE NEGATIVE Final   C Diff toxin NEGATIVE NEGATIVE Final   C Diff interpretation No C. difficile detected.  Final    Comment: Performed at [facility], [place] [address] [place], [place] [address]  Gastrointestinal Panel by PCR , Stool     Status: None   Collection Time: 12/11/19 10:12 AM   Specimen: Stool  Result Value Ref Range Status   Campylobacter species NOT DETECTED NOT DETECTED Final   Plesimonas shigelloides NOT DETECTED NOT DETECTED Final   Salmonella species NOT DETECTED NOT DETECTED Final   Yersinia enterocolitica NOT DETECTED NOT DETECTED Final   Vibrio species NOT DETECTED NOT DETECTED Final   Vibrio cholerae NOT DETECTED NOT DETECTED Final   Enteroaggregative E coli (EAEC) NOT DETECTED NOT DETECTED Final   Enteropathogenic  E coli (EPEC) NOT DETECTED NOT DETECTED Final   Enterotoxigenic E coli (ETEC) NOT DETECTED NOT DETECTED Final   Shiga like toxin producing E coli (STEC) NOT DETECTED NOT DETECTED Final   Shigella/Enteroinvasive E coli (EIEC) NOT DETECTED NOT DETECTED Final   Cryptosporidium NOT DETECTED NOT DETECTED Final   Cyclospora cayetanensis NOT DETECTED NOT DETECTED Final   Entamoeba histolytica NOT DETECTED NOT DETECTED Final   Giardia lamblia NOT DETECTED NOT DETECTED Final   Adenovirus F40/41 NOT DETECTED NOT DETECTED Final   Astrovirus NOT DETECTED NOT DETECTED Final   Norovirus GI/GII NOT DETECTED NOT DETECTED Final   Rotavirus A NOT DETECTED NOT DETECTED Final   Sapovirus (I, II, IV, and V) NOT DETECTED NOT DETECTED Final    Comment: Performed at [facility], [place] [place], [place] [address]  Culture, blood (x 2)     Status: Abnormal   Collection Time: 12/12/19  7:39 AM   Specimen: BLOOD  Result Value Ref Range Status   Specimen Description BLOOD SITE NOT SPECIFIED  Final   Special Requests   Final    BOTTLES DRAWN AEROBIC ONLY Blood Culture results may not be optimal due to an inadequate volume of blood received in culture bottles   Culture  Setup Time   Final    AEROBIC BOTTLE ONLY GRAM POSITIVE RODS CRITICAL VALUE NOTED.  VALUE IS CONSISTENT WITH PREVIOUSLY REPORTED AND CALLED VALUE.    Culture (A)  Final    CORYNEBACTERIUM, GROUP JK Standardized susceptibility testing for this organism is not available. Performed at [facility], [address], [place], [place] [address]    Report Status 12/15/2019 FINAL  Final  Culture, blood (x 2)     Status: Abnormal   Collection Time: 12/12/19  7:53 AM   Specimen: BLOOD  Result Value Ref Range Status   Specimen Description BLOOD SITE NOT SPECIFIED  Final   Special Requests   Final    BOTTLES DRAWN AEROBIC ONLY Blood Culture results may not be optimal due to an inadequate volume of blood received in culture bottles    Culture  Setup Time   Final    AEROBIC BOTTLE ONLY GRAM POSITIVE RODS CRITICAL VALUE NOTED.  VALUE IS CONSISTENT WITH PREVIOUSLY REPORTED AND CALLED VALUE.    Culture (A)  Final    CORYNEBACTERIUM, GROUP JK Standardized susceptibility testing for this organism is not available. Performed at [facility], [place] [address], [place], [place] [address]    Report Status 12/15/2019 FINAL  Final  Cath Tip Culture     Status: None (Preliminary result)   Collection Time: 12/15/19  2:42 PM   Specimen: Catheter Tip; Other  Result Value Ref Range Status   Specimen Description CATH TIP PORTA CATH  Final   Special Requests NONE  Final   Culture   Final    NO GROWTH < 24 HOURS Performed at [facility], [place] [address], [place], [place] [address]    Report Status PENDING  Incomplete     Studies: IR REMOVAL TUN ACCESS W/ PORT W/O FL MOD SED  Result Date: 12/15/2019 INDICATION: 54 year old female with a history of right port catheter infection referred for removal EXAM: REMOVAL RIGHT IJ VEIN PORT-A-CATH MEDICATIONS: 2 g Ancef; The antibiotic was administered within an appropriate time interval prior to skin puncture. ANESTHESIA/SEDATION: Moderate (conscious) sedation was employed during this procedure. A total of Versed 2.0 mg and Fentanyl 100 mcg was administered intravenously. Moderate Sedation Time: 25 minutes. The patient's level of consciousness and vital signs were monitored continuously by radiology nursing throughout the procedure under my direct supervision. FLUOROSCOPY TIME:  None COMPLICATIONS: None PROCEDURE: Informed consent was obtained from the patient following an explanation of the procedure, risks, benefits and alternatives. The patient understands, agrees and consents for the procedure. All questions were addressed. A time out was performed prior to the initiation of the procedure. The patient was positioned in the operation suite in the supine position on a gantry. Visual  inspection demonstrates no redness at the site with no ballotable fluid collection. No purulence. The previous scar on the right chest was generously infiltrated with 1% lidocaine for local anesthesia. Infiltration of the skin and subcutaneous tissues surrounding the port was performed. Using sharp and blunt dissection, the double-lumen port apparatus and subcutaneous catheter were removed in their entirety. No purulence within the pocket which was in good repair. Generous irrigation was performed. The port pocket was then closed with interrupted Vicryl layer and a running subcuticular with 4-0 Monocryl. The skin was sealed with Derma bond. A sterile dressing was placed. Tip culture was sent. The patient tolerated the procedure well and remained hemodynamically stable throughout. No complications were encountered and no significant blood loss was encountered. IMPRESSION: Status post right IJ port catheter removal. Signed, Dulcy Fanny. Dellia Nims, RPVI [facility] Radiology Electronically Signed   By: Corrie Mckusick D.O.   On: 12/15/2019 16:36    Scheduled Meds: . sodium chloride   Intravenous Once  . Chlorhexidine Gluconate Cloth  6  each Topical Daily  . feeding supplement (ENSURE ENLIVE)  237 mL Oral TID BM  . metoprolol tartrate  12.5 mg Oral BID  . sodium chloride flush  10-40 mL Intracatheter Q12H  . sodium chloride flush  3 mL Intravenous Once  . sodium chloride flush  3 mL Intravenous Q12H    Continuous Infusions: . lactated ringers 100 mL/hr at 12/15/19 1500  . vancomycin 1,000 mg (12/15/19 1501)     Time spent: 25mns, I have personally reviewed and interpreted on  12/16/2019 daily labs, tele strips, imagings as discussed above under date review session and assessment and plans.  I reviewed all nursing notes, pharmacy notes, consultant notes,  vitals, pertinent old records  I have discussed plan of care as described above with RN , patient   on 12/16/2019   FFlorencia ReasonsMD, PhD, FACP  [facility]  Available via Epic secure chat 7am-7pm for nonurgent issues Please page for urgent issues, pager number available through [email or web address] .   12/16/2019, 10:53 AM  LOS: 6 days

## 2019-12-16 NOTE — Progress Notes (Signed)
PHARMACY CONSULT NOTE FOR:  OUTPATIENT  PARENTERAL ANTIBIOTIC THERAPY (OPAT)  Indication: port infection, bacteremia Regimen: Vancomycin 750mg  IV q24h End date: 12/29/2019  IV antibiotic discharge orders are pended. To discharging provider:  please sign these orders via discharge navigator,  Select New Orders & click on the button choice - Manage This Unsigned Work.     Thank you for allowing pharmacy to be a part of this patient's care.  Luiz Ochoa 12/16/2019, 5:51 PM

## 2019-12-16 NOTE — TOC Initial Note (Signed)
Transition of Care [facility]) - Initial/Assessment Note    Patient Details  Name: Melissa Hurley MRN: [id number] Date of Birth: [birth year]  Transition of Care [facility]) CM/SW Contact:    Pollie Friar, RN Phone Number: 12/16/2019, 2:28 PM  Clinical Narrative:                 CM met with the patient and she is interested in using [facility] services for home hydration and possible home IV abx. She has used [facility] recently and would like to use them again. Cory with [facility] accepted.  Pam with [facility] will meet with the patient to do education and provide needed abx and IVF.  TOC following.  Expected Discharge Plan: [place] Barriers to Discharge: Continued Medical Work up   Patient Goals and CMS Choice   CMS Medicare.gov Compare Post Acute Care list provided to:: Patient Choice offered to / list presented to : Patient  Expected Discharge Plan and Services Expected Discharge Plan: [place]   Discharge Planning Services: CM Consult Post Acute Care Choice: [place] arrangements for the past 2 months: [place] Arranged: RN, PT [facility] Agency: [place] Date [facility] Agency Contacted: 12/16/19   Representative spoke with at [place]: Tommi Rumps  Prior Living Arrangements/Services Living arrangements for the past 2 months: [place] Lives with:: Spouse Patient language and need for interpreter reviewed:: Yes Do you feel safe going back to the place where you live?: Yes      Need for Family Participation in Patient Care: Yes (Comment) Care giver support system in place?: No (comment)   Criminal Activity/Legal Involvement Pertinent to Current Situation/Hospitalization: No - Comment as needed  Activities of Daily Living Home Assistive Devices/Equipment: None ADL Screening (condition at time of admission) Patient's cognitive ability adequate to safely complete daily activities?: Yes Is the  patient deaf or have difficulty hearing?: No Does the patient have difficulty seeing, even when wearing glasses/contacts?: No Does the patient have difficulty concentrating, remembering, or making decisions?: No Patient able to express need for assistance with ADLs?: Yes Does the patient have difficulty dressing or bathing?: No Independently performs ADLs?: Yes (appropriate for developmental age) Communication: Independent Dressing (OT): Independent Grooming: Independent Does the patient have difficulty walking or climbing stairs?: Yes Weakness of Legs: Left Weakness of Arms/Hands: None  Permission Sought/Granted                  Emotional Assessment Appearance:: Appears stated age Attitude/Demeanor/Rapport: Engaged Affect (typically observed): Accepting Orientation: : Oriented to Self, Oriented to Place, Oriented to  Time, Oriented to Situation   Psych Involvement: No (comment)  Admission diagnosis:  Pancytopenia due to chemotherapy ([place]) [D61.810] Pancytopenia ([place]) [D61.818] Symptomatic anemia [D64.9] Patient Active Problem List   Diagnosis Date Noted  . Bacteremia   . Pericardial effusion   . Pancytopenia ([place]) 12/10/2019  . Nausea & vomiting 12/10/2019  . Dehydration 12/10/2019  . AKI (acute kidney injury) ([place]) 12/10/2019  . SIRS (systemic inflammatory response syndrome) ([place]) 12/10/2019  . Metastatic cholangiocarcinoma to bone ([place]) 12/10/2019  . History of CVA (cerebrovascular accident) 12/10/2019  . Hypokalemia 12/10/2019  . Hypocalcemia 12/10/2019  . Acute CVA (cerebrovascular accident) ([place]) 10/25/2019  . Anemia 10/25/2019  . Fibroid uterus 08/27/2019   PCP:  Patient, No  Pcp Per Pharmacy:   [facility] [address] [place] [address] [place] [place] [address] Phone: [phone] Fax: [phone]     Social Determinants of Health (SDOH) Interventions    Readmission Risk Interventions No flowsheet data  found.

## 2019-12-16 NOTE — Progress Notes (Addendum)
Progress Note  Patient Name: Melissa Hurley Date of Encounter: 12/16/2019  Primary Cardiologist: Will Meredith Leeds, MD   Subjective   Patient states she is feeling well. No chest pain, shortness of breath, palpitations, lightheadedness, dizziness, or near syncope.  Inpatient Medications    Scheduled Meds: . sodium chloride   Intravenous Once  . Chlorhexidine Gluconate Cloth  6 each Topical Daily  . feeding supplement (ENSURE ENLIVE)  237 mL Oral TID BM  . metoprolol tartrate  12.5 mg Oral BID  . sodium chloride flush  10-40 mL Intracatheter Q12H  . sodium chloride flush  3 mL Intravenous Once  . sodium chloride flush  3 mL Intravenous Q12H   Continuous Infusions: . calcium gluconate    . vancomycin 1,000 mg (12/16/19 1214)   PRN Meds: acetaminophen **OR** acetaminophen, LORazepam, metoprolol tartrate, ondansetron (ZOFRAN) IV, sodium chloride flush   Vital Signs    Vitals:   12/15/19 2313 12/16/19 0030 12/16/19 0300 12/16/19 0737  BP:  (!) 125/52 (!) 118/52 124/66  Pulse: 84 90 93 98  Resp: 20 20 (!) 23 16  Temp:  98.4 F (36.9 C) 97.8 F (36.6 C) 98.7 F (37.1 C)  TempSrc:  Oral Oral Oral  SpO2: 100% 100% 100% 100%  Weight:      Height:        Intake/Output Summary (Last 24 hours) at 12/16/2019 1318 Last data filed at 12/16/2019 0300 Gross per 24 hour  Intake 440 ml  Output --  Net 440 ml   Last 3 Weights 12/10/2019 12/10/2019 10/25/2019  Weight (lbs) 198 lb 13.7 oz 205 lb 210 lb  Weight (kg) 90.2 kg 92.987 kg 95.255 kg      Telemetry    Sinus rhythm rates in the 90's. - Personally Reviewed  ECG    No new ECG tracing today.- Personally Reviewed  Physical Exam   GEN: No acute distress.   Neck: Supple. No JVD. Cardiac: RRR. No murmurs, rubs, or gallops.  Respiratory: Clear to auscultation bilaterally. GI: Soft, non-distended, non-tender. Bowel sounds present. MS: No lowe extremity edema. No deformity. Skin: Warm and dry. Neuro:  No focal  deficits. Psych: Normal affect   Labs    High Sensitivity Troponin:  No results for input(s): TROPONINIHS in the last 720 hours.    Chemistry Recent Labs  Lab 12/10/19 1422 12/10/19 1808 12/11/19 1302 12/12/19 0739 12/14/19 0331 12/15/19 0439 12/16/19 0543  NA 137   < > 136   < > 139 140 138  K 3.1*   < > 3.2*   < > 3.4* 3.7 4.1  CL 104   < > 106   < > 109 110 107  CO2 19*   < > 21*   < > 22 15* 20*  GLUCOSE 120*   < > 143*   < > 89 89 90  BUN 21*   < > 21*   < > 21* 21* 22*  CREATININE 1.49*   < > 1.56*   < > 1.17* 1.25* 1.23*  CALCIUM 6.0*   < > 5.7*   < > 6.5* 6.8* 6.5*  PROT 5.5*  --  4.9*  --   --   --   --   ALBUMIN 2.4*  --  2.2*  --   --   --   --   AST 22  --  26  --   --   --   --   ALT 16  --  15  --   --   --   --  ALKPHOS 88  --  91  --   --   --   --   BILITOT 0.7  --  1.1  --   --   --   --   GFRNONAA 40*   < > 38*   < > 54* 49* 50*  GFRAA 46*   < > 44*   < > >60 57* 58*  ANIONGAP 14   < > 9   < > 8 15 11    < > = values in this interval not displayed.     Hematology Recent Labs  Lab 12/14/19 0331 12/15/19 0439 12/16/19 0543  WBC 2.7* 5.2 6.4  RBC 3.39* 3.60* 3.28*  HGB 8.8* 9.5* 8.6*  HCT 27.4* 29.3* 27.3*  MCV 80.8 81.4 83.2  MCH 26.0 26.4 26.2  MCHC 32.1 32.4 31.5  RDW 18.8* 19.5* 19.9*  PLT 71* 125* 163    BNPNo results for input(s): BNP, PROBNP in the last 168 hours.   DDimer No results for input(s): DDIMER in the last 168 hours.   Radiology    IR REMOVAL TUN ACCESS W/ PORT W/O FL MOD SED  Result Date: 12/15/2019 INDICATION: 54 year old female with a history of right port catheter infection referred for removal EXAM: REMOVAL RIGHT IJ VEIN PORT-A-CATH MEDICATIONS: 2 g Ancef; The antibiotic was administered within an appropriate time interval prior to skin puncture. ANESTHESIA/SEDATION: Moderate (conscious) sedation was employed during this procedure. A total of Versed 2.0 mg and Fentanyl 100 mcg was administered intravenously. Moderate  Sedation Time: 25 minutes. The patient's level of consciousness and vital signs were monitored continuously by radiology nursing throughout the procedure under my direct supervision. FLUOROSCOPY TIME:  None COMPLICATIONS: None PROCEDURE: Informed consent was obtained from the patient following an explanation of the procedure, risks, benefits and alternatives. The patient understands, agrees and consents for the procedure. All questions were addressed. A time out was performed prior to the initiation of the procedure. The patient was positioned in the operation suite in the supine position on a gantry. Visual inspection demonstrates no redness at the site with no ballotable fluid collection. No purulence. The previous scar on the right chest was generously infiltrated with 1% lidocaine for local anesthesia. Infiltration of the skin and subcutaneous tissues surrounding the port was performed. Using sharp and blunt dissection, the double-lumen port apparatus and subcutaneous catheter were removed in their entirety. No purulence within the pocket which was in good repair. Generous irrigation was performed. The port pocket was then closed with interrupted Vicryl layer and a running subcuticular with 4-0 Monocryl. The skin was sealed with Derma bond. A sterile dressing was placed. Tip culture was sent. The patient tolerated the procedure well and remained hemodynamically stable throughout. No complications were encountered and no significant blood loss was encountered. IMPRESSION: Status post right IJ port catheter removal. Signed, Dulcy Fanny. Dellia Nims, RPVI [facility] Radiology Electronically Signed   By: Corrie Mckusick D.O.   On: 12/15/2019 16:36    Cardiac Studies   Echocardiogram 12/13/2019: Impressions: 1. Left ventricular ejection fraction, by estimation, is 55 to 60%. The  left ventricle has normal function. The left ventricle has no regional  wall motion  abnormalities. There is mild left ventricular hypertrophy.  Indeterminate diastolic filling due to  E-A fusion.  2. Right ventricular systolic function is normal. The right ventricular  size is normal. Tricuspid regurgitation signal is inadequate for assessing  PA pressure.  3. Left atrial size was mildly  dilated.  4. Moderate to large pericardial effusion. The pericardial effusion is  anterior to the right ventricle and surrounding the apex. There is no  evidence of cardiac tamponade. No RV diastolic collapse, IVC appears small  and collapsible. Respirometer  evaluation not performed.  5. The mitral valve is normal in structure. No evidence of mitral valve  regurgitation. No evidence of mitral stenosis.  6. The aortic valve is tricuspid. Aortic valve regurgitation is mild. No  aortic stenosis is present.  7. The inferior vena cava is normal in size with greater than 50%  respiratory variability, suggesting right atrial pressure of 3 mmHg.   Conclusion(s)/Recommendation(s): No evidence of valvular vegetations on  this transthoracic echocardiogram. Would recommend a transesophageal  echocardiogram to exclude infective endocarditis if clinically indicated.   Patient Profile     54 y.o. female with a history of of metastatic intrahepatic cholangiocarcinoma followed at [facility] and currently undergoing treatment, previous (suspected malignant) pericardial effusion, CKD stage 2, recent CVA on Eliquis who is being seen today for the evaluation of pericardial effusion at the request of Dr. Erlinda Hong.  Assessment & Plan    Recurrent Pericardial Effusion - Likely secondary to metastatic biliary cancer.  - Echo showed LVEF of 55-60% with moderate to large pericardial effusion. However, Dr. Claiborne Billings personally reviewed Echo and felt more consistent with moderate effusion. No evidence of tamponade on Echo - no RV collapse and normal respiratory variability. - BP softer today but heart rates improved. No  signs of tamponade. No JVD or hepatojugular reflux.  - No need for pericardiocentesis at this time. Per Dr. Claiborne Billings, given high-likelihood of malignant effusion, pericardial window/biopsy could be considered if she fevers continues despite removal of portacath. Afebrile over the last 24 hours. - Continue to hold Eliquis due to thrombocytopenia and potential for conversion to hemorrhagic effusion. - Continue to monitor BP and signs of tamponade closely.   Otherwise, per primary team: - SIRS/Bacteremia s/p port-a-cath removal - Stage IV metastatic intrahepatic cholangiocarcinoma s/p chemo  - Pancytopenia s/p 2 units or RBCs and 1 platelet transfusion - HemOnc following - Acute on chronic kidney disease stage II - Recent CVA   For questions or updates, please contact [facility] Please consult [email or web address] for contact info under        Signed, Darreld Mclean, PA-C  12/16/2019, 1:18 PM    Patient seen and examined. Agree with assessment and plan.  Patient is resting comfortably fully without chest pain.  Tachycardia has improved and she is in sinus rhythm with heart rate in the 90s.  Blood pressure somewhat low today at 100/64.  There is no significant pulses paradoxic [place].  There is no JVD.  No Kussmaul sign.  We will continue to follow.  Echo did not reveal any tamponade findings.  Patient has been afebrile.  Continue to follow.  Platelet count has continued to improve, now 163 K today, sniffily improved from 26,000 on presentation.  Recommend serial echoes in future and if there is significant increase consider pericardial window with pericardial biopsy.   Troy Sine, MD, [facility] 12/16/2019 2:06 PM

## 2019-12-16 NOTE — Progress Notes (Addendum)
Pharmacy Antibiotic Note  Melissa Hurley is a 54 y.o. female admitted on 12/10/2019 with sepsis.  Pharmacy has been consulted for vancomycin dosing.  Pt is afebrile, WBC uptrended to wnl at 6.4. Scr 1.23. Pt port successfully removed 4/28. Vanc trough drawn today approximately 22 hours after last dose: 26. True trough estimated to be ~24, which is supratherapeutic. Changing to Vanc 750 mg Q24H based on calculated desired trough of 18.  Spoke with nurse about vanc hang time, as chart says vanc hung at 1214. Daniel RN stated Vanc hung at 1253, despite what chart says.  Plan: Vancomycin 750 mg Q24H. Monitor renal function, clinical signs of improvement. Recheck vanc levels at steady state if patient still in house.  Height: 5\' 6"  (167.6 cm) Weight: 90.2 kg (198 lb 13.7 oz) IBW/kg (Calculated) : 59.3  Temp (24hrs), Avg:98.4 F (36.9 C), Min:97.8 F (36.6 C), Max:99.4 F (37.4 C)  Recent Labs  Lab 12/10/19 1528 12/10/19 1530 12/10/19 1711 12/10/19 2048 12/11/19 1302 12/11/19 1302 12/12/19 0739 12/12/19 1555 12/13/19 0322 12/13/19 1555 12/14/19 0331 12/15/19 0439 12/16/19 0543 12/16/19 1250  WBC  --    < >   < >  --  1.5*   < > 1.3*  --  1.8*  --  2.7* 5.2 6.4  --   CREATININE  --   --    < >  --  1.56*   < > 1.29*   < > 1.31* 1.52* 1.17* 1.25* 1.23*  --   LATICACIDVEN 2.9*  --   --  2.2* 2.3*  --  1.2  --   --   --   --   --   --   --   VANCOTROUGH  --   --   --   --   --   --   --   --   --   --   --   --   --  26*   < > = values in this interval not displayed.    Estimated Creatinine Clearance: 60.6 mL/min (A) (by C-G formula based on SCr of 1.23 mg/dL (H)).    No Known Allergies  Cefepime 4/24>> 4/25 Zosyn x1 4/25 Merrem 4/25>>4/26 Vanc 4/25>>  4/23: BC x 2: Corynebacterium, Group JK in 2 out of 4 bottles. 4/24: Cdiff negative 4/23: COVID negative, Flu negative  Lenon Oms  Student-PharmD 12/16/2019 2:23 PM

## 2019-12-16 NOTE — Progress Notes (Signed)
[facility]  Date of Admission:  12/10/2019      Total days of antibiotics *6  Vancomycin     ASSESSMENT: Melissa Hurley is a 54 y.o. immunocompromised female here with corynebacterium, group JK bacteremia secondary to an infected port-a-cath. This has since been removed.  TTE was negative for vegetations with normal appearing valves, pericardial effusion overlying RV noted - cardiology has seen her and would continue to monitor at this time. Plan for window/centesis to be done should she continue to fever recommended to rule out malignant/purulent process.  She has had resolution of fevers and her WBC indices have recovered nicely treating infection. Blood cultures were redrawn this morning and preliminarily no growth. Would plan on PICC line placement tomorrow to give 24 hours of reassurance her bacteremia is cleared with discharge home tomorrow 4/30. Suspect 14 days total of treatment with Vancomycin will be needed following port removal. Given that this particular group of corynebacterium has been linked to cases of native valve endocarditis in case reports, will consider surveillance blood cultures 10-14 days following last dose antibiotic for reassurance.   Oncology has been consulted - her husband has also been keeping her primary [facility] team updated. She may need to interrupt her treatment with current serious infection.     PLAN: 1. PICC tomorrow 4/30 if blood cultures drawn 4/29 are no growth @ 24 hours  2. Continue vancomycin  3. Home Health to be arranged     Principal Problem:   Pancytopenia ([facility]) Active Problems:   Nausea & vomiting   Dehydration   AKI (acute kidney injury) ([facility])   SIRS (systemic inflammatory response syndrome) (HCC)   Metastatic cholangiocarcinoma to bone (HCC)   History of CVA (cerebrovascular accident)   Hypokalemia   Hypocalcemia   Bacteremia   Pericardial effusion   . sodium chloride   Intravenous Once    . Chlorhexidine Gluconate Cloth  6 each Topical Daily  . feeding supplement (ENSURE ENLIVE)  237 mL Oral TID BM  . metoprolol tartrate  12.5 mg Oral BID  . sodium chloride flush  10-40 mL Intracatheter Q12H  . sodium chloride flush  3 mL Intravenous Once  . sodium chloride flush  3 mL Intravenous Q12H    SUBJECTIVE: No new complaints. Hopeful she can go home today. Tearful as she wants to see her 68 yo daughter.  She was supposed to have another session for chemotherapy yesterday - her husband has been keeping in touch with oncology team and updating them.  Not symptomatic for pericardial effusion - her nurse notes she does escalate heart rate with effort to walk around > 120 bpm but it quickly resolves with rest. She does not notice any palpitations or SOB during these episodes. Admits she does not do much at home outside of making small meals and helping with some select home chores.    Review of Systems: Review of Systems  Constitutional: Negative for chills and fever.  Respiratory: Negative for cough.   Cardiovascular: Negative for chest pain.  Genitourinary: Negative for dysuria.  Musculoskeletal: Negative for myalgias.  Skin: Negative for rash.  Neurological: Positive for weakness. Negative for dizziness and headaches.    No Known Allergies  OBJECTIVE: Vitals:   12/15/19 2313 12/16/19 0030 12/16/19 0300 12/16/19 0737  BP:  (!) 125/52 (!) 118/52 124/66  Pulse: 84 90 93 98  Resp: 20 20 (!) 23 16  Temp:  98.4 F (36.9  C) 97.8 F (36.6 C) 98.7 F (37.1 C)  TempSrc:  Oral Oral Oral  SpO2: 100% 100% 100% 100%  Weight:      Height:       Body mass index is 32.1 kg/m.  Physical Exam HENT:     Mouth/Throat:     Mouth: No oral lesions.     Dentition: No dental abscesses.  Cardiovascular:     Rate and Rhythm: Normal rate and regular rhythm.     Heart sounds: Normal heart sounds.  Pulmonary:     Effort: Pulmonary effort is normal.     Breath sounds: Normal breath  sounds.  Abdominal:     General: There is no distension.     Palpations: Abdomen is soft.     Tenderness: There is no abdominal tenderness.  Musculoskeletal:        General: No tenderness. Normal range of motion.  Lymphadenopathy:     Cervical: No cervical adenopathy.  Skin:    General: Skin is warm and dry.     Findings: No rash.     Comments: Port site clean/dry   Neurological:     Mental Status: She is alert and oriented to person, place, and time.  Psychiatric:        Judgment: Judgment normal.     Lab Results Lab Results  Component Value Date   WBC 6.4 12/16/2019   HGB 8.6 (L) 12/16/2019   HCT 27.3 (L) 12/16/2019   MCV 83.2 12/16/2019   PLT 163 12/16/2019    Lab Results  Component Value Date   CREATININE 1.23 (H) 12/16/2019   BUN 22 (H) 12/16/2019   NA 138 12/16/2019   K 4.1 12/16/2019   CL 107 12/16/2019   CO2 20 (L) 12/16/2019    Lab Results  Component Value Date   ALT 15 12/11/2019   AST 26 12/11/2019   ALKPHOS 91 12/11/2019   BILITOT 1.1 12/11/2019     Microbiology: Recent Results (from the past 240 hour(s))  Blood culture (routine x 2)     Status: Abnormal   Collection Time: 12/10/19  3:28 PM   Specimen: BLOOD  Result Value Ref Range Status   Specimen Description BLOOD SITE NOT SPECIFIED  Final   Special Requests   Final    BOTTLES DRAWN AEROBIC AND ANAEROBIC Blood Culture adequate volume   Culture  Setup Time   Final    GRAM POSITIVE RODS AEROBIC BOTTLE ONLY CRITICAL RESULT CALLED TO, READ BACK BY AND VERIFIED WITH: L. CURRAN,PHARMD 2127 12/11/2019 T. TYSOR    Culture (A)  Final    DIPHTHEROIDS(CORYNEBACTERIUM SPECIES) Standardized susceptibility testing for this organism is not available. Performed at [facility], [place] [address] [place], [place] [address]    Report Status 12/12/2019 FINAL  Final  Respiratory Panel by RT PCR (Flu A&B, Covid) - Nasopharyngeal Swab     Status: None   Collection Time: 12/10/19  6:03 PM   Specimen:  Nasopharyngeal Swab  Result Value Ref Range Status   SARS Coronavirus 2 by RT PCR NEGATIVE NEGATIVE Final    Comment: (NOTE) SARS-CoV-2 target nucleic acids are NOT DETECTED. The SARS-CoV-2 RNA is generally detectable in upper respiratoy specimens during the acute phase of infection. The lowest concentration of SARS-CoV-2 viral copies this assay can detect is 131 copies/mL. A negative result does not preclude SARS-Cov-2 infection and should not be used as the sole basis for treatment or other patient management decisions. A negative result may occur  with  improper specimen collection/handling, submission of specimen other than nasopharyngeal swab, presence of viral mutation(s) within the areas targeted by this assay, and inadequate number of viral copies (<131 copies/mL). A negative result must be combined with clinical observations, patient history, and epidemiological information. The expected result is Negative. Fact Sheet for Patients:  PinkCheek.be Fact Sheet for Healthcare Providers:  GravelBags.it This test is not yet ap proved or cleared by the [place] FDA and  has been authorized for detection and/or diagnosis of SARS-CoV-2 by FDA under an Emergency Use Authorization (EUA). This EUA will remain  in effect (meaning this test can be used) for the duration of the COVID-19 declaration under Section 564(b)(1) of the Act, 21 U.S.C. section 360bbb-3(b)(1), unless the authorization is terminated or revoked sooner.    Influenza A by PCR NEGATIVE NEGATIVE Final   Influenza B by PCR NEGATIVE NEGATIVE Final    Comment: (NOTE) The Xpert Xpress SARS-CoV-2/FLU/RSV assay is intended as an aid in  the diagnosis of influenza from Nasopharyngeal swab specimens and  should not be used as a sole basis for treatment. Nasal washings and  aspirates are unacceptable for Xpert Xpress SARS-CoV-2/FLU/RSV  testing. Fact Sheet for  Patients: PinkCheek.be Fact Sheet for Healthcare Providers: GravelBags.it This test is not yet approved or cleared by the [place] FDA and  has been authorized for detection and/or diagnosis of SARS-CoV-2 by  FDA under an Emergency Use Authorization (EUA). This EUA will remain  in effect (meaning this test can be used) for the duration of the  Covid-19 declaration under Section 564(b)(1) of the Act, 21  U.S.C. section 360bbb-3(b)(1), unless the authorization is  terminated or revoked. Performed at [facility], [place] [address] [place], [place] [address]   Blood culture (routine x 2)     Status: Abnormal   Collection Time: 12/10/19  8:47 PM   Specimen: BLOOD  Result Value Ref Range Status   Specimen Description BLOOD SITE NOT SPECIFIED  Final   Special Requests   Final    BOTTLES DRAWN AEROBIC AND ANAEROBIC Blood Culture results may not be optimal due to an inadequate volume of blood received in culture bottles   Culture  Setup Time   Final    GRAM POSITIVE RODS AEROBIC BOTTLE ONLY CRITICAL RESULT CALLED TO, READ BACK BY AND VERIFIED WITH: L. CURRAN,PHARMD 2127 12/11/2019 T. TYSOR    Culture (A)  Final    CORYNEBACTERIUM, GROUP JK Standardized susceptibility testing for this organism is not available. Performed at [facility], [place] [address], [place], [place] [address]    Report Status 12/12/2019 FINAL  Final  C Difficile Quick Screen w PCR reflex     Status: None   Collection Time: 12/11/19 10:12 AM   Specimen: STOOL  Result Value Ref Range Status   C Diff antigen NEGATIVE NEGATIVE Final   C Diff toxin NEGATIVE NEGATIVE Final   C Diff interpretation No C. difficile detected.  Final    Comment: Performed at [facility], [place] [address], [place], [place] [address]  Gastrointestinal Panel by PCR , Stool     Status: None   Collection Time: 12/11/19 10:12 AM   Specimen: Stool  Result Value Ref Range  Status   Campylobacter species NOT DETECTED NOT DETECTED Final   Plesimonas shigelloides NOT DETECTED NOT DETECTED Final   Salmonella species NOT DETECTED NOT DETECTED Final   Yersinia enterocolitica NOT DETECTED NOT DETECTED Final   Vibrio species NOT  DETECTED NOT DETECTED Final   Vibrio cholerae NOT DETECTED NOT DETECTED Final   Enteroaggregative E coli (EAEC) NOT DETECTED NOT DETECTED Final   Enteropathogenic E coli (EPEC) NOT DETECTED NOT DETECTED Final   Enterotoxigenic E coli (ETEC) NOT DETECTED NOT DETECTED Final   Shiga like toxin producing E coli (STEC) NOT DETECTED NOT DETECTED Final   Shigella/Enteroinvasive E coli (EIEC) NOT DETECTED NOT DETECTED Final   Cryptosporidium NOT DETECTED NOT DETECTED Final   Cyclospora cayetanensis NOT DETECTED NOT DETECTED Final   Entamoeba histolytica NOT DETECTED NOT DETECTED Final   Giardia lamblia NOT DETECTED NOT DETECTED Final   Adenovirus F40/41 NOT DETECTED NOT DETECTED Final   Astrovirus NOT DETECTED NOT DETECTED Final   Norovirus GI/GII NOT DETECTED NOT DETECTED Final   Rotavirus A NOT DETECTED NOT DETECTED Final   Sapovirus (I, II, IV, and V) NOT DETECTED NOT DETECTED Final    Comment: Performed at [facility], [place] [place], [place] [address]  Culture, blood (x 2)     Status: Abnormal   Collection Time: 12/12/19  7:39 AM   Specimen: BLOOD  Result Value Ref Range Status   Specimen Description BLOOD SITE NOT SPECIFIED  Final   Special Requests   Final    BOTTLES DRAWN AEROBIC ONLY Blood Culture results may not be optimal due to an inadequate volume of blood received in culture bottles   Culture  Setup Time   Final    AEROBIC BOTTLE ONLY GRAM POSITIVE RODS CRITICAL VALUE NOTED.  VALUE IS CONSISTENT WITH PREVIOUSLY REPORTED AND CALLED VALUE.    Culture (A)  Final    CORYNEBACTERIUM, GROUP JK Standardized susceptibility testing for this organism is not available. Performed at [facility], [place] [address] [place], [place] [address]    Report Status 12/15/2019 FINAL  Final  Culture, blood (x 2)     Status: Abnormal   Collection Time: 12/12/19  7:53 AM   Specimen: BLOOD  Result Value Ref Range Status   Specimen Description BLOOD SITE NOT SPECIFIED  Final   Special Requests   Final    BOTTLES DRAWN AEROBIC ONLY Blood Culture results may not be optimal due to an inadequate volume of blood received in culture bottles   Culture  Setup Time   Final    AEROBIC BOTTLE ONLY GRAM POSITIVE RODS CRITICAL VALUE NOTED.  VALUE IS CONSISTENT WITH PREVIOUSLY REPORTED AND CALLED VALUE.    Culture (A)  Final    CORYNEBACTERIUM, GROUP JK Standardized susceptibility testing for this organism is not available. Performed at [facility], [place] [address], [place], [place] [address]    Report Status 12/15/2019 FINAL  Final  Cath Tip Culture     Status: None (Preliminary result)   Collection Time: 12/15/19  2:42 PM   Specimen: Catheter Tip; Other  Result Value Ref Range Status   Specimen Description CATH TIP PORTA CATH  Final   Special Requests NONE  Final   Culture   Final    NO GROWTH < 24 HOURS Performed at [facility], [place] [address], [place], [place] [address]    Report Status PENDING  Incomplete    [name], MSN, NP-C [facility] Infectious Disease [email or web address] Pager: [phone] Office: [phone] [place]: [phone]

## 2019-12-16 NOTE — Evaluation (Signed)
Physical Therapy Evaluation Patient Details Name: Melissa Hurley MRN: [id number] DOB: May 17, 1966 Today's Date: 12/16/2019   History of Present Illness  Pt is a 54 y/o female admitted secondary to nausea/vomiting and dehydration. Found to have bacteremia likely from her port and is s/p removal. Also found to have pericardial effusion secondary to cholangiocarcinoma. PMH includes cholangiocarcinoma with mets and CVA.   Clinical Impression  Pt admitted secondary to problem above with deficits below. Pt requiring min guard A for mobility within the room this session. Pt with mild unsteadiness, however, no overt LOB. Pt with brief spike in HR up to 146 bpm, upon sitting EOB, but quickly returned to low 100s. Pt asymptomatic. Notified RN. Feel pt would benefit from HHPT at d/c. Will continue to follow acutely to maximize functional mobility independence and safety.     Follow Up Recommendations Home health PT    Equipment Recommendations  None recommended by PT    Recommendations for Other Services       Precautions / Restrictions Precautions Precautions: Fall Restrictions Weight Bearing Restrictions: No      Mobility  Bed Mobility Overal bed mobility: Modified Independent             General bed mobility comments: Pt with brief spike in HR to 146 bpm upon sitting, however, returned   Transfers Overall transfer level: Needs assistance Equipment used: None Transfers: Sit to/from Stand Sit to Stand: Min guard         General transfer comment: Min guard for steadying assist.   Ambulation/Gait Ambulation/Gait assistance: Min guard Gait Distance (Feet): 30 Feet Assistive device: None Gait Pattern/deviations: Step-through pattern;Decreased stride length;Wide base of support Gait velocity: Decreased   General Gait Details: Mild unsteadiness noted. Pt requesting to stay in room. Min guard for safety. Pt HR at mid 120s throughout gait.   Stairs            Wheelchair  Mobility    Modified Rankin (Stroke Patients Only)       Balance Overall balance assessment: Needs assistance Sitting-balance support: No upper extremity supported;Feet supported Sitting balance-Leahy Scale: Good     Standing balance support: No upper extremity supported;During functional activity Standing balance-Leahy Scale: Fair                               Pertinent Vitals/Pain Pain Assessment: No/denies pain    Home Living Family/patient expects to be discharged to:: Private residence Living Arrangements: Spouse/significant other;Children Available Help at Discharge: Family;Available PRN/intermittently Type of Home: House Home Access: Stairs to enter Entrance Stairs-Rails: Can reach both Entrance Stairs-Number of Steps: 3 Home Layout: Two level;Bed/bath upstairs;Able to live on main level with bedroom/bathroom Home Equipment: None      Prior Function Level of Independence: Independent               Hand Dominance   Dominant Hand: Right    Extremity/Trunk Assessment   Upper Extremity Assessment Upper Extremity Assessment: Overall WFL for tasks assessed    Lower Extremity Assessment Lower Extremity Assessment: Generalized weakness    Cervical / Trunk Assessment Cervical / Trunk Assessment: Normal  Communication   Communication: Expressive difficulties  Cognition Arousal/Alertness: Awake/alert Behavior During Therapy: Flat affect Overall Cognitive Status: No family/caregiver present to determine baseline cognitive functioning  General Comments: Pt alert and oriented X4, however, did note some slowed processing. Getting slightly agitated with PT when PT was asking about symptoms. Educated about why PT was asking, given brief period of elevated HR.       General Comments General comments (skin integrity, edema, etc.): Pt reporting concerns of nursing staff leaving her soiled in bed. Notified  RN and AD.     Exercises     Assessment/Plan    PT Assessment Patient needs continued PT services  PT Problem List Decreased mobility;Decreased knowledge of use of DME;Decreased balance;Decreased strength;Decreased safety awareness       PT Treatment Interventions DME instruction;Stair training;Therapeutic activities;Balance training;Gait training;Functional mobility training;Therapeutic exercise;Patient/family education    PT Goals (Current goals can be found in the Care Plan section)  Acute Rehab PT Goals Patient Stated Goal: to go home PT Goal Formulation: With patient Time For Goal Achievement: 12/30/19 Potential to Achieve Goals: Good    Frequency Min 3X/week   Barriers to discharge        Co-evaluation               AM-PAC PT "6 Clicks" Mobility  Outcome Measure Help needed turning from your back to your side while in a flat bed without using bedrails?: None Help needed moving from lying on your back to sitting on the side of a flat bed without using bedrails?: None Help needed moving to and from a bed to a chair (including a wheelchair)?: A Little Help needed standing up from a chair using your arms (e.g., wheelchair or bedside chair)?: A Little Help needed to walk in hospital room?: A Little Help needed climbing 3-5 steps with a railing? : A Lot 6 Click Score: 19    End of Session Equipment Utilized During Treatment: Gait belt Activity Tolerance: Patient tolerated treatment well Patient left: in bed;with call bell/phone within reach Nurse Communication: Mobility status PT Visit Diagnosis: Unsteadiness on feet (R26.81);Other abnormalities of gait and mobility (R26.89)    Time: [id number] PT Time Calculation (min) (ACUTE ONLY): 19 min   Charges:   PT Evaluation $PT Eval Moderate Complexity: 1 Mod          Reuel Derby, PT, DPT  Acute Rehabilitation Services  Pager: [phone] Office: [phone]   Rudean Hitt 12/16/2019, 11:22  AM

## 2019-12-16 NOTE — Progress Notes (Addendum)
Nutrition Follow-up  DOCUMENTATION CODES:   Non-severe (moderate) malnutrition in context of chronic illness  INTERVENTION:  D/c Ensure Enlive, pt does not like  Boost Breeze po TID, each supplement provides 250 kcal and 9 grams of protein  17ml Pro-stat BID, each supplement provides 100 kcal and 15 grams of protein  Provided education on how to increase protein/calorie consumption  NUTRITION DIAGNOSIS:   Moderate Malnutrition related to chronic illness(cancer) as evidenced by energy intake < or equal to 75% for > or equal to 1 month, percent weight loss, mild fat depletion, mild muscle depletion, edema.   GOAL:   Patient will meet greater than or equal to 90% of their needs   MONITOR:   PO intake, Supplement acceptance, Labs, Weight trends, Skin  REASON FOR ASSESSMENT:   Malnutrition Screening Tool    ASSESSMENT:   54 y.o. female with medical history significant of metastatic intrahepatic cholangiocarcinoma currently undergoing chemotherapy presents with nausea, vomiting, poor p.o. intake, pancytopenia due to chemotherapy, and SIRS.  Discussed pt with RN.   Pt reports appetite has been poor for several months due to the start of chemotherapy, but states she has been doing a little better since being admitted. Pt complaining of food tasting like metal; discussed use of plastic/wooden utensils to help lessen this side effect (discussed with RN as well). Pt states she does not like Ensure or other milky products, but is agreeable to trying Boost Breeze and Pro-stat. Discussed methods for increasing po intake at home.   Per weight records, pt with a 13% weight loss in 4 months, which is significant for time frame.  PO Intake: 25-75% x last 7 recorded meals (53% average meal intake)  Pt currently receiving Ensure Enlive TID.   Labs and medications reviewed.  NUTRITION - FOCUSED PHYSICAL EXAM:    Most Recent Value  Orbital Region  No depletion  Upper Arm Region  Mild  depletion  Thoracic and Lumbar Region  Mild depletion  Buccal Region  Mild depletion  Temple Region  Mild depletion  Clavicle Bone Region  Mild depletion  Clavicle and Acromion Bone Region  Mild depletion  Scapular Bone Region  Mild depletion  Dorsal Hand  No depletion  Patellar Region  No depletion  Anterior Thigh Region  No depletion  Posterior Calf Region  No depletion  Edema (RD Assessment)  Mild  Hair  Reviewed  Eyes  Reviewed  Mouth  Reviewed  Skin  Reviewed  Nails  Reviewed       Diet Order:   Diet Order            DIET SOFT Room service appropriate? Yes; Fluid consistency: Thin  Diet effective now              EDUCATION NEEDS:   Education needs have been addressed  Skin:  Skin Assessment: Reviewed RN Assessment  Last BM:  4/27  Height:   Ht Readings from Last 1 Encounters:  12/10/19 5\' 6"  (1.676 m)    Weight:   Wt Readings from Last 1 Encounters:  12/10/19 90.2 kg    BMI:  Body mass index is 32.1 kg/m.  Estimated Nutritional Needs:   Kcal:  2100-2300  Protein:  110-120 grams  Fluid:  >/= 2 L/day    Larkin Ina, MS, RD, LDN RD pager number and weekend/on-call pager number located in [place].

## 2019-12-16 NOTE — Discharge Instructions (Signed)
Try out Unjury protein powder to add to your foods/drinks to increase protein intake. Check out the following tips too:  Suggestions For Increasing Calories And Protein  Several small meals a day are easier to eat and digest than three large ones. Space meals about 2 to 3 hours apart to maximize comfort.  Stop eating 2 to 3 hours before bed and sleep with your head elevated if gastric reflux (GERD) and heartburn are problems.  Do not eat your favorite foods if you are feeling bad. Save them for when you feel good!  Eat breakfast-type foods at any meal. Eggs are usually easy to eat and are great any time of the day. (The same goes for pancakes and waffles.)  Eat when you feel hungry. Most people have the greatest appetite in the morning because they have not eaten all night. If this is the best meal for you, then pile on those calories and other nutrients in the morning and at lunch. Then you can have a smaller dinner without losing total calories for the day.  Eat leftovers or nutritious snacks in the afternoon and early evening to round out your day.  Try homemade or commercially prepared nutrition bars and puddings, as well as calorie- and protein-rich liquid nutritional supplements. Benefits of Physical Activity Talk to your doctor about physical activity. Light or moderate physical activity can help maintain muscle and promote an appetite. Walking in the neighborhood or the local mall is a great way to get up, get out, and get moving. If you are unsteady on your feet, try walking around the dining room table. Save Room for [employer]! Drink most fluids between meals instead of with meals. (It is fine to have a sip to help swallow food at meal time.) Fluids (which usually have fewer calories and nutrients than solid food) can take up valuable space in your stomach.  Foods Recommended High-Protein Foods Milk products Add cheese to toast, crackers, sandwiches, baked potatoes,  vegetables, soups, noodles, meat, and fruit. Use reduced-fat (2%) or whole milk in place of water when cooking cereal and cream soups. Include cream sauces on vegetables and pasta. Add powdered milk to cream soups and mashed potatoes.  Eggs Have hard-cooked eggs readily available in the refrigerator. Chop and add to salads, casseroles, soups, and vegetables. Make a quick egg salad. All eggs should be well cooked to avoid the risk of harmful bacteria.  Meats, poultry, and fish Add leftover cooked meats to soups, casseroles, salads, and omelets. Make dip by mixing diced, chopped, or shredded meat with sour cream and spices.  Beans, legumes, nuts, and seeds Sprinkle nuts and seeds on cereals, fruit, and desserts such as ice cream, pudding, and custard. Also serve nuts and seeds on vegetables, salads, and pasta. Spread peanut butter on toast, bread, English muffins, and fruit, or blend it in a milk shake. Add beans and peas to salads, soups, casseroles, and vegetable dishes.  High-Calorie Foods Butter, margarine, and  oils Melt butter or margarine over potatoes, rice, pasta, and cooked vegetables. Add melted butter or margarine into soups and casseroles and spread on bread for sandwiches before spreading sandwich spread or peanut butter. Saut or stir-fry vegetables, meats, chicken and fish such as shrimp/scallops in olive or canola oil. A variety of oils add calories and can be used to [employer], chicken, or fish.  Milk products Add whipping cream to desserts, pancakes, waffles, fruit, and hot chocolate, and fold it into soups and casseroles. Add sour cream  to baked potatoes and vegetables.  Salad dressing Use regular (not low-fat or diet) mayonnaise and salad dressing on sandwiches and in dips with vegetables and fruit.   Sweets Add jelly and honey to bread and crackers. Add jam to fruit and ice cream and as a topping over cake.   Copyright 2020  [employer]. All rights  reserved.

## 2019-12-17 ENCOUNTER — Inpatient Hospital Stay (HOSPITAL_COMMUNITY): Payer: BC Managed Care – PPO

## 2019-12-17 ENCOUNTER — Inpatient Hospital Stay: Payer: Self-pay

## 2019-12-17 LAB — CBC WITH DIFFERENTIAL/PLATELET
Abs Immature Granulocytes: 0 10*3/uL (ref 0.00–0.07)
Basophils Absolute: 0 10*3/uL (ref 0.0–0.1)
Basophils Relative: 0 %
Eosinophils Absolute: 0 10*3/uL (ref 0.0–0.5)
Eosinophils Relative: 0 %
HCT: 29.8 % — ABNORMAL LOW (ref 36.0–46.0)
Hemoglobin: 9.4 g/dL — ABNORMAL LOW (ref 12.0–15.0)
Lymphocytes Relative: 10 %
Lymphs Abs: 0.9 10*3/uL (ref 0.7–4.0)
MCH: 26.2 pg (ref 26.0–34.0)
MCHC: 31.5 g/dL (ref 30.0–36.0)
MCV: 83 fL (ref 80.0–100.0)
Monocytes Absolute: 0.7 10*3/uL (ref 0.1–1.0)
Monocytes Relative: 8 %
Neutro Abs: 7.4 10*3/uL (ref 1.7–7.7)
Neutrophils Relative %: 82 %
Platelets: 183 10*3/uL (ref 150–400)
RBC: 3.59 MIL/uL — ABNORMAL LOW (ref 3.87–5.11)
RDW: 20.3 % — ABNORMAL HIGH (ref 11.5–15.5)
WBC: 9 10*3/uL (ref 4.0–10.5)
nRBC: 0.4 % — ABNORMAL HIGH (ref 0.0–0.2)
nRBC: 1 /100 WBC — ABNORMAL HIGH

## 2019-12-17 LAB — HEPATIC FUNCTION PANEL
ALT: 15 U/L (ref 0–44)
AST: 25 U/L (ref 15–41)
Albumin: 1.9 g/dL — ABNORMAL LOW (ref 3.5–5.0)
Alkaline Phosphatase: 180 U/L — ABNORMAL HIGH (ref 38–126)
Bilirubin, Direct: 0.2 mg/dL (ref 0.0–0.2)
Indirect Bilirubin: 0.8 mg/dL (ref 0.3–0.9)
Total Bilirubin: 1 mg/dL (ref 0.3–1.2)
Total Protein: 5.4 g/dL — ABNORMAL LOW (ref 6.5–8.1)

## 2019-12-17 LAB — BASIC METABOLIC PANEL
Anion gap: 9 (ref 5–15)
BUN: 23 mg/dL — ABNORMAL HIGH (ref 6–20)
CO2: 20 mmol/L — ABNORMAL LOW (ref 22–32)
Calcium: 6.5 mg/dL — ABNORMAL LOW (ref 8.9–10.3)
Chloride: 107 mmol/L (ref 98–111)
Creatinine, Ser: 1.17 mg/dL — ABNORMAL HIGH (ref 0.44–1.00)
GFR calc Af Amer: >60 mL/min (ref >60)
GFR calc non Af Amer: 54 mL/min — ABNORMAL LOW (ref >60)
Glucose, Bld: 92 mg/dL (ref 70–99)
Potassium: 3.7 mmol/L (ref 3.5–5.1)
Sodium: 136 mmol/L (ref 135–145)

## 2019-12-17 LAB — MAGNESIUM: Magnesium: 1.7 mg/dL (ref 1.7–2.4)

## 2019-12-17 LAB — GLUCOSE, CAPILLARY: Glucose-Capillary: 79 mg/dL (ref 70–99)

## 2019-12-17 MED ORDER — SODIUM CHLORIDE 0.9% FLUSH
10.0000 mL | Freq: Two times a day (BID) | INTRAVENOUS | Status: DC
  Administered 2019-12-17 – 2019-12-18 (×2): 10 mL

## 2019-12-17 MED ORDER — SODIUM CHLORIDE 0.9% FLUSH: 10.0000 mL | INTRAVENOUS | Status: DC | PRN

## 2019-12-17 MED ORDER — CHLORHEXIDINE GLUCONATE CLOTH 2 % EX PADS: 6.0000 | MEDICATED_PAD | Freq: Every day | CUTANEOUS | Status: DC

## 2019-12-17 MED ORDER — LOPERAMIDE HCL 2 MG PO CAPS
2.0000 mg | ORAL_CAPSULE | Freq: Four times a day (QID) | ORAL | Status: DC | PRN
  Administered 2019-12-17 – 2019-12-18 (×2): 2 mg via ORAL
  Filled 2019-12-17 (×2): qty 1

## 2019-12-17 MED ORDER — MAGNESIUM SULFATE 2 GM/50ML IV SOLN
2.0000 g | Freq: Once | INTRAVENOUS | Status: AC
  Administered 2019-12-17: 2 g via INTRAVENOUS
  Filled 2019-12-17: qty 50

## 2019-12-17 MED ORDER — GERHARDT'S BUTT CREAM
TOPICAL_CREAM | CUTANEOUS | Status: DC | PRN
  Filled 2019-12-17: qty 1

## 2019-12-17 MED ORDER — POTASSIUM CHLORIDE CRYS ER 20 MEQ PO TBCR
40.0000 meq | EXTENDED_RELEASE_TABLET | Freq: Once | ORAL | Status: DC
  Filled 2019-12-17: qty 2

## 2019-12-17 MED ORDER — METOPROLOL TARTRATE 25 MG PO TABS
25.0000 mg | ORAL_TABLET | Freq: Two times a day (BID) | ORAL | Status: DC
  Administered 2019-12-17 – 2019-12-18 (×2): 25 mg via ORAL
  Filled 2019-12-17 (×2): qty 1

## 2019-12-17 NOTE — Progress Notes (Signed)
Peripherally Inserted Central Catheter Placement  The IV Nurse has discussed with the patient and/or persons authorized to consent for the patient, the purpose of this procedure and the potential benefits and risks involved with this procedure.  The benefits include less needle sticks, lab draws from the catheter, and the patient may be discharged home with the catheter. Risks include, but not limited to, infection, bleeding, blood clot (thrombus formation), and puncture of an artery; nerve damage and irregular heartbeat and possibility to perform a PICC exchange if needed/ordered by physician.  Alternatives to this procedure were also discussed.  Bard Power PICC patient education guide, fact sheet on infection prevention and patient information card has been provided to patient /or left at bedside.    PICC Placement Documentation  PICC Single Lumen 12/17/19 PICC Left Brachial 43 cm 1 cm (Active)  Indication for Insertion or Continuance of Line Home intravenous therapies (PICC only) 12/17/19 1824  Exposed Catheter (cm) 1 cm 12/17/19 1824  Site Assessment Clean;Dry;Intact 12/17/19 1824  Line Status Flushed;Saline locked;Blood return noted 12/17/19 1824  Dressing Type Transparent 12/17/19 1824  Dressing Status Clean;Dry;Intact 12/17/19 1824  Dressing Intervention New dressing 12/17/19 [id number]  Dressing Change Due 12/24/19 12/17/19 1824       Winfred Redel, Nicolette Bang 12/17/2019, 6:25 PM

## 2019-12-17 NOTE — Progress Notes (Signed)
Pt called RN to room to report coughing and vomiting. Pt emesis mucus and clear. telemetry called to report pt having 4 beats run of V-tach whiles RN in room. Pt asymptomatic and denies any discomfort. MD notified and new orders received as pt noted to have difficulty swallowing her ordered potassium po pill. Pt vomited medication out upon swallowing half pill and MD was notified. Will continue to closely monitor pt. Delia Heady RN

## 2019-12-17 NOTE — Progress Notes (Addendum)
Progress Note  Patient Name: Melissa Hurley Date of Encounter: 12/17/2019  Primary Cardiologist: Will Meredith Leeds, MD  Subjective   Denies any acute complaints, just tired. No CP, SOB, palpitations.  Inpatient Medications    Scheduled Meds: . Chlorhexidine Gluconate Cloth  6 each Topical Daily  . feeding supplement  1 Container Oral TID BM  . feeding supplement (PRO-STAT SUGAR FREE 64)  30 mL Oral BID  . metoprolol tartrate  12.5 mg Oral BID  . potassium chloride  40 mEq Oral Once  . sodium chloride flush  10-40 mL Intracatheter Q12H  . sodium chloride flush  3 mL Intravenous Once  . sodium chloride flush  3 mL Intravenous Q12H   Continuous Infusions: . vancomycin Stopped (12/17/19 1358)   PRN Meds: acetaminophen **OR** acetaminophen, Gerhardt's butt cream, loperamide, LORazepam, metoprolol tartrate, ondansetron (ZOFRAN) IV, sodium chloride flush   Vital Signs    Vitals:   12/17/19 1048 12/17/19 1053 12/17/19 1130 12/17/19 1527  BP:  119/85 (!) 119/52 127/88  Pulse: 92 92 89 94  Resp: 18 20 18 16   Temp: 98.3 F (36.8 C) 98.3 F (36.8 C) 98.5 F (36.9 C) 98.2 F (36.8 C)  TempSrc:   Oral Oral  SpO2: 98% 97% 99% 100%  Weight:      Height:        Intake/Output Summary (Last 24 hours) at 12/17/2019 1625 Last data filed at 12/17/2019 1400 Gross per 24 hour  Intake 150 ml  Output -  Net 150 ml   Last 3 Weights 12/10/2019 12/10/2019 10/25/2019  Weight (lbs) 198 lb 13.7 oz 205 lb 210 lb  Weight (kg) 90.2 kg 92.987 kg 95.255 kg     Telemetry    NSR with brief runs of SVT and NSVT (latter was 4 and 10beats) - Personally Reviewed  Physical Exam   GEN: No acute distress, chronically ill/fatigued appearing HEENT: Normocephalic, atraumatic, sclera non-icteric. Neck: No JVD or bruits. Cardiac: RRR no murmurs, rubs, or gallops.  Radials/DP/PT 1+ and equal bilaterally.  Respiratory: Clear to auscultation bilaterally. Breathing is unlabored. GI: Soft, nontender,  non-distended, BS +x 4. MS: no deformity. Extremities: No clubbing or cyanosis. Trace-1+ puffy bilateral LE edema ,L>R. Distal pedal pulses are 2+ and equal bilaterally. Neuro:  AAOx3. Follows commands. Psych:  Responds to questions appropriately with a normal affect.  Labs    High Sensitivity Troponin:  No results for input(s): TROPONINIHS in the last 720 hours.    Cardiac EnzymesNo results for input(s): TROPONINI in the last 168 hours. No results for input(s): TROPIPOC in the last 168 hours.   Chemistry Recent Labs  Lab 12/11/19 1302 12/12/19 0739 12/15/19 0439 12/16/19 0543 12/17/19 0443  NA 136   < > 140 138 136  K 3.2*   < > 3.7 4.1 3.7  CL 106   < > 110 107 107  CO2 21*   < > 15* 20* 20*  GLUCOSE 143*   < > 89 90 92  BUN 21*   < > 21* 22* 23*  CREATININE 1.56*   < > 1.25* 1.23* 1.17*  CALCIUM 5.7*   < > 6.8* 6.5* 6.5*  PROT 4.9*  --   --   --  5.4*  ALBUMIN 2.2*  --   --   --  1.9*  AST 26  --   --   --  25  ALT 15  --   --   --  15  ALKPHOS 91  --   --   --  180*  BILITOT 1.1  --   --   --  1.0  GFRNONAA 38*   < > 49* 50* 54*  GFRAA 44*   < > 57* 58* >60  ANIONGAP 9   < > 15 11 9    < > = values in this interval not displayed.     Hematology Recent Labs  Lab 12/15/19 0439 12/16/19 0543 12/17/19 0443  WBC 5.2 6.4 9.0  RBC 3.60* 3.28* 3.59*  HGB 9.5* 8.6* 9.4*  HCT 29.3* 27.3* 29.8*  MCV 81.4 83.2 83.0  MCH 26.4 26.2 26.2  MCHC 32.4 31.5 31.5  RDW 19.5* 19.9* 20.3*  PLT 125* 163 183    BNPNo results for input(s): BNP, PROBNP in the last 168 hours.   DDimer No results for input(s): DDIMER in the last 168 hours.   Radiology    DG CHEST PORT 1 VIEW  Result Date: 12/17/2019 CLINICAL DATA:  Cough EXAM: PORTABLE CHEST 1 VIEW COMPARISON:  12/10/2019 FINDINGS: The heart size and mediastinal contours are within normal limits. Unchanged elevation of the right hemidiaphragm with associated atelectasis or consolidation. The visualized skeletal structures are  unremarkable. IMPRESSION: Unchanged elevation of the right hemidiaphragm with associated atelectasis or consolidation. No acute abnormality of the lungs in AP portable projection. Electronically Signed   By: Eddie Candle M.D.   On: 12/17/2019 13:35    Cardiac Studies   2D Echo 12/13/19 1. Left ventricular ejection fraction, by estimation, is 55 to 60%. The  left ventricle has normal function. The left ventricle has no regional  wall motion abnormalities. There is mild left ventricular hypertrophy.  Indeterminate diastolic filling due to  E-A fusion.  2. Right ventricular systolic function is normal. The right ventricular  size is normal. Tricuspid regurgitation signal is inadequate for assessing  PA pressure.  3. Left atrial size was mildly dilated.  4. Moderate to large pericardial effusion. The pericardial effusion is  anterior to the right ventricle and surrounding the apex. There is no  evidence of cardiac tamponade. No RV diastolic collapse, IVC appears small  and collapsible. Respirometer  evaluation not performed.  5. The mitral valve is normal in structure. No evidence of mitral valve  regurgitation. No evidence of mitral stenosis.  6. The aortic valve is tricuspid. Aortic valve regurgitation is mild. No  aortic stenosis is present.  7. The inferior vena cava is normal in size with greater than 50%  respiratory variability, suggesting right atrial pressure of 3 mmHg.   Patient Profile     54 y.o. female with history of metastatic intrahepatic cholangiocarcinoma followed at [facility] and currently undergoing tx, previous  (suspected malignant) pericardial effusion, CKD stage 2, recent CVA on Eliquis who presented to the hospital with fever, nausea, vomiting, poor PO intake found to have hypokalemia, hypomagnesemia, hypophosphatemia and pancytopenia, febrile neutropenia, sepsis, cornybacteremia, AKI. And CXR showed mild cardiomegaly. ID was consulted and port was removed due to  bacteremia. To go home with IV Abx. Given that cornybacteremia can be associated with native endocarditis, ID recommends f/u blood cultures in 10-14 days.   Assessment & Plan    1. Recurrent Pericardial Effusion  - suspected secondary to metastatic biliary cancer. Not biopsy proven but clinically suspicious. Size has fluctuated in the past. Dr. Claiborne Billings personally reviewed Echo and felt more consistent with moderate effusion. No evidence of tamponade on Echo - no RV collapse and normal respiratory variability. No need for pericardiocentesis at this time. Per Dr. Claiborne Billings, given high-likelihood of malignant  effusion, pericardial window/biopsy could be considered if fevers continue despite removal of portacath - or if she develops signs of tamponade. She has remained afebrile over 48 hours. Eliquis on hold due to thrombocytopenia and potential for conversion to hemorrhagic effusion. Platelet count has recovered - Will discuss whether this is a permanent recommendation with MD. Clinically no signs of tamponade.  2. PSVT and NSVT - will titrate Lopressor to 25mg  BID now that BP is OK. Will review with Dr. Claiborne Billings. Keep K 4.0 or greater, Mg 2.0 or greater. Got Mag sulfate today. Vomited half of her potassium pill. D/w Dr. Erlinda Hong - plan to f/u labs in AM to reassess. Add TSH to AM labs.  3. Multiple medical issues including SIRS/Bacteremia s/p port-a-cath removal, Stage IV metastatic intrahepatic cholangiocarcinoma s/p chemo, pancytopenia s/p 2 units RBCs and 1 platelet transfusion, AKI on CKD stage II and recent CVA. Being comanaged by IM, oncology, ID. Pt declined palliative care in the hospital.  4. Edema - pt reports this has been going on for months. Echo with normal LVF. Suspect related to protein/fluid shifts from severe hypoalbuminemia. LE venous duplex pending by IM.  For questions or updates, please contact [place] Please consult [email or web address] for contact info under Cardiology/STEMI.  Signed, Charlie Pitter, PA-C 12/17/2019, 4:25 PM    Patient seen and examined. Agree with assessment and plan. No shortness of breath; BP stable; improved today at 125/84 presently. Sinus rhythm now at 85 without previous tachycardia. No rub. No tamponade findings, no pulsus paradox on several BP recordings.    Troy Sine, MD, [facility] 12/17/2019 5:50 PM

## 2019-12-17 NOTE — Progress Notes (Signed)
Notification per telemetry that patient had a 10 beat run of Vtach nonsustained; stripped saved in chart.

## 2019-12-17 NOTE — TOC Progression Note (Signed)
Transition of Care [facility]) - Progression Note    Patient Details  Name: CATHIE LANSANG MRN: [id number] Date of Birth: Apr 12, 1966  Transition of [facility]) CM/SW Contact  Pollie Friar, RN Phone Number: 12/17/2019, 2:50 PM  Clinical Narrative:    Awaiting BC results for PICC line. Pt will d/c home with home IV abx and IVF. [employer] has the information for the IVF and abx and will deliver the meds to the home.  Alvis Lemmings is aware of weekend d/c and will see her on Monday. Pt and spouse have been educated on IV administration of the abx and RN will educate the family at home on Monday for the IVF. IVF wont start until Tuesday. No pump required for the IVF. They will use tubing that can be dialed in for the rate.  Pt has transport home when medically ready.    Expected Discharge Plan: [place] Barriers to Discharge: Continued Medical Work up  Expected Discharge Plan and Services Expected Discharge Plan: [place]   Discharge Planning Services: CM Consult Post Acute Care Choice: [place] arrangements for the past 2 months: Single Family Home                           HH Arranged: RN, PT [facility] Agency: [place] Date [place]: 12/16/19   Representative spoke with at [place]: [place]) Interventions    Readmission Risk Interventions No flowsheet data found.

## 2019-12-17 NOTE — Progress Notes (Signed)
PROGRESS NOTE  Melissa Hurley YVO:[id number] DOB: 1965-12-11 DOA: 12/10/2019 PCP: Patient, No Pcp Per  Brief hospital course: Past medical history of, metastatic intrahepatic cholangiocarcinoma, followed at [facility] and currently undergoing tx cisplatin/gemcitabine, who was recently hospitalized in 10/2019 due to acute left MCA CVA and started on eliquis presents withnausea, vomiting, poor p.o. intake.  Found to have hypokalemia, hypomagnesemia, hypophosphatemia as well as pancytopenia with undetectable platelets.     HPI/Recap of past 24 hours:  Reports she is spitting  all night No fever, blood pressure is improving Husband at bedside  Assessment/Plan: Principal Problem:   Pancytopenia (HCC) Active Problems:   Nausea & vomiting   Dehydration   AKI (acute kidney injury) ([place])   SIRS (systemic inflammatory response syndrome) (HCC)   Metastatic cholangiocarcinoma to bone (HCC)   History of CVA (cerebrovascular accident)   Hypokalemia   Hypocalcemia   Bacteremia   Pericardial effusion  Bacteremia/febrile neutropenia/sepsis on presentation with fever 102.7, tachycardia, tachypnea, lactic acidosis Improving port removed on April 28, repeat blood culture on 4/29, plan to place PICC line once repeat blood culture is clean She is continued on IV vancomycin, plan for 2 weeks of therapy from date of port removal per infectious disease recommendation (End Date: 12/29/2019 ), leave PIC in place until doctor has seen patient or been notified Infections disease input appreciated, will follow recommendation has an appointment on 12/18/2019 with Dr. Tommy Medal at 1045 AM   Moderate to large pericardial effusion. The pericardial effusion is anterior to the right ventricle and surrounding the apex.  There is no evidence of cardiac tamponade.  Cardiology consulted, input appreciated  Cardiology recommend "serial echoes in future and if there is significant increase consider  pericardial window with pericardial biopsy."  Nonsustained SVT Keep K above 4 and mag above 2 Start Lopressor with holding parameters Keep on telemetry Cardiology following  Lower extremity edema, left more than right Will get venous US   Stage IV metastatic intrahepatic cholangiocarcinoma , present with pancytopenia, post chemo Present generalized fatigue found to have hemoglobin 6.5, platelets undetectable, neutropenia. Patient has received 2 PRBC and 1 platelet transfusion. No active bleeding. WBC and platelet improving, hemoglobin stable Patient desired to switch oncology care back to cancer center here,  Dr. Lorenso Courier consulted.  Report nausea and vomiting at home None in the hospital  Report poor oral intake at home due to loss of appetite, denies of dysphagia She was observed choking on pills, aspiration precaution She is getting stronger, she did well with speech eval Continue aspiration precaution  Diarrhea Report associated with IV antibiotics C. difficile negative, GI PCR panel negative Supportive care  Hypokalemia, replaced  , improved Hypomagnesemia, replace mag to keep mag above 2 Hypophosphatemia, replaced ,normalized Hypocalcemia, s/p calcium gluconate  AKI on CKD 2 BUN 21 creatinine 1.56 on presentation BUN 22 creatinine 1.17today  renal dosing meds  Recent left MCA CVA with dysarthria Started on Eliquis. In the setting of thrombocytopenia currently holding all medications. Platelet improved, resume eliquis after picc line placement   FTT, Poor prognosis, it appeared that her oncologist set up palliative care visit on 4/15, she currently declined palliative care while she is in the hospital, recommend outpatient palliative care follow up  DVT Prophylaxis: SCDs  Code Status: Full  Family Communication: husband on 4/29 and 4/30  Disposition Plan:    Patient came from:  home                                                                                  Anticipated d/c place: home with  home health services   Barriers to d/c OR conditions which need to be met to effect a safe d/c:  needs picc line placement, needs oncology clearance, likely able to go home tomorrow   Consultants:  ID  IR  Hematology oncology  Procedures:  remove Port-A-Cath  Antibiotics:  IV Vanco   Objective: BP (!) 119/52 (BP Location: Left Arm)   Pulse 89   Temp 98.5 F (36.9 C) (Oral)   Resp 18   Ht 5' 6"  (1.676 m)   Wt 90.2 kg   LMP 10/25/2014   SpO2 99%   BMI 32.10 kg/m  No intake or output data in the 24 hours ending 12/17/19 1316 Filed Weights   12/10/19 1430 12/10/19 2222  Weight: 93 kg 90.2 kg    Exam: Patient is examined daily including today on 12/17/2019, exams remain the same as of yesterday except that has changed    General:  Weak,  NAD  Cardiovascular: RRR  Respiratory: CTABL  Abdomen: Soft/ND/NT, positive BS  Musculoskeletal: trace bilateral pedal edema, left more than rihgt  Neuro: alert, oriented x3, slowed speech  Data Reviewed: Basic Metabolic Panel: Recent Labs  Lab 12/11/19 1302 12/11/19 1302 12/12/19 0739 12/12/19 1555 12/13/19 0322 12/13/19 0322 12/13/19 1555 12/14/19 0331 12/15/19 0439 12/16/19 0543 12/17/19 0443  NA 136   < > 137   < > 137   < > 137 139 140 138 136  K 3.2*   < > 3.1*   < > 3.5   < > 3.0* 3.4* 3.7 4.1 3.7  CL 106   < > 109   < > 109   < > 106 109 110 107 107  CO2 21*   < > 17*   < > 20*   < > 18* 22 15* 20* 20*  GLUCOSE 143*   < > 90   < > 95   < > 123* 89 89 90 92  BUN 21*   < > 19   < > 21*   < > 23* 21* 21* 22* 23*  CREATININE 1.56*   < > 1.29*   < > 1.31*   < > 1.52* 1.17* 1.25* 1.23* 1.17*  CALCIUM 5.7*   < > 5.6*   < > 6.0*   < > 6.6* 6.5* 6.8* 6.5* 6.5*  MG 1.2*   < > 1.6*   < > 2.0   < > 1.9 1.8 1.7 1.9 1.7  PHOS <1.0*  --  2.0*  --  2.4*  --   --  2.8 3.0  --   --    < > = values in this interval not displayed.   Liver Function  Tests: Recent Labs  Lab 12/10/19 1422 12/11/19 1302 12/17/19 0443  AST 22 26 25   ALT 16 15 15   ALKPHOS 88 91 180*  BILITOT 0.7 1.1 1.0  PROT 5.5* 4.9* 5.4*  ALBUMIN 2.4* 2.2* 1.9*   Recent Labs  Lab 12/10/19 1422  LIPASE 19   No results for input(s): AMMONIA in the last 168 hours. CBC: Recent Labs  Lab 12/13/19 0322 12/14/19 0331 12/15/19 0439 12/16/19 0543 12/17/19 0443  WBC 1.8* 2.7* 5.2 6.4 9.0  NEUTROABS 0.2* 0.3* 1.1* 2.2 7.4  HGB 8.9* 8.8* 9.5* 8.6* 9.4*  HCT 28.1* 27.4* 29.3* 27.3* 29.8*  MCV 82.4 80.8 81.4 83.2 83.0  PLT 50* 71* 125* 163 183   Cardiac Enzymes:   No results for input(s): CKTOTAL, CKMB, CKMBINDEX, TROPONINI in the last 168 hours. BNP (last 3 results) No results for input(s): BNP in the last 8760 hours.  ProBNP (last 3 results) No results for input(s): PROBNP in the last 8760 hours.  CBG: Recent Labs  Lab 12/16/19 0637 12/16/19 2218 12/17/19 0626  GLUCAP 84 87 79    Recent Results (from the past 240 hour(s))  Blood culture (routine x 2)     Status: Abnormal   Collection Time: 12/10/19  3:28 PM   Specimen: BLOOD  Result Value Ref Range Status   Specimen Description BLOOD SITE NOT SPECIFIED  Final   Special Requests   Final    BOTTLES DRAWN AEROBIC AND ANAEROBIC Blood Culture adequate volume   Culture  Setup Time   Final    GRAM POSITIVE RODS AEROBIC BOTTLE ONLY CRITICAL RESULT CALLED TO, READ BACK BY AND VERIFIED WITH: L. CURRAN,PHARMD 2127 12/11/2019 T. TYSOR    Culture (A)  Final    DIPHTHEROIDS(CORYNEBACTERIUM SPECIES) Standardized susceptibility testing for this organism is not available. Performed at [facility], [place] [address], [place], [place] [address]    Report Status 12/12/2019 FINAL  Final  Respiratory Panel by RT PCR (Flu A&B, Covid) - Nasopharyngeal Swab     Status: None   Collection Time: 12/10/19  6:03 PM   Specimen: Nasopharyngeal Swab  Result Value Ref Range Status   SARS Coronavirus 2 by RT PCR  NEGATIVE NEGATIVE Final    Comment: (NOTE) SARS-CoV-2 target nucleic acids are NOT DETECTED. The SARS-CoV-2 RNA is generally detectable in upper respiratoy specimens during the acute phase of infection. The lowest concentration of SARS-CoV-2 viral copies this assay can detect is 131 copies/mL. A negative result does not preclude SARS-Cov-2 infection and should not be used as the sole basis for treatment or other patient management decisions. A negative result may occur with  improper specimen collection/handling, submission of specimen other than nasopharyngeal swab, presence of viral mutation(s) within the areas targeted by this assay, and inadequate number of viral copies (<131 copies/mL). A negative result must be combined with clinical observations, patient history, and epidemiological information. The expected result is Negative. Fact Sheet for Patients:  PinkCheek.be Fact Sheet for Healthcare Providers:  GravelBags.it This test is not yet ap proved or cleared by the [place] FDA and  has been authorized for detection and/or diagnosis of SARS-CoV-2 by FDA under an Emergency Use Authorization (EUA). This EUA will remain  in effect (meaning this test can be used) for the duration of the COVID-19 declaration under Section 564(b)(1) of the Act, 21 U.S.C. section 360bbb-3(b)(1), unless the authorization is terminated or revoked sooner.    Influenza A by PCR NEGATIVE NEGATIVE Final   Influenza B by PCR NEGATIVE NEGATIVE Final    Comment: (NOTE) The Xpert Xpress SARS-CoV-2/FLU/RSV assay is intended as an aid in  the diagnosis of influenza from Nasopharyngeal swab specimens and  should not be used as a sole basis for treatment. Nasal washings and  aspirates are unacceptable for  Xpert Xpress SARS-CoV-2/FLU/RSV  testing. Fact Sheet for Patients: PinkCheek.be Fact Sheet for Healthcare  Providers: GravelBags.it This test is not yet approved or cleared by the [place] FDA and  has been authorized for detection and/or diagnosis of SARS-CoV-2 by  FDA under an Emergency Use Authorization (EUA). This EUA will remain  in effect (meaning this test can be used) for the duration of the  Covid-19 declaration under Section 564(b)(1) of the Act, 21  U.S.C. section 360bbb-3(b)(1), unless the authorization is  terminated or revoked. Performed at [facility], [place] [address], [place], [place] [address]   Blood culture (routine x 2)     Status: Abnormal   Collection Time: 12/10/19  8:47 PM   Specimen: BLOOD  Result Value Ref Range Status   Specimen Description BLOOD SITE NOT SPECIFIED  Final   Special Requests   Final    BOTTLES DRAWN AEROBIC AND ANAEROBIC Blood Culture results may not be optimal due to an inadequate volume of blood received in culture bottles   Culture  Setup Time   Final    GRAM POSITIVE RODS AEROBIC BOTTLE ONLY CRITICAL RESULT CALLED TO, READ BACK BY AND VERIFIED WITH: L. CURRAN,PHARMD 2127 12/11/2019 T. TYSOR    Culture (A)  Final    CORYNEBACTERIUM, GROUP JK Standardized susceptibility testing for this organism is not available. Performed at [facility], [place] [address], [place], [place] [address]    Report Status 12/12/2019 FINAL  Final  C Difficile Quick Screen w PCR reflex     Status: None   Collection Time: 12/11/19 10:12 AM   Specimen: STOOL  Result Value Ref Range Status   C Diff antigen NEGATIVE NEGATIVE Final   C Diff toxin NEGATIVE NEGATIVE Final   C Diff interpretation No C. difficile detected.  Final    Comment: Performed at [facility], [place] [address], [place], [place] [address]  Gastrointestinal Panel by PCR , Stool     Status: None   Collection Time: 12/11/19 10:12 AM   Specimen: Stool  Result Value Ref Range Status   Campylobacter species NOT DETECTED NOT DETECTED Final   Plesimonas  shigelloides NOT DETECTED NOT DETECTED Final   Salmonella species NOT DETECTED NOT DETECTED Final   Yersinia enterocolitica NOT DETECTED NOT DETECTED Final   Vibrio species NOT DETECTED NOT DETECTED Final   Vibrio cholerae NOT DETECTED NOT DETECTED Final   Enteroaggregative E coli (EAEC) NOT DETECTED NOT DETECTED Final   Enteropathogenic E coli (EPEC) NOT DETECTED NOT DETECTED Final   Enterotoxigenic E coli (ETEC) NOT DETECTED NOT DETECTED Final   Shiga like toxin producing E coli (STEC) NOT DETECTED NOT DETECTED Final   Shigella/Enteroinvasive E coli (EIEC) NOT DETECTED NOT DETECTED Final   Cryptosporidium NOT DETECTED NOT DETECTED Final   Cyclospora cayetanensis NOT DETECTED NOT DETECTED Final   Entamoeba histolytica NOT DETECTED NOT DETECTED Final   Giardia lamblia NOT DETECTED NOT DETECTED Final   Adenovirus F40/41 NOT DETECTED NOT DETECTED Final   Astrovirus NOT DETECTED NOT DETECTED Final   Norovirus GI/GII NOT DETECTED NOT DETECTED Final   Rotavirus A NOT DETECTED NOT DETECTED Final   Sapovirus (I, II, IV, and V) NOT DETECTED NOT DETECTED Final    Comment: Performed at [facility], [place] [place], [place] [address]  Culture, blood (x 2)     Status: Abnormal   Collection Time: 12/12/19  7:39 AM   Specimen: BLOOD  Result Value Ref Range  Status   Specimen Description BLOOD SITE NOT SPECIFIED  Final   Special Requests   Final    BOTTLES DRAWN AEROBIC ONLY Blood Culture results may not be optimal due to an inadequate volume of blood received in culture bottles   Culture  Setup Time   Final    AEROBIC BOTTLE ONLY GRAM POSITIVE RODS CRITICAL VALUE NOTED.  VALUE IS CONSISTENT WITH PREVIOUSLY REPORTED AND CALLED VALUE.    Culture (A)  Final    CORYNEBACTERIUM, GROUP JK Standardized susceptibility testing for this organism is not available. Performed at [facility], [place] [address] [place], [place] [address]    Report Status 12/15/2019 FINAL  Final   Culture, blood (x 2)     Status: Abnormal   Collection Time: 12/12/19  7:53 AM   Specimen: BLOOD  Result Value Ref Range Status   Specimen Description BLOOD SITE NOT SPECIFIED  Final   Special Requests   Final    BOTTLES DRAWN AEROBIC ONLY Blood Culture results may not be optimal due to an inadequate volume of blood received in culture bottles   Culture  Setup Time   Final    AEROBIC BOTTLE ONLY GRAM POSITIVE RODS CRITICAL VALUE NOTED.  VALUE IS CONSISTENT WITH PREVIOUSLY REPORTED AND CALLED VALUE.    Culture (A)  Final    CORYNEBACTERIUM, GROUP JK Standardized susceptibility testing for this organism is not available. Performed at [facility], [place] [address] [place], [place] [address]    Report Status 12/15/2019 FINAL  Final  Cath Tip Culture     Status: None (Preliminary result)   Collection Time: 12/15/19  2:42 PM   Specimen: Catheter Tip; Other  Result Value Ref Range Status   Specimen Description CATH TIP PORTA CATH  Final   Special Requests NONE  Final   Culture   Final    NO GROWTH 2 DAYS Performed at [facility], [address], [place], [place] [address]    Report Status PENDING  Incomplete     Studies: No results found.  Scheduled Meds: . Chlorhexidine Gluconate Cloth  6 each Topical Daily  . feeding supplement  1 Container Oral TID BM  . feeding supplement (PRO-STAT SUGAR FREE 64)  30 mL Oral BID  . metoprolol tartrate  12.5 mg Oral BID  . potassium chloride  40 mEq Oral Once  . sodium chloride flush  10-40 mL Intracatheter Q12H  . sodium chloride flush  3 mL Intravenous Once  . sodium chloride flush  3 mL Intravenous Q12H    Continuous Infusions: . magnesium sulfate bolus IVPB    . vancomycin 750 mg (12/17/19 1252)     Time spent: 54mns, I have personally reviewed and interpreted on  12/17/2019 daily labs, tele strips, imagings as discussed above under date review session and assessment and plans.  I reviewed all nursing notes,  pharmacy notes, consultant notes,  vitals, pertinent old records  I have discussed plan of care as described above with RN , patient  And husband on 12/17/2019   FFlorencia ReasonsMD, PhD, FACP  [facility]  Available via Epic secure chat 7am-7pm for nonurgent issues Please page for urgent issues, pager number available through [employer] .   12/17/2019, 1:16 PM  LOS: 7 days

## 2019-12-18 ENCOUNTER — Inpatient Hospital Stay (HOSPITAL_COMMUNITY): Payer: BC Managed Care – PPO

## 2019-12-18 DIAGNOSIS — R609 Edema, unspecified: Secondary | ICD-10-CM

## 2019-12-18 DIAGNOSIS — D61818 Other pancytopenia: Secondary | ICD-10-CM

## 2019-12-18 DIAGNOSIS — E440 Moderate protein-calorie malnutrition: Secondary | ICD-10-CM

## 2019-12-18 LAB — CBC WITH DIFFERENTIAL/PLATELET
Abs Immature Granulocytes: 0.47 10*3/uL — ABNORMAL HIGH (ref 0.00–0.07)
Basophils Absolute: 0 10*3/uL (ref 0.0–0.1)
Basophils Relative: 0 %
Eosinophils Absolute: 0 10*3/uL (ref 0.0–0.5)
Eosinophils Relative: 0 %
HCT: 28.3 % — ABNORMAL LOW (ref 36.0–46.0)
Hemoglobin: 9 g/dL — ABNORMAL LOW (ref 12.0–15.0)
Immature Granulocytes: 4 %
Lymphocytes Relative: 17 %
Lymphs Abs: 2 10*3/uL (ref 0.7–4.0)
MCH: 26.4 pg (ref 26.0–34.0)
MCHC: 31.8 g/dL (ref 30.0–36.0)
MCV: 83 fL (ref 80.0–100.0)
Monocytes Absolute: 2.6 10*3/uL — ABNORMAL HIGH (ref 0.1–1.0)
Monocytes Relative: 22 %
Neutro Abs: 6.9 10*3/uL (ref 1.7–7.7)
Neutrophils Relative %: 57 %
Platelets: 202 10*3/uL (ref 150–400)
RBC: 3.41 MIL/uL — ABNORMAL LOW (ref 3.87–5.11)
RDW: 21 % — ABNORMAL HIGH (ref 11.5–15.5)
WBC: 11.9 10*3/uL — ABNORMAL HIGH (ref 4.0–10.5)
nRBC: 0.2 % (ref 0.0–0.2)

## 2019-12-18 LAB — GLUCOSE, CAPILLARY: Glucose-Capillary: 83 mg/dL (ref 70–99)

## 2019-12-18 LAB — CATH TIP CULTURE: Culture: NO GROWTH

## 2019-12-18 LAB — BASIC METABOLIC PANEL
Anion gap: 8 (ref 5–15)
BUN: 24 mg/dL — ABNORMAL HIGH (ref 6–20)
CO2: 20 mmol/L — ABNORMAL LOW (ref 22–32)
Calcium: 6.5 mg/dL — ABNORMAL LOW (ref 8.9–10.3)
Chloride: 109 mmol/L (ref 98–111)
Creatinine, Ser: 1.41 mg/dL — ABNORMAL HIGH (ref 0.44–1.00)
GFR calc Af Amer: 50 mL/min — ABNORMAL LOW (ref >60)
GFR calc non Af Amer: 43 mL/min — ABNORMAL LOW (ref >60)
Glucose, Bld: 88 mg/dL (ref 70–99)
Potassium: 3.5 mmol/L (ref 3.5–5.1)
Sodium: 137 mmol/L (ref 135–145)

## 2019-12-18 LAB — MAGNESIUM: Magnesium: 2.2 mg/dL (ref 1.7–2.4)

## 2019-12-18 LAB — TSH: TSH: 2.61 u[IU]/mL (ref 0.350–4.500)

## 2019-12-18 LAB — VANCOMYCIN, TROUGH: Vancomycin Tr: 24 ug/mL (ref 15–20)

## 2019-12-18 MED ORDER — VANCOMYCIN IV (FOR PTA / DISCHARGE USE ONLY)
750.0000 mg | INTRAVENOUS | 0 refills | Status: AC
Start: 2019-12-18 — End: 2019-12-29

## 2019-12-18 MED ORDER — METOPROLOL TARTRATE 25 MG PO TABS: 25.0000 mg | ORAL_TABLET | Freq: Two times a day (BID) | ORAL | 0 refills | Status: AC

## 2019-12-18 MED ORDER — LOPERAMIDE HCL 2 MG PO CAPS: 2.0000 mg | ORAL_CAPSULE | Freq: Four times a day (QID) | ORAL | 0 refills | Status: AC | PRN

## 2019-12-18 MED ORDER — PRO-STAT SUGAR FREE PO LIQD: 30.0000 mL | Freq: Two times a day (BID) | ORAL | 0 refills | Status: DC

## 2019-12-18 MED ORDER — HEPARIN SOD (PORK) LOCK FLUSH 100 UNIT/ML IV SOLN
250.0000 [IU] | INTRAVENOUS | Status: AC | PRN
  Administered 2019-12-18: 15:00:00 250 [IU]
  Filled 2019-12-18: qty 2.5

## 2019-12-18 NOTE — Discharge Summary (Addendum)
Discharge Summary  Melissa Hurley ZOX:[id number] DOB: 02-22-1966  PCP: Patient, No Pcp Per  Admit date: 12/10/2019 Discharge date: 12/18/2019  Time spent: , more than 50% time spent on coordination of care.  Recommendations for Outpatient Follow-up:  1. F/u with hematology oncology within a week  for hospital discharge follow up, repeat cbc/bmp at follow up 2. Follow-up with cardiology for pericardial effusion 3. Follow-up with infectious disease 4. Follow-up with Home health  Discharge Diagnoses:  Active Hospital Problems   Diagnosis Date Noted  . Pancytopenia (HCC) 12/10/2019  . Malnutrition of moderate degree 12/18/2019  . Bacteremia   . Pericardial effusion   . Nausea & vomiting 12/10/2019  . Dehydration 12/10/2019  . AKI (acute kidney injury) (HCC) 12/10/2019  . SIRS (systemic inflammatory response syndrome) (HCC) 12/10/2019  . Metastatic cholangiocarcinoma to bone (HCC) 12/10/2019  . History of CVA (cerebrovascular accident) 12/10/2019  . Hypokalemia 12/10/2019  . Hypocalcemia 12/10/2019    Resolved Hospital Problems  No resolved problems to display.    Discharge Condition: stable  Diet recommendation: Regular diet  Filed Weights   12/10/19 1430 12/10/19 2222  Weight: 93 kg 90.2 kg    History of present illness:( Per admitting MD Dr. Alvino Chapel) PCP: Patient, No Pcp Per  Patient coming from: Home  Chief Complaint: N/V, poor PO intake   HPI: Melissa Hurley is a 54 y.o. female with medical history significant of metastatic intrahepatic cholangiocarcinoma, followed at [facility] and currently undergoing tx cisplatin/gemcitabine, who was recently hospitalized in 10/2019 due to acute left MCA CVA and started on eliquis presents with nausea, vomiting, poor p.o. intake.  Her husband is at bedside who helps with history.  They state that she has had poor intake for several months since she started chemotherapy.  She is not able to eat much, has had  some nausea as well as vomiting.  Her last chemotherapy treatment was about 2 weeks ago.  Over the past couple of days, she has noted some epistaxis which has now resolved.  She has also noticed a large bruise on her right forearm.  She presents to the hospital at the recommendation of her oncologist to be evaluated for dehydration.  She denies any fevers or chills, productive cough or any abdominal pain.  She has intermittent diarrhea, which she has had after chemotherapy treatments.  Currently, she does not notice any bleeding.  Her last Eliquis dose was 4/23 a.m.  ED Course: Labs reveal WBC 1.4, hemoglobin 6.5, platelet <5, hypokalemia with potassium 3.1, hypocalcemia 6.0, AKI with creatinine 1.49.  Case was discussed by EDP with oncology on-call who recommended blood product transfusions and supportive care as well as electrolyte replacement.  Covid testing was ordered and pending at time of admission.  Hospital Course:  Principal Problem:   Pancytopenia (HCC) Active Problems:   Nausea & vomiting   Dehydration   AKI (acute kidney injury) (HCC)   SIRS (systemic inflammatory response syndrome) (HCC)   Metastatic cholangiocarcinoma to bone [facility])   History of CVA (cerebrovascular accident)   Hypokalemia   Hypocalcemia   Bacteremia   Pericardial effusion   Malnutrition of moderate degree   Bacteremia/febrile neutropenia/sepsis on presentation with fever 102.7, tachycardia, tachypnea, lactic acidosis port removed on April 28, repeat blood culture on 4/29 no growth,  PICC line placed on April 30 . She is continued on IV vancomycin, plan for 2 weeks of therapy from date of port removal per infectious disease recommendation (End Date: 12/29/2019 ),  leave PICC in place until doctor has seen patient or been notified has an appointment on Jan 30, 2020 with Dr. Daiva Eves at 1045 AM   Moderate to large pericardial effusion. The pericardial effusion is anterior to the right ventricle and surrounding the  apex.  There is no evidence of cardiac tamponade.  Cardiology consulted, input appreciated Cardiology recommend "serial echoes in futureand if there is significant increase consider pericardial window with pericardial biopsy." Follow-up with cardiology  Nonsustained SVT Keep K above 4 and mag above 2 Started on  Lopressor with holding parameters Follow-up with cardiology   Lower extremity edema, left more than right venous US negative for DVT   Stage IV metastatic intrahepatic cholangiocarcinoma , present with pancytopenia, post chemo Present generalized fatigue found to have hemoglobin 6.5, platelets undetectable, neutropenia. Patient has received 2 PRBC and 1 platelet transfusion. No active bleeding. WBC and platelet normalized, hemoglobin stable Patient desired to switch oncology care back to cancer center here,  Dr. Leonides Schanz consulted. Follow-up with Dr. Leonides Schanz  Report nausea and vomiting at home None in the hospital  Report poor oral intake at home due to loss of appetite, denies of dysphagia She was observed choking on pills, aspiration precaution She is getting stronger, she did well with speech eval Continue aspiration precaution  Diarrhea Report associated with IV antibiotics C. difficile negative, GI PCR panel negative Supportive care  Hypokalemia, replaced  , improved Hypomagnesemia, replace mag to keep mag above 2 Hypophosphatemia, replaced ,normalized Hypocalcemia, s/p calcium gluconate  AKI on CKD 2 BUN 21 creatinine 1.56 on presentation, improved  renal dosing meds  Recent left MCA CVA with dysarthria on Eliquis at home , ldl 55 Eliquis held in the setting of thrombocytopenia  Platelet normalized, resume eliquis after picc line placement  Non-severe (moderate) malnutrition in context of chronic illness Nutrition input appreciated  FTT, Poor prognosis, it appeared that her oncologist set up palliative care visit on 4/15, she currently  declined palliative care while she is in the hospital, recommend outpatient palliative care follow up  DVT Prophylaxis: SCDs  Code Status: Full  Family Communication: husband on 4/29 and 4/30  Disposition Plan:    Patient came from:home  Anticipated d/c place:home with  home health services     Consultants:  ID  IR  Hematology oncology  Cardiology  Procedures:  remove Port-A-Cath  PICC line placement April 30  Antibiotics:  IV Vanco   Discharge Exam: BP 112/82 (BP Location: Right Arm)   Pulse 91   Temp 98.7 F (37.1 C) (Oral)   Resp 18   Ht 5\' 6"  (1.676 m)   Wt 90.2 kg   LMP 10/25/2014   SpO2 100%   BMI 32.10 kg/m    General:  Weak,  NAD  Cardiovascular: RRR  Respiratory: CTABL  Abdomen: Soft/ND/NT, positive BS  Musculoskeletal: trace bilateral pedal edema, left more than rihgt  Neuro: alert, oriented x3, slowed speech, dysarthria   Discharge Instructions You were cared for by a hospitalist during your hospital stay. If you have any questions about your discharge medications or the care you received while you were in the hospital after you are discharged, you can call the unit and asked to speak with the hospitalist on call if the hospitalist that took care of you is not available. Once you are discharged, your primary care physician will handle any further medical issues. Please note that NO REFILLS for any discharge medications will be authorized once you are discharged, as it is imperative  that you return to your primary care physician (or establish a relationship with a primary care physician if you do not have one) for your aftercare needs so that they can reassess your need for medications and monitor your lab values.  Discharge Instructions    [facility] pharmacist to adjust dose for Vancomycin, Aminoglycosides and  other anti-infective therapies as requested by physician.   Complete by: As directed    [facility] pharmacist to adjust dose for Vancomycin, Aminoglycosides and other anti-infective therapies as requested by physician.   Complete by: As directed    [facility] to provide Cath Flo 2mg    Complete by: As directed    Administer for PICC line occlusion and as ordered by physician for other access device issues.   [facility] to provide Cath Flo 2mg    Complete by: As directed    Administer for PICC line occlusion and as ordered by physician for other access device issues.   Anaphylaxis Kit: Provided to treat any anaphylactic reaction to the medication being provided to the patient if First Dose or when requested by physician   Complete by: As directed    Epinephrine 1mg /ml vial / amp: Administer 0.3mg  (0.76ml) subcutaneously once for moderate to severe anaphylaxis, nurse to call physician and pharmacy when reaction occurs and call 911 if needed for immediate care   Diphenhydramine 50mg /ml IV vial: Administer 25-50mg  IV/IM PRN for first dose reaction, rash, itching, mild reaction, nurse to call physician and pharmacy when reaction occurs   Sodium Chloride 0.9% NS IV: Administer if needed for hypovolemic blood pressure drop or as ordered by physician after call to physician with anaphylactic reaction   Anaphylaxis Kit: Provided to treat any anaphylactic reaction to the medication being provided to the patient if First Dose or when requested by physician   Complete by: As directed    Epinephrine 1mg /ml vial / amp: Administer 0.3mg  (0.63ml) subcutaneously once for moderate to severe anaphylaxis, nurse to call physician and pharmacy when reaction occurs and call 911 if needed for immediate care   Diphenhydramine 50mg /ml IV vial: Administer 25-50mg  IV/IM PRN for first dose reaction, rash, itching, mild reaction, nurse to call physician and pharmacy when reaction occurs    Sodium Chloride 0.9% NS IV: Administer if needed for hypovolemic blood pressure drop or as ordered by physician after call to physician with anaphylactic reaction   Change dressing on IV access line weekly and PRN   Complete by: As directed    Change dressing on IV access line weekly and PRN   Complete by: As directed    Diet general   Complete by: As directed    Flush IV access with Sodium Chloride 0.9% and Heparin 10 units/ml or 100 units/ml   Complete by: As directed    Flush IV access with Sodium Chloride 0.9% and Heparin 10 units/ml or 100 units/ml   Complete by: As directed    Home infusion instructions - [facility]   Complete by: As directed    Instructions: Flush IV access with Sodium Chloride 0.9% and Heparin 10units/ml or 100units/ml   Change dressing on IV access line: Weekly and PRN   Instructions Cath Flo 2mg : Administer for PICC Line occlusion and as ordered by physician for other access device   [facility] pharmacist to adjust dose for: Vancomycin, Aminoglycosides and other anti-infective therapies as requested by physician   Home infusion instructions - [facility]   Complete  by: As directed    Instructions: Flush IV access with Sodium Chloride 0.9% and Heparin 10units/ml or 100units/ml   Change dressing on IV access line: Weekly and PRN   Instructions Cath Flo 2mg : Administer for PICC Line occlusion and as ordered by physician for other access device   [facility] pharmacist to adjust dose for: Vancomycin, Aminoglycosides and other anti-infective therapies as requested by physician   Increase activity slowly   Complete by: As directed    Method of administration may be changed at the discretion of home infusion pharmacist based upon assessment of the patient and/or caregiver's ability to self-administer the medication ordered   Complete by: As directed    Method of administration may be changed at the discretion of home  infusion pharmacist based upon assessment of the patient and/or caregiver's ability to self-administer the medication ordered   Complete by: As directed      Allergies as of 12/18/2019   No Known Allergies     Medication List    STOP taking these medications   lidocaine-prilocaine cream Commonly known as: EMLA     TAKE these medications   apixaban 5 MG Tabs tablet Commonly known as: ELIQUIS Take 1 tablet (5 mg total) by mouth 2 (two) times daily.   dexamethasone 4 MG tablet Commonly known as: DECADRON Take 8 mg by mouth See admin instructions. Take 2 tablets (8 mg)  by mouth with breakfast on days 2 and 3 of each treatment cycle   feeding supplement (PRO-STAT SUGAR FREE 64) Liqd Take 30 mLs by mouth 2 (two) times daily.   glycopyrrolate 1 MG tablet Commonly known as: ROBINUL Take 1 mg by mouth 3 (three) times daily as needed (drooling).   loperamide 2 MG capsule Commonly known as: IMODIUM Take 1 capsule (2 mg total) by mouth every 6 (six) hours as needed for diarrhea or loose stools.   LORazepam 1 MG tablet Commonly known as: ATIVAN Take 1 mg by mouth every 8 (eight) hours as needed for anxiety.   megestrol 40 MG/ML suspension Commonly known as: MEGACE Take 400 mg by mouth 2 (two) times daily as needed (appetite).   metoprolol tartrate 25 MG tablet Commonly known as: LOPRESSOR Take 1 tablet (25 mg total) by mouth 2 (two) times daily. Hold if sbp less than 100   nystatin 100000 UNIT/ML suspension Commonly known as: MYCOSTATIN Take 5 mLs by mouth every 4 (four) hours as needed (thrush).   ondansetron 8 MG tablet Commonly known as: ZOFRAN Take 8 mg by mouth every 8 (eight) hours as needed for nausea.   prochlorperazine 10 MG tablet Commonly known as: COMPAZINE Take 10 mg by mouth every 8 (eight) hours as needed for nausea or vomiting.   vancomycin  IVPB Inject 750 mg into the vein daily for 11 days. Indication: Bacteremia  First Dose: No Last Day of Therapy:   12/29/19 Labs - Sunday/Monday:  CBC/D, BMP, and vancomycin trough. Labs - Thursday:  BMP and vancomycin trough Labs - Every other week:  ESR and CRP Method of administration:Elastomeric Method of administration may be changed at the discretion of the patient and/or caregiver's ability to self-administer the medication ordered.            Discharge Care Instructions  (From admission, onward)         Start     Ordered   12/18/19 0000  Change dressing on IV access line weekly and PRN  (Home infusion instructions - [facility] )  12/18/19 1018   12/18/19 0000  Change dressing on IV access line weekly and PRN  (Home infusion instructions - [facility] )     12/18/19 1157         No Known Allergies Follow-up Information    Dunn, Dayna N, PA-C Follow up in 3 week(s).   Specialties: Cardiology, Radiology Why: for pericardioeffusion  Contact information: [address] [place] [address] [phone]        Jaci Standard, MD Follow up.   Specialty: Hematology and Oncology Why: Please call hematology oncology's office if you do not hear from them in 3 business days. Contact information: [address] [place] [address] [phone]            The results of significant diagnostics from this hospitalization (including imaging, microbiology, ancillary and laboratory) are listed below for reference.    Significant Diagnostic Studies: IR REMOVAL TUN ACCESS W/ PORT W/O FL MOD SED  Result Date: 12/15/2019 INDICATION: 54 year old female with a history of right port catheter infection referred for removal EXAM: REMOVAL RIGHT IJ VEIN PORT-A-CATH MEDICATIONS: 2 g Ancef; The antibiotic was administered within an appropriate time interval prior to skin puncture. ANESTHESIA/SEDATION: Moderate (conscious) sedation was employed during this procedure. A total of Versed 2.0 mg and Fentanyl 100 mcg was administered  intravenously. Moderate Sedation Time: 25 minutes. The patient's level of consciousness and vital signs were monitored continuously by radiology nursing throughout the procedure under my direct supervision. FLUOROSCOPY TIME:  None COMPLICATIONS: None PROCEDURE: Informed consent was obtained from the patient following an explanation of the procedure, risks, benefits and alternatives. The patient understands, agrees and consents for the procedure. All questions were addressed. A time out was performed prior to the initiation of the procedure. The patient was positioned in the operation suite in the supine position on a gantry. Visual inspection demonstrates no redness at the site with no ballotable fluid collection. No purulence. The previous scar on the right chest was generously infiltrated with 1% lidocaine for local anesthesia. Infiltration of the skin and subcutaneous tissues surrounding the port was performed. Using sharp and blunt dissection, the double-lumen port apparatus and subcutaneous catheter were removed in their entirety. No purulence within the pocket which was in good repair. Generous irrigation was performed. The port pocket was then closed with interrupted Vicryl layer and a running subcuticular with 4-0 Monocryl. The skin was sealed with Derma bond. A sterile dressing was placed. Tip culture was sent. The patient tolerated the procedure well and remained hemodynamically stable throughout. No complications were encountered and no significant blood loss was encountered. IMPRESSION: Status post right IJ port catheter removal. Signed, Yvone Neu. Reyne Dumas, RPVI [facility] Electronically Signed   By: Gilmer Mor D.O.   On: 12/15/2019 16:36   DG CHEST PORT 1 VIEW  Result Date: 12/17/2019 CLINICAL DATA:  Cough EXAM: PORTABLE CHEST 1 VIEW COMPARISON:  12/10/2019 FINDINGS: The heart size and mediastinal contours are within normal limits.  Unchanged elevation of the right hemidiaphragm with associated atelectasis or consolidation. The visualized skeletal structures are unremarkable. IMPRESSION: Unchanged elevation of the right hemidiaphragm with associated atelectasis or consolidation. No acute abnormality of the lungs in AP portable projection. Electronically Signed   By: Lauralyn Primes M.D.   On: 12/17/2019 13:35   DG Chest Port 1 View  Result Date: 12/10/2019 CLINICAL DATA:  54 year old female with weakness. EXAM: PORTABLE CHEST 1 VIEW COMPARISON:  Chest radiograph dated 10/25/2019. FINDINGS: Right-sided Port-A-Cath with tip at the cavoatrial junction. There is no focal consolidation, pleural effusion, pneumothorax. Mild cardiomegaly. Atherosclerotic calcification of the aortic arch. No acute osseous pathology. IMPRESSION: 1. No acute cardiopulmonary process. 2. Mild cardiomegaly. Electronically Signed   By: Elgie Collard M.D.   On: 12/10/2019 16:21   DG Abd Portable 1V  Result Date: 12/11/2019 CLINICAL DATA:  Nausea weakness in emesis EXAM: PORTABLE ABDOMEN - 1 VIEW COMPARISON:  CT study 07/30/2019 FINDINGS: Centralized mildly dilated loops of small bowel. No visible colonic or rectal gas. LEFT flank excluded from view. Calcified fibroid in the LEFT lower quadrant. No acute bone process. IMPRESSION: Centralized mildly dilated loops of small bowel. No visible colonic or rectal gas. Findings may reflect ileus versus early or small bowel obstruction. Electronically Signed   By: Donzetta Kohut M.D.   On: 12/11/2019 14:46   ECHOCARDIOGRAM COMPLETE  Result Date: 12/13/2019    ECHOCARDIOGRAM REPORT   Patient Name:   VICTORIAN SCHINKEL Date of Exam: 12/13/2019 Medical Rec #:  [id number]       Height:       66.0 in Accession #:    [id number]      Weight:       198.9 lb Date of Birth:  1965/11/30      BSA:          1.995 m Patient Age:    53 years        BP:           112/68 mmHg Patient Gender: F               HR:           100 bpm. Exam Location:   Inpatient Procedure: 2D Echo and Intracardiac Opacification Agent Indications:    Corynebacteria Bacteremia 790.7 / R78.81  History:        Patient has prior history of Echocardiogram examinations, most                 recent 10/26/2019. Stroke. Metastatic intrahepatic                 cholangiocarcinoma, Pericardial effusion, Sinus Tachycardia.  Sonographer:    Leta Jungling RDCS Referring Phys: [id number] CORNELIUS N VAN DAM  Sonographer Comments: Suboptimal apical window. IMPRESSIONS  1. Left ventricular ejection fraction, by estimation, is 55 to 60%. The left ventricle has normal function. The left ventricle has no regional wall motion abnormalities. There is mild left ventricular hypertrophy. Indeterminate diastolic filling due to E-A fusion.  2. Right ventricular systolic function is normal. The right ventricular size is normal. Tricuspid regurgitation signal is inadequate for assessing PA pressure.  3. Left atrial size was mildly dilated.  4. Moderate to large pericardial effusion. The pericardial effusion is anterior to the right ventricle and surrounding the apex. There is no evidence of cardiac tamponade. No RV diastolic collapse, IVC appears small and collapsible. Respirometer evaluation not performed.  5. The mitral valve is normal in structure. No evidence of mitral valve regurgitation. No evidence of mitral stenosis.  6. The aortic valve is tricuspid. Aortic valve regurgitation is mild. No aortic stenosis is present.  7. The inferior vena cava is normal in size with greater than 50% respiratory variability, suggesting right atrial pressure of 3 mmHg. Conclusion(s)/Recommendation(s): No evidence of valvular vegetations on this transthoracic echocardiogram. Would recommend a transesophageal echocardiogram to exclude infective endocarditis if clinically indicated. FINDINGS  Left Ventricle: Left ventricular ejection fraction,  by estimation, is 55 to 60%. The left ventricle has normal function. The left ventricle  has no regional wall motion abnormalities. Definity contrast agent was given IV to delineate the left ventricular  endocardial borders. The left ventricular internal cavity size was normal in size. There is mild left ventricular hypertrophy. Indeterminate diastolic filling due to E-A fusion. Right Ventricle: The right ventricular size is normal. No increase in right ventricular wall thickness. Right ventricular systolic function is normal. Tricuspid regurgitation signal is inadequate for assessing PA pressure. Left Atrium: Left atrial size was mildly dilated. Right Atrium: Right atrial size was normal in size. Pericardium: Moderate to large pericardial effusion. The pericardial effusion is anterior to the right ventricle and surrounding the apex. There is no evidence of cardiac tamponade. Mitral Valve: The mitral valve is normal in structure. Normal mobility of the mitral valve leaflets. No evidence of mitral valve regurgitation. No evidence of mitral valve stenosis. Tricuspid Valve: The tricuspid valve is normal in structure. Tricuspid valve regurgitation is not demonstrated. No evidence of tricuspid stenosis. Aortic Valve: The aortic valve is tricuspid. Aortic valve regurgitation is mild. No aortic stenosis is present. Pulmonic Valve: The pulmonic valve was normal in structure. Pulmonic valve regurgitation is not visualized. No evidence of pulmonic stenosis. Aorta: The aortic root is normal in size and structure. Venous: The inferior vena cava is normal in size with greater than 50% respiratory variability, suggesting right atrial pressure of 3 mmHg. IAS/Shunts: No atrial level shunt detected by color flow Doppler.  LEFT VENTRICLE PLAX 2D LVIDd:         3.40 cm     Diastology LVIDs:         2.40 cm     LV e' lateral:   4.46 cm/s LV PW:         1.00 cm     LV E/e' lateral: 12.5 LV IVS:        1.10 cm     LV e' medial:    5.22 cm/s LVOT diam:     2.00 cm     LV E/e' medial:  10.7 LV SV:         34 LV SV Index:   17  LVOT Area:     3.14 cm  LV Volumes (MOD) LV vol d, MOD A2C: 78.6 ml LV vol d, MOD A4C: 93.2 ml LV vol s, MOD A2C: 38.6 ml LV vol s, MOD A4C: 50.6 ml LV SV MOD A2C:     40.0 ml LV SV MOD A4C:     93.2 ml LV SV MOD BP:      40.3 ml RIGHT VENTRICLE RV S prime:     27.60 cm/s LEFT ATRIUM           Index       RIGHT ATRIUM          Index LA diam:      2.60 cm 1.30 cm/m  RA Area:     9.78 cm LA Vol (A2C): 28.5 ml 14.29 ml/m RA Volume:   17.25 ml 8.65 ml/m LA Vol (A4C): 74.1 ml 37.14 ml/m  AORTIC VALVE LVOT Vmax:   75.00 cm/s LVOT Vmean:  54.700 cm/s LVOT VTI:    0.108 m  AORTA Ao Root diam: 3.00 cm MITRAL VALVE MV Area (PHT): 6.43 cm    SHUNTS MV Decel Time: 118 msec    Systemic VTI:  0.11 m MV E velocity: 55.60 cm/s  Systemic Diam: 2.00 cm MV A velocity:  98.20 cm/s MV E/A ratio:  0.57 Weston Brass MD Electronically signed by Weston Brass MD Signature Date/Time: 12/13/2019/7:29:39 PM    Final    [place] EKG SITE RITE  Result Date: 12/17/2019 If Site Rite image not attached, placement could not be confirmed due to current cardiac rhythm.   Microbiology: Recent Results (from the past 240 hour(s))  Blood culture (routine x 2)     Status: Abnormal   Collection Time: 12/10/19  3:28 PM   Specimen: BLOOD  Result Value Ref Range Status   Specimen Description BLOOD SITE NOT SPECIFIED  Final   Special Requests   Final    BOTTLES DRAWN AEROBIC AND ANAEROBIC Blood Culture adequate volume   Culture  Setup Time   Final    GRAM POSITIVE RODS AEROBIC BOTTLE ONLY CRITICAL RESULT CALLED TO, READ BACK BY AND VERIFIED WITH: L. CURRAN,PHARMD 2127 12/11/2019 T. TYSOR    Culture (A)  Final    DIPHTHEROIDS(CORYNEBACTERIUM SPECIES) Standardized susceptibility testing for this organism is not available. Performed at [facility], [address], [place], [place] [address]    Report Status 12/12/2019 FINAL  Final  Respiratory Panel by RT PCR (Flu A&B, Covid) - Nasopharyngeal Swab     Status: None    Collection Time: 12/10/19  6:03 PM   Specimen: Nasopharyngeal Swab  Result Value Ref Range Status   SARS Coronavirus 2 by RT PCR NEGATIVE NEGATIVE Final    Comment: (NOTE) SARS-CoV-2 target nucleic acids are NOT DETECTED. The SARS-CoV-2 RNA is generally detectable in upper respiratoy specimens during the acute phase of infection. The lowest concentration of SARS-CoV-2 viral copies this assay can detect is 131 copies/mL. A negative result does not preclude SARS-Cov-2 infection and should not be used as the sole basis for treatment or other patient management decisions. A negative result may occur with  improper specimen collection/handling, submission of specimen other than nasopharyngeal swab, presence of viral mutation(s) within the areas targeted by this assay, and inadequate number of viral copies (<131 copies/mL). A negative result must be combined with clinical observations, patient history, and epidemiological information. The expected result is Negative. Fact Sheet for Patients:  https://www.moore.com/ Fact Sheet for Healthcare Providers:  https://www.young.biz/ This test is not yet ap proved or cleared by the [place] FDA and  has been authorized for detection and/or diagnosis of SARS-CoV-2 by FDA under an Emergency Use Authorization (EUA). This EUA will remain  in effect (meaning this test can be used) for the duration of the COVID-19 declaration under Section 564(b)(1) of the Act, 21 U.S.C. section 360bbb-3(b)(1), unless the authorization is terminated or revoked sooner.    Influenza A by PCR NEGATIVE NEGATIVE Final   Influenza B by PCR NEGATIVE NEGATIVE Final    Comment: (NOTE) The Xpert Xpress SARS-CoV-2/FLU/RSV assay is intended as an aid in  the diagnosis of influenza from Nasopharyngeal swab specimens and  should not be used as a sole basis for treatment. Nasal washings and  aspirates are unacceptable for Xpert Xpress  SARS-CoV-2/FLU/RSV  testing. Fact Sheet for Patients: https://www.moore.com/ Fact Sheet for Healthcare Providers: https://www.young.biz/ This test is not yet approved or cleared by the [place] FDA and  has been authorized for detection and/or diagnosis of SARS-CoV-2 by  FDA under an Emergency Use Authorization (EUA). This EUA will remain  in effect (meaning this test can be used) for the duration of the  Covid-19 declaration under Section 564(b)(1) of the Act, 21  U.S.C. section 360bbb-3(b)(1), unless the authorization  is  terminated or revoked. Performed at [facility], [address] [place], [place] [address]   Blood culture (routine x 2)     Status: Abnormal   Collection Time: 12/10/19  8:47 PM   Specimen: BLOOD  Result Value Ref Range Status   Specimen Description BLOOD SITE NOT SPECIFIED  Final   Special Requests   Final    BOTTLES DRAWN AEROBIC AND ANAEROBIC Blood Culture results may not be optimal due to an inadequate volume of blood received in culture bottles   Culture  Setup Time   Final    GRAM POSITIVE RODS AEROBIC BOTTLE ONLY CRITICAL RESULT CALLED TO, READ BACK BY AND VERIFIED WITH: L. CURRAN,PHARMD 2127 12/11/2019 T. TYSOR    Culture (A)  Final    CORYNEBACTERIUM, GROUP JK Standardized susceptibility testing for this organism is not available. Performed at [facility], [address], [place], [place] [address]    Report Status 12/12/2019 FINAL  Final  C Difficile Quick Screen w PCR reflex     Status: None   Collection Time: 12/11/19 10:12 AM   Specimen: STOOL  Result Value Ref Range Status   C Diff antigen NEGATIVE NEGATIVE Final   C Diff toxin NEGATIVE NEGATIVE Final   C Diff interpretation No C. difficile detected.  Final    Comment: Performed at [facility], [address], [place], [place] [address]  Gastrointestinal Panel by PCR , Stool     Status: None   Collection Time: 12/11/19 10:12  AM   Specimen: Stool  Result Value Ref Range Status   Campylobacter species NOT DETECTED NOT DETECTED Final   Plesimonas shigelloides NOT DETECTED NOT DETECTED Final   Salmonella species NOT DETECTED NOT DETECTED Final   Yersinia enterocolitica NOT DETECTED NOT DETECTED Final   Vibrio species NOT DETECTED NOT DETECTED Final   Vibrio cholerae NOT DETECTED NOT DETECTED Final   Enteroaggregative E coli (EAEC) NOT DETECTED NOT DETECTED Final   Enteropathogenic E coli (EPEC) NOT DETECTED NOT DETECTED Final   Enterotoxigenic E coli (ETEC) NOT DETECTED NOT DETECTED Final   Shiga like toxin producing E coli (STEC) NOT DETECTED NOT DETECTED Final   Shigella/Enteroinvasive E coli (EIEC) NOT DETECTED NOT DETECTED Final   Cryptosporidium NOT DETECTED NOT DETECTED Final   Cyclospora cayetanensis NOT DETECTED NOT DETECTED Final   Entamoeba histolytica NOT DETECTED NOT DETECTED Final   Giardia lamblia NOT DETECTED NOT DETECTED Final   Adenovirus F40/41 NOT DETECTED NOT DETECTED Final   Astrovirus NOT DETECTED NOT DETECTED Final   Norovirus GI/GII NOT DETECTED NOT DETECTED Final   Rotavirus A NOT DETECTED NOT DETECTED Final   Sapovirus (I, II, IV, and V) NOT DETECTED NOT DETECTED Final    Comment: Performed at [facility], [address] [place], [place] [address]  Culture, blood (x 2)     Status: Abnormal   Collection Time: 12/12/19  7:39 AM   Specimen: BLOOD  Result Value Ref Range Status   Specimen Description BLOOD SITE NOT SPECIFIED  Final   Special Requests   Final    BOTTLES DRAWN AEROBIC ONLY Blood Culture results may not be optimal due to an inadequate volume of blood received in culture bottles   Culture  Setup Time   Final    AEROBIC BOTTLE ONLY GRAM POSITIVE RODS CRITICAL VALUE NOTED.  VALUE IS CONSISTENT WITH PREVIOUSLY REPORTED AND CALLED VALUE.    Culture (A)  Final    CORYNEBACTERIUM,  GROUP JK Standardized susceptibility testing for this organism is not  available. Performed at [facility], [address] [place], [place] [address]    Report Status 12/15/2019 FINAL  Final  Culture, blood (x 2)     Status: Abnormal   Collection Time: 12/12/19  7:53 AM   Specimen: BLOOD  Result Value Ref Range Status   Specimen Description BLOOD SITE NOT SPECIFIED  Final   Special Requests   Final    BOTTLES DRAWN AEROBIC ONLY Blood Culture results may not be optimal due to an inadequate volume of blood received in culture bottles   Culture  Setup Time   Final    AEROBIC BOTTLE ONLY GRAM POSITIVE RODS CRITICAL VALUE NOTED.  VALUE IS CONSISTENT WITH PREVIOUSLY REPORTED AND CALLED VALUE.    Culture (A)  Final    CORYNEBACTERIUM, GROUP JK Standardized susceptibility testing for this organism is not available. Performed at [facility], [address], [place], [place] [address]    Report Status 12/15/2019 FINAL  Final  Cath Tip Culture     Status: None   Collection Time: 12/15/19  2:42 PM   Specimen: Catheter Tip; Other  Result Value Ref Range Status   Specimen Description CATH TIP PORTA CATH  Final   Special Requests NONE  Final   Culture   Final    NO GROWTH 2 DAYS Performed at [facility], [address], [place], [place] [address]    Report Status 12/18/2019 FINAL  Final  Culture, blood (Routine X 2) w Reflex to ID Panel     Status: None (Preliminary result)   Collection Time: 12/16/19  8:43 AM   Specimen: BLOOD  Result Value Ref Range Status   Specimen Description BLOOD LEFT ANTECUBITAL  Final   Special Requests   Final    AEROBIC BOTTLE ONLY Blood Culture results may not be optimal due to an inadequate volume of blood received in culture bottles   Culture   Final    NO GROWTH 2 DAYS Performed at [facility], [address] [place], [place] [address]    Report Status PENDING  Incomplete  Culture, blood (Routine X 2) w Reflex to ID Panel     Status: None (Preliminary result)   Collection Time: 12/16/19  9:04 AM    Specimen: BLOOD RIGHT HAND  Result Value Ref Range Status   Specimen Description BLOOD RIGHT HAND  Final   Special Requests   Final    AEROBIC BOTTLE ONLY Blood Culture results may not be optimal due to an inadequate volume of blood received in culture bottles   Culture   Final    NO GROWTH 2 DAYS Performed at [facility], [address], [place], [place] [address]    Report Status PENDING  Incomplete     Labs: Basic Metabolic Panel: Recent Labs  Lab 12/11/19 1302 12/11/19 1302 12/12/19 0739 12/12/19 1555 12/13/19 0322 12/13/19 1555 12/14/19 0331 12/15/19 0439 12/16/19 0543 12/17/19 0443 12/18/19 0409  NA 136   < > 137   < > 137   < > 139 140 138 136 137  K 3.2*   < > 3.1*   < > 3.5   < > 3.4* 3.7 4.1 3.7 3.5  CL 106   < > 109   < > 109   < > 109 110 107 107 109  CO2 21*   < > 17*   < > 20*   < >  22 15* 20* 20* 20*  GLUCOSE 143*   < > 90   < > 95   < > 89 89 90 92 88  BUN 21*   < > 19   < > 21*   < > 21* 21* 22* 23* 24*  CREATININE 1.56*   < > 1.29*   < > 1.31*   < > 1.17* 1.25* 1.23* 1.17* 1.41*  CALCIUM 5.7*   < > 5.6*   < > 6.0*   < > 6.5* 6.8* 6.5* 6.5* 6.5*  MG 1.2*   < > 1.6*   < > 2.0   < > 1.8 1.7 1.9 1.7 2.2  PHOS <1.0*  --  2.0*  --  2.4*  --  2.8 3.0  --   --   --    < > = values in this interval not displayed.   Liver Function Tests: Recent Labs  Lab 12/11/19 1302 12/17/19 0443  AST 26 25  ALT 15 15  ALKPHOS 91 180*  BILITOT 1.1 1.0  PROT 4.9* 5.4*  ALBUMIN 2.2* 1.9*   No results for input(s): LIPASE, AMYLASE in the last 168 hours. No results for input(s): AMMONIA in the last 168 hours. CBC: Recent Labs  Lab 12/14/19 0331 12/15/19 0439 12/16/19 0543 12/17/19 0443 12/18/19 0409  WBC 2.7* 5.2 6.4 9.0 11.9*  NEUTROABS 0.3* 1.1* 2.2 7.4 6.9  HGB 8.8* 9.5* 8.6* 9.4* 9.0*  HCT 27.4* 29.3* 27.3* 29.8* 28.3*  MCV 80.8 81.4 83.2 83.0 83.0  PLT 71* 125* 163 183 202   Cardiac Enzymes: No results for input(s): CKTOTAL, CKMB, CKMBINDEX,  TROPONINI in the last 168 hours. BNP: BNP (last 3 results) No results for input(s): BNP in the last 8760 hours.  ProBNP (last 3 results) No results for input(s): PROBNP in the last 8760 hours.  CBG: Recent Labs  Lab 12/16/19 0637 12/16/19 2218 12/17/19 0626 12/18/19 0622  GLUCAP 84 87 79 83       Signed:  Albertine Grates MD, PhD, FACP  [facility] 12/18/2019, 12:22 PM

## 2019-12-18 NOTE — Progress Notes (Addendum)
Pt discharge education and instructions completed with pt and spouse at bedside; both voices understanding and denies any questions. Pt right chest dsg remains unremarkable, clean, dry and intact. Pt PICC flushed and capped by IV team per protocol. Telemetry removed and pt spouse handed pt vancomycin prescription. Pt and spouse to also pick up electronically sent prescriptions from preferred pharmacy on file. Pt discharge home spouse to transport pt home. Pt transported off unit via wheelchair with spouse and belongings to the side. Delia Heady RN

## 2019-12-18 NOTE — TOC Transition Note (Signed)
Transition of Care [facility]) - CM/SW Discharge Note   Patient Details  Name: AGUSTINA YOUNGBLOOD MRN: [id number] Date of Birth: 1966-04-10  [facility] CM/SW Contact:  Carles Collet, RN Phone Number: 12/18/2019, 1:09 PM   Clinical Narrative:    Notified Barb Merino and Pam w [facility] that patient will DC today. Faxed Vanc order to their pharmacy at [phone] at the request of Pam. [facility] to start Monday, plan has been arranged with patient, [facility], and [facility].  Ok to DC from [employer] standpoint after today's Vanc has been infused.     Barriers to Discharge: Continued Medical Work up   Patient Goals and CMS Choice   CMS Medicare.gov Compare Post Acute Care list provided to:: Patient Choice offered to / list presented to : Patient  Discharge Placement                       Discharge Plan and Services   Discharge Planning Services: CM Consult Post Acute Care Choice: Home Health                    HH Arranged: RN, PT [facility] Agency: [place] Date [place]: 12/16/19   Representative spoke with at [place]: [place]) Interventions     Readmission Risk Interventions No flowsheet data found.

## 2019-12-18 NOTE — Progress Notes (Signed)
Lab called to pt Vanc [place] of 24. Dr. Erlinda Hong notified. Will continue to monitor pt. Delia Heady RN

## 2019-12-18 NOTE — Progress Notes (Signed)
Left lower extremity venous duplex has been completed. Preliminary results can be found in CV Proc through chart review.  Results were given to Dr. Erlinda Hong.  12/18/19 11:46 AM Melissa Hurley RVT

## 2019-12-21 LAB — CULTURE, BLOOD (ROUTINE X 2)
Culture: NO GROWTH
Culture: NO GROWTH

## 2019-12-23 ENCOUNTER — Telehealth: Payer: Self-pay | Admitting: *Deleted

## 2019-12-23 ENCOUNTER — Encounter: Payer: Self-pay | Admitting: Infectious Disease

## 2019-12-23 NOTE — Telephone Encounter (Signed)
Received call from pt's [place], Eustaquio Maize. She is calling to confirm pt's next appt date and time with Dr. Lorenso Courier.  Pt is re-establishing care here @ [facility].   Advised that pt's next appt is 01/03/20  @ 2:15 pm for labs and 3pm with Dr. Lorenso Courier. Beth, RN states that she is obtaining labs 2 x a week on pt as she is receiving daily IV Vancomycin.  Pt is also receiving 1 liter LR 2 x this week as she had been dehydrated prior to recent hospitalization and currently is not drinking or eating much despite being on Megace. Beth, RN also asking if we would be giving any further orders for IV fluids. Advised that we would not until she is seen here in the clinic. Advised that infectious Disease could order the fluids as they are ordering her IV vancomycin.  We can evaluate for further IV fluids when we see her in clinic on 01/03/20. Beth, RN will fax pt's lab results to [place] as well as to [place].  She will let pt and her husband know about appts on 01/03/20

## 2020-01-02 ENCOUNTER — Other Ambulatory Visit: Payer: Self-pay | Admitting: Hematology and Oncology

## 2020-01-02 DIAGNOSIS — C221 Intrahepatic bile duct carcinoma: Secondary | ICD-10-CM

## 2020-01-03 ENCOUNTER — Inpatient Hospital Stay: Payer: BC Managed Care – PPO

## 2020-01-03 ENCOUNTER — Telehealth: Payer: Self-pay | Admitting: *Deleted

## 2020-01-03 ENCOUNTER — Inpatient Hospital Stay: Payer: BC Managed Care – PPO | Attending: Hematology and Oncology | Admitting: Hematology and Oncology

## 2020-01-03 ENCOUNTER — Other Ambulatory Visit: Payer: Self-pay

## 2020-01-03 ENCOUNTER — Other Ambulatory Visit: Payer: Self-pay | Admitting: *Deleted

## 2020-01-03 VITALS — BP 93/65 | HR 91 | Temp 98.5°F | Resp 18 | Ht 66.0 in | Wt 203.8 lb

## 2020-01-03 DIAGNOSIS — R112 Nausea with vomiting, unspecified: Secondary | ICD-10-CM | POA: Diagnosis not present

## 2020-01-03 DIAGNOSIS — Z452 Encounter for adjustment and management of vascular access device: Secondary | ICD-10-CM

## 2020-01-03 DIAGNOSIS — Z8673 Personal history of transient ischemic attack (TIA), and cerebral infarction without residual deficits: Secondary | ICD-10-CM | POA: Diagnosis not present

## 2020-01-03 DIAGNOSIS — R630 Anorexia: Secondary | ICD-10-CM | POA: Insufficient documentation

## 2020-01-03 DIAGNOSIS — C221 Intrahepatic bile duct carcinoma: Secondary | ICD-10-CM | POA: Diagnosis not present

## 2020-01-03 DIAGNOSIS — R7881 Bacteremia: Secondary | ICD-10-CM | POA: Diagnosis not present

## 2020-01-03 DIAGNOSIS — Z7901 Long term (current) use of anticoagulants: Secondary | ICD-10-CM | POA: Diagnosis not present

## 2020-01-03 DIAGNOSIS — I639 Cerebral infarction, unspecified: Secondary | ICD-10-CM

## 2020-01-03 DIAGNOSIS — R251 Tremor, unspecified: Secondary | ICD-10-CM | POA: Diagnosis not present

## 2020-01-03 DIAGNOSIS — R634 Abnormal weight loss: Secondary | ICD-10-CM | POA: Diagnosis not present

## 2020-01-03 LAB — CMP (CANCER CENTER ONLY)
ALT: 14 U/L (ref 0–44)
AST: 39 U/L (ref 15–41)
Albumin: 1.9 g/dL — ABNORMAL LOW (ref 3.5–5.0)
Alkaline Phosphatase: 353 U/L — ABNORMAL HIGH (ref 38–126)
Anion gap: 11 (ref 5–15)
BUN: 14 mg/dL (ref 6–20)
CO2: 20 mmol/L — ABNORMAL LOW (ref 22–32)
Calcium: 6.4 mg/dL — CL (ref 8.9–10.3)
Chloride: 111 mmol/L (ref 98–111)
Creatinine: 1.58 mg/dL — ABNORMAL HIGH (ref 0.44–1.00)
GFR, Est AFR Am: 43 mL/min — ABNORMAL LOW (ref >60)
GFR, Estimated: 37 mL/min — ABNORMAL LOW (ref >60)
Glucose, Bld: 117 mg/dL — ABNORMAL HIGH (ref 70–99)
Potassium: 3.5 mmol/L (ref 3.5–5.1)
Sodium: 142 mmol/L (ref 135–145)
Total Bilirubin: 0.8 mg/dL (ref 0.3–1.2)
Total Protein: 6.2 g/dL — ABNORMAL LOW (ref 6.5–8.1)

## 2020-01-03 LAB — CBC WITH DIFFERENTIAL (CANCER CENTER ONLY)
Abs Immature Granulocytes: 0.07 10*3/uL (ref 0.00–0.07)
Basophils Absolute: 0.1 10*3/uL (ref 0.0–0.1)
Basophils Relative: 1 %
Eosinophils Absolute: 0 10*3/uL (ref 0.0–0.5)
Eosinophils Relative: 0 %
HCT: 26.7 % — ABNORMAL LOW (ref 36.0–46.0)
Hemoglobin: 8.1 g/dL — ABNORMAL LOW (ref 12.0–15.0)
Immature Granulocytes: 1 %
Lymphocytes Relative: 18 %
Lymphs Abs: 2.4 10*3/uL (ref 0.7–4.0)
MCH: 26 pg (ref 26.0–34.0)
MCHC: 30.3 g/dL (ref 30.0–36.0)
MCV: 85.9 fL (ref 80.0–100.0)
Monocytes Absolute: 1.2 10*3/uL — ABNORMAL HIGH (ref 0.1–1.0)
Monocytes Relative: 9 %
Neutro Abs: 10 10*3/uL — ABNORMAL HIGH (ref 1.7–7.7)
Neutrophils Relative %: 71 %
Platelet Count: 235 10*3/uL (ref 150–400)
RBC: 3.11 MIL/uL — ABNORMAL LOW (ref 3.87–5.11)
RDW: 19.9 % — ABNORMAL HIGH (ref 11.5–15.5)
WBC Count: 13.8 10*3/uL — ABNORMAL HIGH (ref 4.0–10.5)
nRBC: 0 % (ref 0.0–0.2)

## 2020-01-03 LAB — LACTATE DEHYDROGENASE: LDH: 701 U/L — ABNORMAL HIGH (ref 98–192)

## 2020-01-03 MED ORDER — SODIUM CHLORIDE 0.9 % IV SOLN
Freq: Once | INTRAVENOUS | Status: AC
  Filled 2020-01-03: qty 250

## 2020-01-03 MED ORDER — HEPARIN SOD (PORK) LOCK FLUSH 100 UNIT/ML IV SOLN
250.0000 [IU] | Freq: Once | INTRAVENOUS | Status: AC
  Administered 2020-01-03: 250 [IU] via INTRAVENOUS
  Filled 2020-01-03: qty 5

## 2020-01-03 MED ORDER — SODIUM CHLORIDE 0.9% FLUSH
10.0000 mL | INTRAVENOUS | Status: DC | PRN
  Administered 2020-01-03: 10 mL
  Filled 2020-01-03: qty 10

## 2020-01-03 MED ORDER — HEPARIN SOD (PORK) LOCK FLUSH 100 UNIT/ML IV SOLN
500.0000 [IU] | Freq: Once | INTRAVENOUS | Status: AC | PRN
  Administered 2020-01-03: 500 [IU]
  Filled 2020-01-03: qty 5

## 2020-01-03 MED ORDER — SODIUM CHLORIDE 0.9 % IV SOLN
INTRAVENOUS | Status: DC
  Filled 2020-01-03 (×2): qty 1000

## 2020-01-03 NOTE — Telephone Encounter (Signed)
Critical value received from lab, calcium 6.4.  Message given to Dr. Lorenso Courier directly @ 1524.

## 2020-01-03 NOTE — Patient Instructions (Signed)
Calcium gluconate injection What is this medicine? CALCIUM GLUCONATE (KAL see um GLOO koe nate) is a calcium salt. It is used to prevent and to treat low calcium levels. This medicine may be used for other purposes; ask your health care provider or pharmacist if you have questions. What should I tell my health care provider before I take this medicine? They need to know if you have any of these conditions:  high levels of calcium in the blood  history of irregular heartbeat  history of kidney stones  kidney disease  parathyroid disease  an unusual or allergic reaction to calcium, other medicines, foods, dyes, or preservatives  pregnant or trying to get pregnant  breast-feeding How should I use this medicine? This medicine is for infusion into a vein. It is given by a health care professional in a hospital or clinic setting. Talk to your pediatrician regarding the use of this medicine in children. While this drug may be prescribed for selected conditions, precautions do apply. Overdosage: If you think you have taken too much of this medicine contact a poison control center or emergency room at once. NOTE: This medicine is only for you. Do not share this medicine with others. What if I miss a dose? This does not apply. What may interact with this medicine?  ceftriaxone  certain diuretics  digoxin  other calcium products This list may not describe all possible interactions. Give your health care provider a list of all the medicines, herbs, non-prescription drugs, or dietary supplements you use. Also tell them if you smoke, drink alcohol, or use illegal drugs. Some items may interact with your medicine. What should I watch for while using this medicine? Your condition will be monitored carefully while you are receiving this medicine. You may need blood work done while you are taking this medicine. What side effects may I notice from receiving this medicine? Side effects that  you should report to your doctor or health care professional as soon as possible:  allergic reactions like skin rash, itching or hives, swelling of the face, lips, or tongue  fast, irregular heartbeat  low blood pressure  signs and symptoms of high calcium like thirst, severe or continued nausea or vomiting, constipation, stomach pain, muscle weakness, fatigue, confusion, or difficulty in concentration Side effects that usually do not require medical attention (report these to your doctor or health care professional if they continue or are bothersome):  changes in taste  tingling in hands or feet This list may not describe all possible side effects. Call your doctor for medical advice about side effects. You may report side effects to FDA at 1-800-FDA-1088. Where should I keep my medicine? This drug is given in a hospital or clinic and will not be stored at home. NOTE: This sheet is a summary. It may not cover all possible information. If you have questions about this medicine, talk to your doctor, pharmacist, or health care provider.  2020 Elsevier/Gold Standard (2017-03-18 11:47:26)

## 2020-01-03 NOTE — Progress Notes (Signed)
[place] [phone]   [phone]  PROGRESS NOTE  Patient Care Team: Patient, No Pcp Per as PCP - General (General Practice) Constance Haw, MD as PCP - Cardiology (Cardiology)  Hematological/Oncological History  #Metastatic Intrahepatic Cholangiocarcinoma, Stage IV 1) 07/30/2019: CT scan performed for generalized abdominal pain and bloating. Findings showed multiple hypervascular masses, abdominal lymphadenopathy, lung nodules, and thickening of the GE junction.  2) 08/04/2019: EGD showed candida infection of the esophagus, small hiatal hernia, with no other concerning findings.Colonoscopy performed with no malignancy or abnormalities noted. 3) 08/06/2019: establish care with Dr. Lorenso Courier 4) 08/17/2019: US guided biopsy of the liver lesion, confirmed adenocarcinoma without clearly denoting the origin. Pancreaticobiliary vs GYN origin.  5) 08/26/2019: underwent endometrial biopsy with Ob/Gyn. No evidence of GYN malignancy.   6)  10/04/2019: started Cycle 1 Day 1 Gem/cis with Dr. Jimmy Footman 7) 11/03/2019: Cycle 2 Day 1 of Gem/cis (delayed due to CVA)  8) 11/24/2019: Cycle 3 Day 1 of Gem/cis 9) 12/16/2019: Reconnected with [facility] Team after admission for bacteremia.  10) 01/03/2020: re-establish care with Dr. Lorenso Courier   Interval History:  Melissa Hurley 54 y.o. female with medical history significant for metastatic cholangiocarcinoma presents for a follow up visit. The patient's last visit was on 08/27/2019, after which time her care was assumed by the second opinion provider at [facility]. In the interim since the last visit she has been treated with cisplatin/gemcitabine at [facility]. She has also had a CVA and was recently admitted to [facility] from 12/10/2019 to 12/18/2019 for bacteremia.   On exam today Ms. Bankson notes that she has not been feeling very well since her discharge from the hospital.  She notes that she continues to have fatigue and has  not been eating well.  She spends most the day sleeping approximately 12 to 14 hours/day.  She notes that she is not able to maneuver around the home by herself and therefore she mostly stays upstairs where she is able to get back and forth from the bed to the bathroom.  She notes it is hard to tell where her weight is, though she does note a recent drop of approximately 10 pounds.  Also endorses having increase in nausea and vomiting and has been having some issues with involuntary tremors in her arms as well as feeling cold.  She notes that she has been taking the Megace therapy but has not noticed much benefit from it.  She also endorses some occasional diarrhea.  She is on Eliquis therapy has not had any issues with abnormal bleeding or dark stools.  She notes that her stools are mostly greenish in color.  Fortunately she knows that she is not currently experiencing any pain at this time.  A full 10 point ROS is listed below.  MEDICAL HISTORY:  Past Medical History:  Diagnosis Date  . Cancer ([place])    Liver  . Cholangiocarcinoma ([place])   . Metastasis ([place])   . Pericardial effusion   . Sinus tachycardia   . Uterine fibroid     SURGICAL HISTORY: Past Surgical History:  Procedure Laterality Date  . CESAREAN SECTION    . IR REMOVAL TUN ACCESS W/ PORT W/O FL MOD SED  12/15/2019    SOCIAL HISTORY: Social History   Socioeconomic History  . Marital status: Married    Spouse name: Not on file  . Number of children: Not on file  . Years of education: Not on file  .  Highest education level: Not on file  Occupational History  . Occupation: Pharmacist, hospital  Tobacco Use  . Smoking status: Never Smoker  . Smokeless tobacco: Never Used  Substance and Sexual Activity  . Alcohol use: Never  . Drug use: Never  . Sexual activity: Not Currently  Other Topics Concern  . Not on file  Social History Narrative  . Not on file   Social Determinants of Health   Financial Resource Strain:   . Difficulty  of Paying Living Expenses:   Food Insecurity:   . Worried About Charity fundraiser in the Last Year:   . Arboriculturist in the Last Year:   Transportation Needs:   . Film/video editor (Medical):   Marland Kitchen Lack of Transportation (Non-Medical):   Physical Activity:   . Days of Exercise per Week:   . Minutes of Exercise per Session:   Stress:   . Feeling of Stress :   Social Connections:   . Frequency of Communication with Friends and Family:   . Frequency of Social Gatherings with Friends and Family:   . Attends Religious Services:   . Active Member of Clubs or Organizations:   . Attends Archivist Meetings:   Marland Kitchen Marital Status:   Intimate Partner Violence:   . Fear of Current or Ex-Partner:   . Emotionally Abused:   Marland Kitchen Physically Abused:   . Sexually Abused:     FAMILY HISTORY: Family History  Problem Relation Age of Onset  . Breast cancer Mother        21's  . Ovarian cancer Mother   . Lung cancer Mother        26s  . Ovarian cancer Maternal Aunt   . Breast cancer Maternal Grandmother   . Breast cancer Cousin        maternal, dx50+  . Colon cancer Neg Hx   . Esophageal cancer Neg Hx   . Rectal cancer Neg Hx   . Stomach cancer Neg Hx     ALLERGIES:  has No Known Allergies.  MEDICATIONS:  Current Outpatient Medications  Medication Sig Dispense Refill  . Amino Acids-Protein Hydrolys (FEEDING SUPPLEMENT, PRO-STAT SUGAR FREE 64,) LIQD Take 30 mLs by mouth 2 (two) times daily. 887 mL 0  . apixaban (ELIQUIS) 5 MG TABS tablet Take 1 tablet (5 mg total) by mouth 2 (two) times daily. 60 tablet 0  . dexamethasone (DECADRON) 4 MG tablet Take 8 mg by mouth See admin instructions. Take 2 tablets (8 mg)  by mouth with breakfast on days 2 and 3 of each treatment cycle    . glycopyrrolate (ROBINUL) 1 MG tablet Take 1 mg by mouth 3 (three) times daily as needed (drooling).     Marland Kitchen loperamide (IMODIUM) 2 MG capsule Take 1 capsule (2 mg total) by mouth every 6 (six) hours as  needed for diarrhea or loose stools. 30 capsule 0  . LORazepam (ATIVAN) 1 MG tablet Take 1 mg by mouth every 8 (eight) hours as needed for anxiety.     . metoprolol tartrate (LOPRESSOR) 25 MG tablet Take 1 tablet (25 mg total) by mouth 2 (two) times daily. Hold if sbp less than 100 60 tablet 0  . mirtazapine (REMERON) 7.5 MG tablet Take 1 tablet (7.5 mg total) by mouth at bedtime. 14 tablet 2  . nystatin (MYCOSTATIN) 100000 UNIT/ML suspension Take 5 mLs by mouth every 4 (four) hours as needed (thrush).     . ondansetron (ZOFRAN) 8  MG tablet Take 8 mg by mouth every 8 (eight) hours as needed for nausea.     . prochlorperazine (COMPAZINE) 10 MG tablet Take 10 mg by mouth every 8 (eight) hours as needed for nausea or vomiting.     No current facility-administered medications for this visit.    REVIEW OF SYSTEMS:   Constitutional: ( - ) fevers, ( - )  chills , ( - ) night sweats (+) fatigue Eyes: ( - ) blurriness of vision, ( - ) double vision, ( - ) watery eyes Ears, nose, mouth, throat, and face: ( - ) mucositis, ( - ) sore throat Respiratory: ( - ) cough, ( - ) dyspnea, ( - ) wheezes Cardiovascular: ( - ) palpitation, ( - ) chest discomfort, ( - ) lower extremity swelling Gastrointestinal:  ( + ) nausea, ( - ) heartburn, ( - ) change in bowel habits Skin: ( - ) abnormal skin rashes Lymphatics: ( - ) new lymphadenopathy, ( - ) easy bruising Neurological: ( - ) numbness, ( - ) tingling, ( - ) new weaknesses Behavioral/Psych: ( - ) mood change, ( - ) new changes  All other systems were reviewed with the patient and are negative.  PHYSICAL EXAMINATION: ECOG PERFORMANCE STATUS: 3 - Symptomatic, >50% confined to bed  Vitals:   01/03/20 1504  BP: 93/65  Pulse: 91  Resp: 18  Temp: 98.5 F (36.9 C)  SpO2: 100%   Filed Weights   01/03/20 1504  Weight: 203 lb 13.6 oz (92.5 kg)    GENERAL: chronically ill appearing middle aged [place] American female in NAD  SKIN: skin color, texture,  turgor are normal, no rashes or significant lesions EYES: conjunctiva are pink and non-injected, sclera clear LUNGS: clear to auscultation and percussion with normal breathing effort HEART: regular rate & rhythm and no murmurs and no lower extremity edema Musculoskeletal: no cyanosis of digits and no clubbing  PSYCH: alert & oriented x 3, fluent speech NEURO: no focal motor/sensory deficits  LABORATORY DATA:  I have reviewed the data as listed CBC Latest Ref Rng & Units 01/03/2020 12/18/2019 12/17/2019  WBC 4.0 - 10.5 K/uL 13.8(H) 11.9(H) 9.0  Hemoglobin 12.0 - 15.0 g/dL 8.1(L) 9.0(L) 9.4(L)  Hematocrit 36.0 - 46.0 % 26.7(L) 28.3(L) 29.8(L)  Platelets 150 - 400 K/uL 235 202 183    CMP Latest Ref Rng & Units 01/03/2020 12/18/2019 12/17/2019  Glucose 70 - 99 mg/dL 117(H) 88 92  BUN 6 - 20 mg/dL 14 24(H) 23(H)  Creatinine 0.44 - 1.00 mg/dL 1.58(H) 1.41(H) 1.17(H)  Sodium 135 - 145 mmol/L 142 137 136  Potassium 3.5 - 5.1 mmol/L 3.5 3.5 3.7  Chloride 98 - 111 mmol/L 111 109 107  CO2 22 - 32 mmol/L 20(L) 20(L) 20(L)  Calcium 8.9 - 10.3 mg/dL 6.4(LL) 6.5(L) 6.5(L)  Total Protein 6.5 - 8.1 g/dL 6.2(L) - 5.4(L)  Total Bilirubin 0.3 - 1.2 mg/dL 0.8 - 1.0  Alkaline Phos 38 - 126 U/L 353(H) - 180(H)  AST 15 - 41 U/L 39 - 25  ALT 0 - 44 U/L 14 - 15    RADIOGRAPHIC STUDIES:  IR REMOVAL TUN ACCESS W/ PORT W/O FL MOD SED  Result Date: 12/15/2019 INDICATION: 54 year old female with a history of right port catheter infection referred for removal EXAM: REMOVAL RIGHT IJ VEIN PORT-A-CATH MEDICATIONS: 2 g Ancef; The antibiotic was administered within an appropriate time interval prior to skin puncture. ANESTHESIA/SEDATION: Moderate (conscious) sedation was employed during this procedure. A total  of Versed 2.0 mg and Fentanyl 100 mcg was administered intravenously. Moderate Sedation Time: 25 minutes. The patient's level of consciousness and vital signs were monitored continuously by radiology nursing  throughout the procedure under my direct supervision. FLUOROSCOPY TIME:  None COMPLICATIONS: None PROCEDURE: Informed consent was obtained from the patient following an explanation of the procedure, risks, benefits and alternatives. The patient understands, agrees and consents for the procedure. All questions were addressed. A time out was performed prior to the initiation of the procedure. The patient was positioned in the operation suite in the supine position on a gantry. Visual inspection demonstrates no redness at the site with no ballotable fluid collection. No purulence. The previous scar on the right chest was generously infiltrated with 1% lidocaine for local anesthesia. Infiltration of the skin and subcutaneous tissues surrounding the port was performed. Using sharp and blunt dissection, the double-lumen port apparatus and subcutaneous catheter were removed in their entirety. No purulence within the pocket which was in good repair. Generous irrigation was performed. The port pocket was then closed with interrupted Vicryl layer and a running subcuticular with 4-0 Monocryl. The skin was sealed with Derma bond. A sterile dressing was placed. Tip culture was sent. The patient tolerated the procedure well and remained hemodynamically stable throughout. No complications were encountered and no significant blood loss was encountered. IMPRESSION: Status post right IJ port catheter removal. Signed, Dulcy Fanny. Dellia Nims, RPVI [facility] Radiology Electronically Signed   By: Corrie Mckusick D.O.   On: 12/15/2019 16:36   DG CHEST PORT 1 VIEW  Result Date: 12/17/2019 CLINICAL DATA:  Cough EXAM: PORTABLE CHEST 1 VIEW COMPARISON:  12/10/2019 FINDINGS: The heart size and mediastinal contours are within normal limits. Unchanged elevation of the right hemidiaphragm with associated atelectasis or consolidation. The visualized skeletal structures are unremarkable. IMPRESSION:  Unchanged elevation of the right hemidiaphragm with associated atelectasis or consolidation. No acute abnormality of the lungs in AP portable projection. Electronically Signed   By: Eddie Candle M.D.   On: 12/17/2019 13:35   DG Chest Port 1 View  Result Date: 12/10/2019 CLINICAL DATA:  54 year old female with weakness. EXAM: PORTABLE CHEST 1 VIEW COMPARISON:  Chest radiograph dated 10/25/2019. FINDINGS: Right-sided Port-A-Cath with tip at the cavoatrial junction. There is no focal consolidation, pleural effusion, pneumothorax. Mild cardiomegaly. Atherosclerotic calcification of the aortic arch. No acute osseous pathology. IMPRESSION: 1. No acute cardiopulmonary process. 2. Mild cardiomegaly. Electronically Signed   By: Anner Crete M.D.   On: 12/10/2019 16:21   DG Abd Portable 1V  Result Date: 12/11/2019 CLINICAL DATA:  Nausea weakness in emesis EXAM: PORTABLE ABDOMEN - 1 VIEW COMPARISON:  CT study 07/30/2019 FINDINGS: Centralized mildly dilated loops of small bowel. No visible colonic or rectal gas. LEFT flank excluded from view. Calcified fibroid in the LEFT lower quadrant. No acute bone process. IMPRESSION: Centralized mildly dilated loops of small bowel. No visible colonic or rectal gas. Findings may reflect ileus versus early or small bowel obstruction. Electronically Signed   By: Zetta Bills M.D.   On: 12/11/2019 14:46   ECHOCARDIOGRAM COMPLETE  Result Date: 12/13/2019    ECHOCARDIOGRAM REPORT   Patient Name:   KORINNA BUMGARDNER Date of Exam: 12/13/2019 Medical Rec #:  [id number]       Height:       66.0 in Accession #:    [id number]      Weight:       198.9 lb Date of Birth:  03/08/1966      BSA:          1.995 m Patient Age:    89 years        BP:           112/68 mmHg Patient Gender: F               HR:           100 bpm. Exam Location:  Inpatient Procedure: 2D Echo and Intracardiac Opacification Agent Indications:    Corynebacteria Bacteremia 790.7 / R78.81  History:        Patient has  prior history of Echocardiogram examinations, most                 recent 10/26/2019. Stroke. Metastatic intrahepatic                 cholangiocarcinoma, Pericardial effusion, Sinus Tachycardia.  Sonographer:    Darlina Sicilian RDCS Referring Phys: [id number] CORNELIUS N VAN DAM  Sonographer Comments: Suboptimal apical window. IMPRESSIONS  1. Left ventricular ejection fraction, by estimation, is 55 to 60%. The left ventricle has normal function. The left ventricle has no regional wall motion abnormalities. There is mild left ventricular hypertrophy. Indeterminate diastolic filling due to E-A fusion.  2. Right ventricular systolic function is normal. The right ventricular size is normal. Tricuspid regurgitation signal is inadequate for assessing PA pressure.  3. Left atrial size was mildly dilated.  4. Moderate to large pericardial effusion. The pericardial effusion is anterior to the right ventricle and surrounding the apex. There is no evidence of cardiac tamponade. No RV diastolic collapse, IVC appears small and collapsible. Respirometer evaluation not performed.  5. The mitral valve is normal in structure. No evidence of mitral valve regurgitation. No evidence of mitral stenosis.  6. The aortic valve is tricuspid. Aortic valve regurgitation is mild. No aortic stenosis is present.  7. The inferior vena cava is normal in size with greater than 50% respiratory variability, suggesting right atrial pressure of 3 mmHg. Conclusion(s)/Recommendation(s): No evidence of valvular vegetations on this transthoracic echocardiogram. Would recommend a transesophageal echocardiogram to exclude infective endocarditis if clinically indicated. FINDINGS  Left Ventricle: Left ventricular ejection fraction, by estimation, is 55 to 60%. The left ventricle has normal function. The left ventricle has no regional wall motion abnormalities. Definity contrast agent was given IV to delineate the left ventricular  endocardial borders. The left  ventricular internal cavity size was normal in size. There is mild left ventricular hypertrophy. Indeterminate diastolic filling due to E-A fusion. Right Ventricle: The right ventricular size is normal. No increase in right ventricular wall thickness. Right ventricular systolic function is normal. Tricuspid regurgitation signal is inadequate for assessing PA pressure. Left Atrium: Left atrial size was mildly dilated. Right Atrium: Right atrial size was normal in size. Pericardium: Moderate to large pericardial effusion. The pericardial effusion is anterior to the right ventricle and surrounding the apex. There is no evidence of cardiac tamponade. Mitral Valve: The mitral valve is normal in structure. Normal mobility of the mitral valve leaflets. No evidence of mitral valve regurgitation. No evidence of mitral valve stenosis. Tricuspid Valve: The tricuspid valve is normal in structure. Tricuspid valve regurgitation is not demonstrated. No evidence of tricuspid stenosis. Aortic Valve: The aortic valve is tricuspid. Aortic valve regurgitation is mild. No aortic stenosis is present. Pulmonic Valve: The pulmonic valve was normal in structure. Pulmonic valve regurgitation is not visualized. No evidence of pulmonic stenosis. Aorta: The aortic root is  normal in size and structure. Venous: The inferior vena cava is normal in size with greater than 50% respiratory variability, suggesting right atrial pressure of 3 mmHg. IAS/Shunts: No atrial level shunt detected by color flow Doppler.  LEFT VENTRICLE PLAX 2D LVIDd:         3.40 cm     Diastology LVIDs:         2.40 cm     LV e' lateral:   4.46 cm/s LV PW:         1.00 cm     LV E/e' lateral: 12.5 LV IVS:        1.10 cm     LV e' medial:    5.22 cm/s LVOT diam:     2.00 cm     LV E/e' medial:  10.7 LV SV:         34 LV SV Index:   17 LVOT Area:     3.14 cm  LV Volumes (MOD) LV vol d, MOD A2C: 78.6 ml LV vol d, MOD A4C: 93.2 ml LV vol s, MOD A2C: 38.6 ml LV vol s, MOD A4C:  50.6 ml LV SV MOD A2C:     40.0 ml LV SV MOD A4C:     93.2 ml LV SV MOD BP:      40.3 ml RIGHT VENTRICLE RV S prime:     27.60 cm/s LEFT ATRIUM           Index       RIGHT ATRIUM          Index LA diam:      2.60 cm 1.30 cm/m  RA Area:     9.78 cm LA Vol (A2C): 28.5 ml 14.29 ml/m RA Volume:   17.25 ml 8.65 ml/m LA Vol (A4C): 74.1 ml 37.14 ml/m  AORTIC VALVE LVOT Vmax:   75.00 cm/s LVOT Vmean:  54.700 cm/s LVOT VTI:    0.108 m  AORTA Ao Root diam: 3.00 cm MITRAL VALVE MV Area (PHT): 6.43 cm    SHUNTS MV Decel Time: 118 msec    Systemic VTI:  0.11 m MV E velocity: 55.60 cm/s  Systemic Diam: 2.00 cm MV A velocity: 98.20 cm/s MV E/A ratio:  0.57 Cherlynn Kaiser MD Electronically signed by Cherlynn Kaiser MD Signature Date/Time: 12/13/2019/7:29:39 PM    Final    VAS [place] LOWER EXTREMITY VENOUS (DVT)  Result Date: 12/18/2019  Lower Venous DVTStudy Indications: Edema.  Risk Factors: None identified. Limitations: Body habitus and poor ultrasound/tissue interface. Comparison Study: No prior studies. Performing Technologist: Oliver Hum RVT  Examination Guidelines: A complete evaluation includes B-mode imaging, spectral Doppler, color Doppler, and power Doppler as needed of all accessible portions of each vessel. Bilateral testing is considered an integral part of a complete examination. Limited examinations for reoccurring indications may be performed as noted. The reflux portion of the exam is performed with the patient in reverse Trendelenburg.  +-----+---------------+---------+-----------+----------+--------------+ RIGHTCompressibilityPhasicitySpontaneityPropertiesThrombus Aging +-----+---------------+---------+-----------+----------+--------------+ CFV  Full           Yes      Yes                                 +-----+---------------+---------+-----------+----------+--------------+   +---------+---------------+---------+-----------+----------+--------------+ LEFT      CompressibilityPhasicitySpontaneityPropertiesThrombus Aging +---------+---------------+---------+-----------+----------+--------------+ CFV      Full           Yes      Yes                                 +---------+---------------+---------+-----------+----------+--------------+  SFJ      Full                                                        +---------+---------------+---------+-----------+----------+--------------+ FV Prox  Full                                                        +---------+---------------+---------+-----------+----------+--------------+ FV Mid   Full                                                        +---------+---------------+---------+-----------+----------+--------------+ FV DistalFull                                                        +---------+---------------+---------+-----------+----------+--------------+ PFV      Full                                                        +---------+---------------+---------+-----------+----------+--------------+ POP      Full           Yes      Yes                                 +---------+---------------+---------+-----------+----------+--------------+ PTV      Full                                                        +---------+---------------+---------+-----------+----------+--------------+ PERO     Full                                                        +---------+---------------+---------+-----------+----------+--------------+     Summary: RIGHT: - No evidence of common femoral vein obstruction.  LEFT: - There is no evidence of deep vein thrombosis in the lower extremity. However, portions of this examination were limited- see technologist comments above.  - No cystic structure found in the popliteal fossa.  *See table(s) above for measurements and observations. Electronically signed by Monica Martinez MD on 12/18/2019 at 12:57:24 PM.    Final    [place] EKG  SITE RITE  Result Date: 12/17/2019 If Site Rite image not attached, placement could not be confirmed due to current cardiac rhythm.   ASSESSMENT & PLAN CAROLIE COCKAYNE 54 y.o. female with  medical history significant for metastatic cholangiocarcinoma presents for a follow up visit.  After review of the labs, review the outside records, and discussion with the patient her findings most consistent with metastatic cholangiocarcinoma.  At this time it is unclear how she is responded to her initial round of gemcitabine cisplatin therapy as she has not received restaging scans.  The last scans we have on record are from December 2020.  As such I would recommend that before proceeding with treatment we obtain a new restaging scans with a CT chest abdomen and pelvis.  Once review the scans we can determine the treatment course moving forward.  Event that the patient is responding well to therapy I think we can consider gem cis therapy at a reduced dose given her poor overall functional status.  In the event the progression of tumor is seen I do think that palliative care is most appropriate and I do not recommend any further treatment with palliative chemotherapy.  At this time the patient is reluctant to consider comfort based care or hospice given her young age and strong faith.  We will have the patient return to clinic once she has been cleared and evaluated by infectious disease and additionally once the scans have returned.  #Metastatic Intrahepatic Cholangiocarcinoma, Stage IV --patient is currently recovering from an episode of sepsis with bacteremia. Her blood counts are poor and her nutritional status has declined. Additionally she has become deconditioned since our first meeting in clinic --patient has completed 2 cycles of gem/cis, but only received Day 1 of Cycle 3.  --recommend restaging scans to assure the therapy is working. If patient is having reduction in size of tumor can consider additional  cycles. If progression is noted I would recommend consideration of hospice care --patient is hesitant to consider hospice, despite her marked decline. She continues to struggle with her diagnosis and prognosis --RTC following return of her scans to discuss treatment options moving forward  #Poor Appetite #Weight Loss -- d/c megace given her history of CVA --transition to mirtazepine 7.5mg  nightly   #Nausea --continue zofran 8mg  q8H and compazine 10mg  q6H PRN   #Pain Control --patient currently in 0/10 pain --continue to monitor   Orders Placed This Encounter  Procedures  . CT Chest W Contrast    Standing Status:   Future    Standing Expiration Date:   01/04/2021    Order Specific Question:   If indicated for the ordered procedure, I authorize the administration of contrast media per Radiology protocol    Answer:   Yes    Order Specific Question:   Is patient pregnant?    Answer:   No    Order Specific Question:   Preferred imaging location?    Answer:   [facility]    Order Specific Question:   Radiology Contrast Protocol - do NOT remove file path    Answer:   \\[email or web address]\CTProtocols.pdf  . CT Abdomen Pelvis W Contrast    Standing Status:   Future    Standing Expiration Date:   01/04/2021    Order Specific Question:   If indicated for the ordered procedure, I authorize the administration of contrast media per Radiology protocol    Answer:   Yes    Order Specific Question:   Is patient pregnant?    Answer:   No    Order Specific Question:   Preferred imaging location?    Answer:   [facility]    Order Specific Question:  Is Oral Contrast requested for this exam?    Answer:   Yes, Per Radiology protocol    Order Specific Question:   Radiology Contrast Protocol - do NOT remove file path    Answer:   \\[email or web address]\[email or web address]\CTProtocols.pdf    All questions were answered. The patient knows to call the clinic with any problems, questions or  concerns.  A total of more than 40 minutes were spent on this encounter and over half of that time was spent on counseling and coordination of care as outlined above.   Ledell Peoples, MD Department of Hematology/Oncology [place] at [facility] Phone: [phone] Pager: [phone] Email: [email or web address]  01/06/2020 4:12 PM

## 2020-01-04 MED ORDER — MIRTAZAPINE 7.5 MG PO TABS
7.5000 mg | ORAL_TABLET | Freq: Every day | ORAL | 2 refills | Status: AC
Start: 2020-01-04 — End: ?

## 2020-01-05 ENCOUNTER — Other Ambulatory Visit: Payer: Self-pay | Admitting: *Deleted

## 2020-01-06 ENCOUNTER — Encounter: Payer: Self-pay | Admitting: Hematology and Oncology

## 2020-01-06 ENCOUNTER — Other Ambulatory Visit: Payer: Self-pay | Admitting: *Deleted

## 2020-01-06 MED ORDER — APIXABAN 5 MG PO TABS: 5.0000 mg | ORAL_TABLET | Freq: Two times a day (BID) | ORAL | 0 refills | Status: AC

## 2020-01-06 NOTE — Telephone Encounter (Signed)
Received call from pt's husband, Melissa Hurley. He is requesting a refill on pt's eliquis to [facility] on [address]. Advised that this would be called in today.  Asked Melissa Hurley how Melissa Hurley was feeling. He stated she was about the same-very tired. Advised that I spoke with their home health nurse, Beth from [facility] [place] yesterday and that I told her about their request for home Physical Therapy.  Melissa Hurley voiced understanding.  Currently waiting on insurance authorization for pt's scans. Advised that central Radiology Scheduling would be in contact with them to schedule.  Melissa Hurley voiced understanding.  No further questions at this time.

## 2020-01-07 ENCOUNTER — Telehealth: Payer: Self-pay | Admitting: Infectious Diseases

## 2020-01-07 ENCOUNTER — Telehealth: Payer: Self-pay | Admitting: *Deleted

## 2020-01-07 NOTE — Telephone Encounter (Signed)
Notified by Jeani Hawking with [facility] yesterday that Ms. Moroney has completed her IV antibiotics and no longer has coverage for PICC maintenance through insurance - I looked at Oncology's notes to see what chemotherapy plan is going to be since we had to take her port out, and it appears she is awaiting further staging imaging and she is not feeling well.   Will go ahead and D/C PICC line given active malignancy and thrombosis/infection risk.   She has follow up with Dr. Tommy Medal scheduled next week for further assessment.

## 2020-01-07 NOTE — Telephone Encounter (Signed)
Received message from on call service from last evening. They received call from pt's [facility] nurse stating patient's BP was low yesterday @ 88/64 and was c/o chest tightness. The recommendation was for pt to go to ED.  Reviewed pt's chart this am and no notes from ED. TCT pt's husband, Melissa Hurley.  Spoke with him.  He stated that pt started feeling somewhat better last night and repeat BP was 120/80. He states that she has not c/o further chest tightness.  [name] does not think that she needs to be seen here today.  She has an appt with Infectious Disease on 5/26/21and an appt with Cardiology on 01/14/20  Advised that we are waiting on complete authorization of scans in order to schedule them next week as well.  [name] voiced understanding and appreciation of phone call. He verbalizes understanding to call here with any questions or concerns

## 2020-01-12 ENCOUNTER — Telehealth: Payer: Self-pay | Admitting: *Deleted

## 2020-01-12 ENCOUNTER — Encounter: Payer: Self-pay | Admitting: Infectious Disease

## 2020-01-12 ENCOUNTER — Inpatient Hospital Stay (HOSPITAL_COMMUNITY): Payer: BC Managed Care – PPO

## 2020-01-12 ENCOUNTER — Encounter (HOSPITAL_COMMUNITY): Payer: Self-pay | Admitting: Emergency Medicine

## 2020-01-12 ENCOUNTER — Other Ambulatory Visit: Payer: Self-pay

## 2020-01-12 ENCOUNTER — Telehealth: Payer: Self-pay

## 2020-01-12 ENCOUNTER — Inpatient Hospital Stay (HOSPITAL_COMMUNITY)
Admission: EM | Admit: 2020-01-12 | Discharge: 2020-02-17 | DRG: 435 | Disposition: E | Payer: BC Managed Care – PPO | Attending: Internal Medicine | Admitting: Internal Medicine

## 2020-01-12 ENCOUNTER — Ambulatory Visit: Payer: BC Managed Care – PPO | Admitting: Infectious Disease

## 2020-01-12 ENCOUNTER — Emergency Department (HOSPITAL_COMMUNITY): Payer: BC Managed Care – PPO

## 2020-01-12 VITALS — BP 80/40 | Temp 98.1°F

## 2020-01-12 DIAGNOSIS — C7951 Secondary malignant neoplasm of bone: Secondary | ICD-10-CM

## 2020-01-12 DIAGNOSIS — R188 Other ascites: Secondary | ICD-10-CM | POA: Diagnosis present

## 2020-01-12 DIAGNOSIS — D649 Anemia, unspecified: Secondary | ICD-10-CM

## 2020-01-12 DIAGNOSIS — G9341 Metabolic encephalopathy: Secondary | ICD-10-CM | POA: Diagnosis not present

## 2020-01-12 DIAGNOSIS — T80211D Bloodstream infection due to central venous catheter, subsequent encounter: Secondary | ICD-10-CM

## 2020-01-12 DIAGNOSIS — R627 Adult failure to thrive: Secondary | ICD-10-CM | POA: Diagnosis present

## 2020-01-12 DIAGNOSIS — C221 Intrahepatic bile duct carcinoma: Secondary | ICD-10-CM

## 2020-01-12 DIAGNOSIS — E43 Unspecified severe protein-calorie malnutrition: Secondary | ICD-10-CM | POA: Diagnosis present

## 2020-01-12 DIAGNOSIS — R7881 Bacteremia: Secondary | ICD-10-CM

## 2020-01-12 DIAGNOSIS — R601 Generalized edema: Secondary | ICD-10-CM | POA: Diagnosis not present

## 2020-01-12 DIAGNOSIS — F411 Generalized anxiety disorder: Secondary | ICD-10-CM | POA: Diagnosis not present

## 2020-01-12 DIAGNOSIS — R1310 Dysphagia, unspecified: Secondary | ICD-10-CM | POA: Diagnosis present

## 2020-01-12 DIAGNOSIS — R451 Restlessness and agitation: Secondary | ICD-10-CM

## 2020-01-12 DIAGNOSIS — Z515 Encounter for palliative care: Secondary | ICD-10-CM

## 2020-01-12 DIAGNOSIS — R410 Disorientation, unspecified: Secondary | ICD-10-CM

## 2020-01-12 DIAGNOSIS — I959 Hypotension, unspecified: Secondary | ICD-10-CM

## 2020-01-12 DIAGNOSIS — R1110 Vomiting, unspecified: Secondary | ICD-10-CM | POA: Diagnosis not present

## 2020-01-12 DIAGNOSIS — K7290 Hepatic failure, unspecified without coma: Secondary | ICD-10-CM | POA: Diagnosis not present

## 2020-01-12 DIAGNOSIS — F05 Delirium due to known physiological condition: Secondary | ICD-10-CM | POA: Diagnosis not present

## 2020-01-12 DIAGNOSIS — I611 Nontraumatic intracerebral hemorrhage in hemisphere, cortical: Secondary | ICD-10-CM | POA: Diagnosis not present

## 2020-01-12 DIAGNOSIS — Z66 Do not resuscitate: Secondary | ICD-10-CM | POA: Diagnosis not present

## 2020-01-12 DIAGNOSIS — R110 Nausea: Secondary | ICD-10-CM | POA: Diagnosis not present

## 2020-01-12 DIAGNOSIS — Z7189 Other specified counseling: Secondary | ICD-10-CM | POA: Diagnosis not present

## 2020-01-12 DIAGNOSIS — R791 Abnormal coagulation profile: Secondary | ICD-10-CM | POA: Diagnosis present

## 2020-01-12 DIAGNOSIS — Z452 Encounter for adjustment and management of vascular access device: Secondary | ICD-10-CM

## 2020-01-12 DIAGNOSIS — C7801 Secondary malignant neoplasm of right lung: Secondary | ICD-10-CM | POA: Diagnosis present

## 2020-01-12 DIAGNOSIS — T80211A Bloodstream infection due to central venous catheter, initial encounter: Secondary | ICD-10-CM

## 2020-01-12 DIAGNOSIS — E876 Hypokalemia: Secondary | ICD-10-CM | POA: Diagnosis present

## 2020-01-12 DIAGNOSIS — C787 Secondary malignant neoplasm of liver and intrahepatic bile duct: Secondary | ICD-10-CM | POA: Diagnosis present

## 2020-01-12 DIAGNOSIS — C7802 Secondary malignant neoplasm of left lung: Secondary | ICD-10-CM | POA: Diagnosis present

## 2020-01-12 DIAGNOSIS — R918 Other nonspecific abnormal finding of lung field: Secondary | ICD-10-CM | POA: Diagnosis present

## 2020-01-12 DIAGNOSIS — Z20822 Contact with and (suspected) exposure to covid-19: Secondary | ICD-10-CM | POA: Diagnosis present

## 2020-01-12 DIAGNOSIS — R310 Gross hematuria: Secondary | ICD-10-CM | POA: Diagnosis present

## 2020-01-12 DIAGNOSIS — E861 Hypovolemia: Secondary | ICD-10-CM | POA: Diagnosis present

## 2020-01-12 DIAGNOSIS — I619 Nontraumatic intracerebral hemorrhage, unspecified: Secondary | ICD-10-CM | POA: Diagnosis present

## 2020-01-12 DIAGNOSIS — I629 Nontraumatic intracranial hemorrhage, unspecified: Secondary | ICD-10-CM

## 2020-01-12 DIAGNOSIS — C7952 Secondary malignant neoplasm of bone marrow: Secondary | ICD-10-CM | POA: Diagnosis present

## 2020-01-12 DIAGNOSIS — R531 Weakness: Secondary | ICD-10-CM

## 2020-01-12 DIAGNOSIS — I9589 Other hypotension: Secondary | ICD-10-CM | POA: Diagnosis present

## 2020-01-12 DIAGNOSIS — E669 Obesity, unspecified: Secondary | ICD-10-CM | POA: Diagnosis present

## 2020-01-12 DIAGNOSIS — T80212D Local infection due to central venous catheter, subsequent encounter: Secondary | ICD-10-CM | POA: Diagnosis not present

## 2020-01-12 DIAGNOSIS — Z6832 Body mass index (BMI) 32.0-32.9, adult: Secondary | ICD-10-CM

## 2020-01-12 DIAGNOSIS — R54 Age-related physical debility: Secondary | ICD-10-CM | POA: Diagnosis present

## 2020-01-12 DIAGNOSIS — D6959 Other secondary thrombocytopenia: Secondary | ICD-10-CM | POA: Diagnosis present

## 2020-01-12 DIAGNOSIS — R197 Diarrhea, unspecified: Secondary | ICD-10-CM | POA: Diagnosis not present

## 2020-01-12 DIAGNOSIS — D630 Anemia in neoplastic disease: Secondary | ICD-10-CM | POA: Diagnosis present

## 2020-01-12 DIAGNOSIS — Z7901 Long term (current) use of anticoagulants: Secondary | ICD-10-CM | POA: Diagnosis not present

## 2020-01-12 DIAGNOSIS — R400 Somnolence: Secondary | ICD-10-CM

## 2020-01-12 DIAGNOSIS — C799 Secondary malignant neoplasm of unspecified site: Secondary | ICD-10-CM | POA: Diagnosis present

## 2020-01-12 DIAGNOSIS — Z8673 Personal history of transient ischemic attack (TIA), and cerebral infarction without residual deficits: Secondary | ICD-10-CM

## 2020-01-12 DIAGNOSIS — Z79899 Other long term (current) drug therapy: Secondary | ICD-10-CM

## 2020-01-12 HISTORY — DX: Bloodstream infection due to central venous catheter, initial encounter: T80.211A

## 2020-01-12 LAB — URINALYSIS, ROUTINE W REFLEX MICROSCOPIC
Bilirubin Urine: NEGATIVE
Glucose, UA: NEGATIVE mg/dL
Hgb urine dipstick: NEGATIVE
Ketones, ur: NEGATIVE mg/dL
Leukocytes,Ua: NEGATIVE
Nitrite: NEGATIVE
Protein, ur: 100 mg/dL — AB
Specific Gravity, Urine: 1.017 (ref 1.005–1.030)
pH: 6 (ref 5.0–8.0)

## 2020-01-12 LAB — CBC WITH DIFFERENTIAL/PLATELET
Abs Immature Granulocytes: 0.08 10*3/uL — ABNORMAL HIGH (ref 0.00–0.07)
Basophils Absolute: 0.1 10*3/uL (ref 0.0–0.1)
Basophils Relative: 1 %
Eosinophils Absolute: 0.1 10*3/uL (ref 0.0–0.5)
Eosinophils Relative: 1 %
HCT: 23.6 % — ABNORMAL LOW (ref 36.0–46.0)
Hemoglobin: 6.9 g/dL — CL (ref 12.0–15.0)
Immature Granulocytes: 1 %
Lymphocytes Relative: 19 %
Lymphs Abs: 2.1 10*3/uL (ref 0.7–4.0)
MCH: 25.7 pg — ABNORMAL LOW (ref 26.0–34.0)
MCHC: 29.2 g/dL — ABNORMAL LOW (ref 30.0–36.0)
MCV: 87.7 fL (ref 80.0–100.0)
Monocytes Absolute: 1 10*3/uL (ref 0.1–1.0)
Monocytes Relative: 9 %
Neutro Abs: 7.9 10*3/uL — ABNORMAL HIGH (ref 1.7–7.7)
Neutrophils Relative %: 69 %
Platelets: 138 10*3/uL — ABNORMAL LOW (ref 150–400)
RBC: 2.69 MIL/uL — ABNORMAL LOW (ref 3.87–5.11)
RDW: 18.9 % — ABNORMAL HIGH (ref 11.5–15.5)
WBC: 11.2 10*3/uL — ABNORMAL HIGH (ref 4.0–10.5)
nRBC: 0 % (ref 0.0–0.2)

## 2020-01-12 LAB — COMPREHENSIVE METABOLIC PANEL
ALT: 16 U/L (ref 0–44)
AST: 33 U/L (ref 15–41)
Albumin: 1.9 g/dL — ABNORMAL LOW (ref 3.5–5.0)
Alkaline Phosphatase: 246 U/L — ABNORMAL HIGH (ref 38–126)
Anion gap: 9 (ref 5–15)
BUN: 16 mg/dL (ref 6–20)
CO2: 22 mmol/L (ref 22–32)
Calcium: 6.3 mg/dL — CL (ref 8.9–10.3)
Chloride: 110 mmol/L (ref 98–111)
Creatinine, Ser: 1.28 mg/dL — ABNORMAL HIGH (ref 0.44–1.00)
GFR calc Af Amer: 56 mL/min — ABNORMAL LOW (ref >60)
GFR calc non Af Amer: 48 mL/min — ABNORMAL LOW (ref >60)
Glucose, Bld: 101 mg/dL — ABNORMAL HIGH (ref 70–99)
Potassium: 3.3 mmol/L — ABNORMAL LOW (ref 3.5–5.1)
Sodium: 141 mmol/L (ref 135–145)
Total Bilirubin: 1.3 mg/dL — ABNORMAL HIGH (ref 0.3–1.2)
Total Protein: 5.7 g/dL — ABNORMAL LOW (ref 6.5–8.1)

## 2020-01-12 LAB — MRSA PCR SCREENING: MRSA by PCR: NEGATIVE

## 2020-01-12 LAB — LACTIC ACID, PLASMA
Lactic Acid, Venous: 3 mmol/L (ref 0.5–1.9)
Lactic Acid, Venous: 3.5 mmol/L (ref 0.5–1.9)

## 2020-01-12 LAB — PROTIME-INR
INR: 3 — ABNORMAL HIGH (ref 0.8–1.2)
Prothrombin Time: 29.9 seconds — ABNORMAL HIGH (ref 11.4–15.2)

## 2020-01-12 LAB — HEMOGLOBIN AND HEMATOCRIT, BLOOD
HCT: 27.3 % — ABNORMAL LOW (ref 36.0–46.0)
Hemoglobin: 8.2 g/dL — ABNORMAL LOW (ref 12.0–15.0)

## 2020-01-12 LAB — MAGNESIUM: Magnesium: 1.1 mg/dL — ABNORMAL LOW (ref 1.7–2.4)

## 2020-01-12 LAB — SARS CORONAVIRUS 2 BY RT PCR (HOSPITAL ORDER, PERFORMED IN ~~LOC~~ HOSPITAL LAB): SARS Coronavirus 2: NEGATIVE

## 2020-01-12 LAB — AMMONIA: Ammonia: 25 umol/L (ref 9–35)

## 2020-01-12 LAB — TYPE AND SCREEN
ABO/RH(D): O POS
Antibody Screen: NEGATIVE

## 2020-01-12 LAB — ABO/RH: ABO/RH(D): O POS

## 2020-01-12 LAB — APTT: aPTT: 54 seconds — ABNORMAL HIGH (ref 24–36)

## 2020-01-12 LAB — I-STAT BETA HCG BLOOD, ED (MC, WL, AP ONLY): I-stat hCG, quantitative: <5 m[IU]/mL (ref <5)

## 2020-01-12 LAB — PREPARE RBC (CROSSMATCH)

## 2020-01-12 MED ORDER — ONDANSETRON HCL 4 MG/2ML IJ SOLN
4.0000 mg | Freq: Four times a day (QID) | INTRAMUSCULAR | Status: DC | PRN
  Administered 2020-01-13 – 2020-01-15 (×5): 4 mg via INTRAVENOUS
  Filled 2020-01-12 (×5): qty 2

## 2020-01-12 MED ORDER — LACTATED RINGERS IV BOLUS (SEPSIS)
1000.0000 mL | Freq: Once | INTRAVENOUS | Status: AC
  Administered 2020-01-12: 1000 mL via INTRAVENOUS

## 2020-01-12 MED ORDER — NICARDIPINE HCL IN NACL 20-0.86 MG/200ML-% IV SOLN: 0.0000 mg/h | INTRAVENOUS | Status: DC

## 2020-01-12 MED ORDER — VANCOMYCIN HCL 2000 MG/400ML IV SOLN
2000.0000 mg | INTRAVENOUS | Status: AC
  Administered 2020-01-12: 2000 mg via INTRAVENOUS
  Filled 2020-01-12: qty 400

## 2020-01-12 MED ORDER — CHLORHEXIDINE GLUCONATE CLOTH 2 % EX PADS
6.0000 | MEDICATED_PAD | Freq: Every day | CUTANEOUS | Status: DC
  Administered 2020-01-12 – 2020-01-16 (×5): 6 via TOPICAL

## 2020-01-12 MED ORDER — STROKE: EARLY STAGES OF RECOVERY BOOK
Freq: Once | Status: AC
  Filled 2020-01-12: qty 1

## 2020-01-12 MED ORDER — ACETAMINOPHEN 325 MG PO TABS: 650.0000 mg | ORAL_TABLET | ORAL | Status: DC | PRN

## 2020-01-12 MED ORDER — ACETAMINOPHEN 650 MG RE SUPP: 650.0000 mg | RECTAL | Status: DC | PRN

## 2020-01-12 MED ORDER — ACETAMINOPHEN 160 MG/5ML PO SOLN: 650.0000 mg | ORAL | Status: DC | PRN

## 2020-01-12 MED ORDER — SENNOSIDES-DOCUSATE SODIUM 8.6-50 MG PO TABS
1.0000 | ORAL_TABLET | Freq: Two times a day (BID) | ORAL | Status: DC
  Administered 2020-01-14: 1 via ORAL
  Filled 2020-01-12 (×6): qty 1

## 2020-01-12 MED ORDER — SODIUM CHLORIDE 0.9% IV SOLUTION: Freq: Once | INTRAVENOUS | Status: AC

## 2020-01-12 MED ORDER — SODIUM CHLORIDE 0.9 % IV SOLN
2.0000 g | Freq: Once | INTRAVENOUS | Status: AC
  Administered 2020-01-12: 2 g via INTRAVENOUS
  Filled 2020-01-12: qty 2

## 2020-01-12 MED ORDER — SODIUM CHLORIDE 0.9 % IV SOLN: INTRAVENOUS | Status: DC

## 2020-01-12 MED ORDER — METRONIDAZOLE IN NACL 5-0.79 MG/ML-% IV SOLN
500.0000 mg | Freq: Once | INTRAVENOUS | Status: AC
  Administered 2020-01-12: 500 mg via INTRAVENOUS
  Filled 2020-01-12: qty 100

## 2020-01-12 MED ORDER — PANTOPRAZOLE SODIUM 40 MG IV SOLR
40.0000 mg | Freq: Every day | INTRAVENOUS | Status: DC
  Administered 2020-01-12 – 2020-01-13 (×2): 40 mg via INTRAVENOUS
  Filled 2020-01-12 (×2): qty 40

## 2020-01-12 MED ORDER — VANCOMYCIN HCL IN DEXTROSE 1-5 GM/200ML-% IV SOLN: 1000.0000 mg | Freq: Once | INTRAVENOUS | Status: DC

## 2020-01-12 NOTE — ED Notes (Signed)
Pt transported to CT ?

## 2020-01-12 NOTE — ED Notes (Signed)
X-ray at bedside

## 2020-01-12 NOTE — Telephone Encounter (Signed)
Patient needed EMS today during visit. Patient came into office with her husband. Was in a wheelchair during visit; looked tired and disconnected during appointment.  Was unable to get blood pressure with dynamat. RN had to get blood pressure manually. Per MD patient was started on IV fluids. Requested EMS be contacted as well for patient to be evaluated at ED. EMS was contacted at 12:10 and arrived around 12:15.MD was available for evaluation while patient waited for EMS. RN Tammy and Dorie were in room with patient starting IV fluids. AED was ready and available if needed.  Patient's husband is aware that patient is going to ED. Will follow ambulance to hospital. Aundria Rud, [place]

## 2020-01-12 NOTE — Sepsis Progress Note (Signed)
Notified bedside nurse of need to draw a 3rd repeat lactic acid. Pt has had appropriate fluid resuscitation, Blood cultures and antibiotics have been given. Pt is not hypotensive.

## 2020-01-12 NOTE — Consult Note (Addendum)
Neurology Consultation Reason for Consult: Fatigue Referring Physician: Delsa Bern, V  CC: Fatigue  History is obtained from: Patient, husband   HPI: Melissa Hurley is a 54 y.o. female with a history of cholangiocarcinoma who suffered  ischemic infarcts felt to be embolic secondary to hypercoagulable state and was started on anticoagulation in March of this year.  Subsequently, she has improved from a neurological standpoint, but has been very tired, frequently spending all day in bed.  This has been a persistent problem since she was admitted with bacteremia and discharged about a month ago.  On presentation to her clinic today, she was found to have a systolic of 0000000 and was sent to the ER for further evaluation.  Her husband states that he checks it at home, and it varies from the 100s to the 35s.  As part of her evaluation, given her recent strokes CT head was performed which was read as hemorrhagic conversion of her previous ischemic infarct.    ROS: A 14 point ROS was performed and is negative except as noted in the HPI.   Past Medical History:  Diagnosis Date  . Bloodstream infection due to Port-A-Cath 01/11/2020  . Cancer ([place])    Liver  . Cholangiocarcinoma ([place])   . Metastasis ([place])   . Pericardial effusion   . Sinus tachycardia   . Uterine fibroid      Family History  Problem Relation Age of Onset  . Breast cancer Mother        57's  . Ovarian cancer Mother   . Lung cancer Mother        44s  . Ovarian cancer Maternal Aunt   . Breast cancer Maternal Grandmother   . Breast cancer Cousin        maternal, dx50+  . Colon cancer Neg Hx   . Esophageal cancer Neg Hx   . Rectal cancer Neg Hx   . Stomach cancer Neg Hx      Social History:  reports that she has never smoked. She has never used smokeless tobacco. She reports that she does not drink alcohol or use drugs.   Exam: Current vital signs: BP 124/90   Pulse 96   Temp 98.6 F (37 C) (Oral)   Resp (!) 26    LMP 10/25/2014   SpO2 100%  Vital signs in last 24 hours: Temp:  [97.5 F (36.4 C)-98.6 F (37 C)] 98.6 F (37 C) (05/26 1639) Pulse Rate:  [79-124] 96 (05/26 1930) Resp:  [14-36] 26 (05/26 1930) BP: (80-162)/(40-128) 124/90 (05/26 1930) SpO2:  [94 %-100 %] 100 % (05/26 1930)   Physical Exam  Constitutional: Appears well-developed and well-nourished.  Psych: Affect appropriate to situation Eyes: No scleral injection HENT: No OP obstrucion MSK: no joint deformities.  Cardiovascular: Normal rate and regular rhythm.  Respiratory: Effort normal, non-labored breathing GI: Soft.  No distension. There is no tenderness.  Skin: WDI  Neuro: Mental Status: Patient is awake, alert, oriented to person, place, month, year, and situation. Patient is able to give a clear and coherent history. No signs of neglect, mildly increased latency of speech, able to repeat no ifs, ands or buts. Cranial Nerves: II: Visual Fields are full. Pupils are equal, round, and reactive to light.   III,IV, VI: EOMI without ptosis or diploplia.  V: Facial sensation is symmetric to temperature VII: Facial movement is symmetric.  VIII: hearing is intact to voice X: Uvula elevates symmetrically XI: Shoulder shrug is symmetric. XII: tongue is  midline without atrophy or fasciculations.  Motor: Tone is normal. Bulk is normal. 5/5 strength was present in bilateral upper extremities, she does not give great effort in her lower extremities, but least 4+/5 strength bilaterally. Sensory: Sensation is symmetric to light touch and temperature in the arms and legs. Cerebellar: Mild tremor on finger-nose-finger seems most consistent with enhanced physiological tremor.   I have reviewed labs in epic and the results pertinent to this consultation are: Calcium corrects to 8.0  I have reviewed the images obtained: CT head-hyperdensity in her previous stroke seems more consistent with laminar necrosis/calcification than  hemorrhage.  Impression: 54 year old female with lethargy in the setting of severe hypotension, I suspect that her energy/somnolence are more related to her general medical state, including hypotension and recent bacteremia and then to a neurological condition.  TSH was normal on 5/1, but would be reasonable to check a.m. cortisol has adrenal insufficiency can cause both lack of energy and hypertension.  Recommendations: 1) a.m. cortisol 2) MRI brain, if this confirms laminar necrosis then can restart anticoagulation and no further neurological recommendations.   Roland Rack, MD [facility] [phone]  If 7pm- 7am, please page neurology on call as listed in [place].

## 2020-01-12 NOTE — ED Notes (Signed)
Main phlebotomy at bedside attempting blood culture collection, unsuccessful.

## 2020-01-12 NOTE — ED Provider Notes (Signed)
[facility] DEPT Provider Note   CSN: [id number] Arrival date & time: 01/03/2020  1230     History Chief Complaint  Patient presents with  . Hypotension  . Fatigue  . Vaginal Bleeding    Melissa Hurley is a 54 y.o. female.  HPI 54 year old female with metastatic hepatic cholangiocarcinoma, pericardial effusion history of sepsis, status post stroke on Eliquis presents today with hypotension.  She reports vaginal bleeding with 1 pad per day for the past 3 days.  She states this is happened once before since she has been on Eliquis.  Today she was seen in the infectious disease clinic and noted to be hypotensive.  She is given 400 cc of saline prior to arrival.  EMS reports her initial blood pressures in the 60s and was in the 80s after the 400 cc.  She denies any pain, injury, chest pain, dyspnea, or abdominal pain.  She reports generalized weakness as well as some episodes of vomiting with last occurrence several days ago and some intermittent episodes of loose stools. During her last hospitalization she had a IV port removed and she was on vancomycin until several weeks ago.  She had a PICC line placed.  She reports no problems with her current PICC line.  During that visit she had a corynebacterium on several cultures from 425.Marland Kitchen  The tip of the Port-A-Cath did not grow out anything.  Repeat blood cultures on 429 did not grow anything. Patient was pancytopenic She had large pericardial effusion without evidence of tamponade.  Cardiology was consulted and recommended serial echoes.  She was to follow-up outpatient with cardiology.  She also had nonsustained V. tach was advised to keep potassium above 4 and mag above 2.  She was started on Lopressor.  From Oncology note Metastatic Intrahepatic Cholangiocarcinoma, Stage IV 1) 07/30/2019: CT scan performed for generalized abdominal pain and bloating. Findings showed multiple hypervascular masses, abdominal  lymphadenopathy, lung nodules, and thickening of the GE junction.  2) 08/04/2019: EGD showed candida infection of the esophagus, small hiatal hernia, with no other concerning findings.Colonoscopy performed with no malignancy or abnormalities noted. 3) 08/06/2019: establish care with Dr. Lorenso Courier 4) 08/17/2019: US guided biopsy of the liver lesion, confirmed adenocarcinoma without clearly denoting the origin. Pancreaticobiliary vs GYN origin.  5) 08/26/2019: underwent endometrial biopsy with Ob/Gyn. No evidence of GYN malignancy.  6)  10/04/2019: started Cycle 1 Day 1 Gem/cis with Dr. Jimmy Footman 7) 11/03/2019: Cycle 2 Day 1 of Gem/cis (delayed due to CVA)  8) 11/24/2019: Cycle 3 Day 1 of Gem/cis 9) 12/16/2019: Reconnected with [place] Team after admission for bacteremia.  10) 01/03/2020: re-establish care with Dr. Lorenso Courier      Past Medical History:  Diagnosis Date  . Bloodstream infection due to Port-A-Cath 12/28/2019  . Cancer ([place])    Liver  . Cholangiocarcinoma ([place])   . Metastasis ([place])   . Pericardial effusion   . Sinus tachycardia   . Uterine fibroid     Patient Active Problem List   Diagnosis Date Noted  . Bloodstream infection due to Port-A-Cath 01/13/2020  . PICC (peripherally inserted central catheter) in place 01/03/2020  . Malnutrition of moderate degree 12/18/2019  . Bacteremia   . Pericardial effusion   . Pancytopenia ([place]) 12/10/2019  . Nausea & vomiting 12/10/2019  . Dehydration 12/10/2019  . AKI (acute kidney injury) ([place]) 12/10/2019  . SIRS (systemic inflammatory response syndrome) ([place]) 12/10/2019  . Metastatic cholangiocarcinoma to bone ([place]) 12/10/2019  .  History of CVA (cerebrovascular accident) 12/10/2019  . Hypokalemia 12/10/2019  . Hypocalcemia 12/10/2019  . Acute CVA (cerebrovascular accident) ([place]) 10/25/2019  . Anemia 10/25/2019  . Fibroid uterus 08/27/2019    Past Surgical History:  Procedure Laterality Date  . CESAREAN SECTION    . IR  REMOVAL TUN ACCESS W/ PORT W/O FL MOD SED  12/15/2019     OB History    Gravida  2   Para  1   Term      Preterm      AB  1   Living        SAB  1   TAB      Ectopic      Multiple      Live Births              Family History  Problem Relation Age of Onset  . Breast cancer Mother        93's  . Ovarian cancer Mother   . Lung cancer Mother        68s  . Ovarian cancer Maternal Aunt   . Breast cancer Maternal Grandmother   . Breast cancer Cousin        maternal, dx50+  . Colon cancer Neg Hx   . Esophageal cancer Neg Hx   . Rectal cancer Neg Hx   . Stomach cancer Neg Hx     Social History   Tobacco Use  . Smoking status: Never Smoker  . Smokeless tobacco: Never Used  Substance Use Topics  . Alcohol use: Never  . Drug use: Never    Home Medications Prior to Admission medications   Medication Sig Start Date End Date Taking? Authorizing Provider  Amino Acids-Protein Hydrolys (FEEDING SUPPLEMENT, PRO-STAT SUGAR FREE 64,) LIQD Take 30 mLs by mouth 2 (two) times daily. 12/18/19   Florencia Reasons, MD  apixaban (ELIQUIS) 5 MG TABS tablet Take 1 tablet (5 mg total) by mouth 2 (two) times daily. 01/06/20   Orson Slick, MD  dexamethasone (DECADRON) 4 MG tablet Take 8 mg by mouth See admin instructions. Take 2 tablets (8 mg)  by mouth with breakfast on days 2 and 3 of each treatment cycle 10/04/19   [provider]  glycopyrrolate (ROBINUL) 1 MG tablet Take 1 mg by mouth 3 (three) times daily as needed (drooling).  12/06/19   [provider]  loperamide (IMODIUM) 2 MG capsule Take 1 capsule (2 mg total) by mouth every 6 (six) hours as needed for diarrhea or loose stools. 12/18/19   Florencia Reasons, MD  LORazepam (ATIVAN) 1 MG tablet Take 1 mg by mouth every 8 (eight) hours as needed for anxiety.  10/12/19   [provider]  metoprolol tartrate (LOPRESSOR) 25 MG tablet Take 1 tablet (25 mg total) by mouth 2 (two) times daily. Hold if sbp less than 100  12/18/19   Florencia Reasons, MD  mirtazapine (REMERON) 7.5 MG tablet Take 1 tablet (7.5 mg total) by mouth at bedtime. 01/04/20   Orson Slick, MD  nystatin (MYCOSTATIN) 100000 UNIT/ML suspension Take 5 mLs by mouth every 4 (four) hours as needed (thrush).  10/14/19   [provider]  ondansetron (ZOFRAN) 8 MG tablet Take 8 mg by mouth every 8 (eight) hours as needed for nausea.  09/20/19   [provider]  prochlorperazine (COMPAZINE) 10 MG tablet Take 10 mg by mouth every 8 (eight) hours as needed for nausea or vomiting.  [provider]    Allergies    Patient has no known allergies.  Review of Systems   Review of Systems  All other systems reviewed and are negative.   Physical Exam Updated Vital Signs BP 129/67 (BP Location: Right Arm)   Pulse 85   Temp 98.3 F (36.8 C) (Oral)   Resp 20   LMP 10/25/2014   SpO2 100%   Physical Exam Vitals and nursing note reviewed.  Constitutional:      General: She is not in acute distress.    Appearance: Normal appearance. She is obese. She is ill-appearing.  HENT:     Head: Normocephalic.     Right Ear: External ear normal.     Left Ear: External ear normal.     Nose: Nose normal.     Mouth/Throat:     Mouth: Mucous membranes are moist.  Eyes:     Extraocular Movements: Extraocular movements intact.     Pupils: Pupils are equal, round, and reactive to light.  Cardiovascular:     Rate and Rhythm: Normal rate and regular rhythm.     Pulses: Normal pulses.     Heart sounds: Normal heart sounds.  Pulmonary:     Effort: Pulmonary effort is normal.     Breath sounds: Normal breath sounds.  Abdominal:     General: Bowel sounds are normal. There is distension.     Palpations: Abdomen is soft.     Tenderness: There is no abdominal tenderness.  Musculoskeletal:        General: Normal range of motion.     Cervical back: Normal range of motion.     Comments: PICC line left upper extremity without tenderness,  erythema, or discharge Site of previous port right chest appears to be healing well with no signs of infection  Skin:    General: Skin is warm.     Capillary Refill: Capillary refill takes less than 2 seconds.  Neurological:     General: No focal deficit present.     Mental Status: She is alert.  Psychiatric:        Mood and Affect: Mood normal.     ED Results / Procedures / Treatments   Labs (all labs ordered are listed, but only abnormal results are displayed) Labs Reviewed  CULTURE, BLOOD (ROUTINE X 2)  CULTURE, BLOOD (ROUTINE X 2)  URINE CULTURE  SARS CORONAVIRUS 2 BY RT PCR (HOSPITAL ORDER, [facility])  LACTIC ACID, PLASMA  LACTIC ACID, PLASMA  COMPREHENSIVE METABOLIC PANEL  CBC WITH DIFFERENTIAL/PLATELET  APTT  PROTIME-INR  URINALYSIS, ROUTINE W REFLEX MICROSCOPIC  AMMONIA  I-STAT BETA HCG BLOOD, ED ([facility], [facility], [facility] ONLY)    EKG EKG Interpretation  Date/Time:  Wednesday Jan 12 2020 12:41:57 EDT Ventricular Rate:  87 PR Interval:    QRS Duration: 71 QT Interval:  387 QTC Calculation: 466 R Axis:   76 Text Interpretation: Normal sinus rhythm Confirmed by Pattricia Boss [phone]) on 01/17/2020 3:16:04 PM   Radiology CT Head Wo Contrast  Result Date: 12/18/2019 CLINICAL DATA:  Altered mental status. EXAM: CT HEAD WITHOUT CONTRAST TECHNIQUE: Contiguous axial images were obtained from the base of the skull through the vertex without intravenous contrast. COMPARISON:  Head CT and brain MR dated 10/25/2019 FINDINGS: Brain: Again demonstrated is low density in a right middle cerebral artery distribution with interval linear high density components measuring up to 88 Hounsfield units in density. No mass effect. Interval small area of  low density in the left frontal white matter in an area of previously restricted diffusion on the MR. Stable mildly enlarged ventricles and cortical sulci. Vascular: No hyperdense vessel or unexpected calcification. Skull:  Normal. Negative for fracture or focal lesion. Sinuses/Orbits: Unremarkable. Other: None. IMPRESSION: 1. Interval hemorrhage within the right middle cerebral artery distribution infarct without mass effect. 2. Previously demonstrated left frontal lobe white matter infarct. 3. Stable mild diffuse cerebral and cerebellar atrophy. Critical Value/emergent results were called by telephone at the time of interpretation on 12/29/2019 at 2:56 pm to provider [facility] Andersyn Fragoso , who verbally acknowledged these results. Electronically Signed   By: Claudie Revering M.D.   On: 12/31/2019 14:57   DG Chest Port 1 View  Result Date: 12/27/2019 CLINICAL DATA:  Hypotension. PICC line EXAM: PORTABLE CHEST 1 VIEW COMPARISON:  12/17/2019 FINDINGS: Interval PICC with its tip in the inferior aspect of the superior vena cava approximately 1.2 cm above the superior cavoatrial junction. Normal sized heart. Clear lungs with normal vascularity. Unremarkable bones. IMPRESSION: 1. Interval PICC with its tip in the inferior aspect of the superior vena cava approximately 1.2 cm above the superior cavoatrial junction. 2. No acute abnormality. Electronically Signed   By: Claudie Revering M.D.   On: 01/02/2020 14:42    Procedures .Critical Care Performed by: Pattricia Boss, MD Authorized by: Pattricia Boss, MD   Critical care provider statement:    Critical care time (minutes):  75   Critical care was necessary to treat or prevent imminent or life-threatening deterioration of the following conditions:  Sepsis, shock and CNS failure or compromise   Critical care was time spent personally by me on the following activities:  Discussions with consultants, evaluation of patient's response to treatment, examination of patient, ordering and performing treatments and interventions, ordering and review of laboratory studies, ordering and review of radiographic studies, pulse oximetry, re-evaluation of patient's condition, obtaining history from patient or  surrogate and review of old charts   (including critical care time)  Medications Ordered in ED Medications  lactated ringers bolus 1,000 mL (has no administration in time range)    And  lactated ringers bolus 1,000 mL (has no administration in time range)    And  lactated ringers bolus 1,000 mL (has no administration in time range)  ceFEPIme (MAXIPIME) 2 g in sodium chloride 0.9 % 100 mL IVPB (has no administration in time range)  metroNIDAZOLE (FLAGYL) IVPB 500 mg (has no administration in time range)  vancomycin (VANCOCIN) IVPB 1000 mg/200 mL premix (has no administration in time range)    ED Course  I have reviewed the triage vital signs and the nursing notes.  Pertinent labs & imaging results that were available during my care of the patient were reviewed by me and considered in my medical decision making (see chart for details). 1:48 PM Patient's husband at bedside and blood cultures being drawn now   MDM Rules/Calculators/A&P                      Discussed CT and reviewed  Images with Dr. Joneen Caraway Discussed with husband and patient Discussed with critical care Neurohospitalist paged-Discussed Dr. Leida Lauth- advises to ICU, keep sbp <140, hold eliquis, do not need repeat, plan repeat head ct 2 hours. Discussed above with Dr. Halford Chessman.  He will arrange transfer to [facility].  1- metastatic cholangiocarcinoma 2- stroke on anticoagulation now with hemorrhage in right mca distribution without mass effect-discussed with Dr. Malen Gauze.  Plan hold  Eliquis but do not actively reverse, repeat head CT in 2 hours, keep systolic blood pressure less than 160 3-anemia and bleeding-prbc ordered 4- hypotension with known ho sepsis- broad spectrum abx, fluids, prbc being transfused 5- hypocalcemia   Final Clinical Impression(s) / ED Diagnoses Final diagnoses:  Intracranial hemorrhage ([place])  Metastatic malignant neoplasm, unspecified site (HCC)  Anemia, unspecified type  Chronic anticoagulation     Rx / DC Orders ED Discharge Orders    None       Pattricia Boss, MD 12/28/2019 1611

## 2020-01-12 NOTE — Telephone Encounter (Signed)
TCT Carolynn Sayers, RN with [facility]. Discussed patient's case with her and arrangements made for pt to receive IVFluids tomorrow (pt has appt with Infectious disease today). Pt is to keep her PICC line for now as she needs IV access .  Will have pt receive IVF luids on 01/17/20 with CBC, CMP as well. Pam to arrange with pt's home health nurse [facility] [place])  Also able to schedule pt's CT scans now as prior authorization has been obtained.  Spoke with Dorie, RN @ Infectious disease to have her ask Dr. Tommy Medal if pt can keep PICC line for now for IV fluids and possible chemo.   Dorie states Dr. Tommy Medal is ok with pt keeping PICC line for now.  TCT Rockne Coons, pt's husband and made him aware of the above.  After all above calls, pt was transported to [facility] ED for hypotension and confusion, somnulence. Vaginal bleeding.  TCT Pam chandler to report that pt is currently in the ED. Unclear if pt will be admitted or not.  Pam states she will check pt status periodically.  Dr. Lorenso Courier aware of the above.

## 2020-01-12 NOTE — Progress Notes (Signed)
eLink Physician-Brief Progress Note Patient Name: Melissa Hurley DOB: Jan 03, 1966 MRN: [id number]   Date of Service  12/26/2019  HPI/Events of Note  Pt with metastatic cholangiocarcinoma and a previous CVA for which she was on Eliquis, admitted to 4 N ICU with questionable hemorrhagic conversion of her prior CVA, altered mental status. She was hypotensive on presentation raising concerns for sepsis since she had a recent line infection. Hypotension resolved with volume resuscitation.  eICU Interventions  New Patient Evaluation completed.        Frederik Pear 01/11/2020, 8:43 PM

## 2020-01-12 NOTE — Progress Notes (Signed)
A consult was received from an ED physician for Vancomycin and Cefepime per pharmacy dosing.  The patient's profile has been reviewed for ht/wt/allergies/indication/available labs.    A one time order has been placed for Vancomycin 2g IV and Cefepime 2g IV.  Further antibiotics/pharmacy consults should be ordered by admitting physician if indicated.                       Thank you, Luiz Ochoa 12/23/2019  1:44 PM

## 2020-01-12 NOTE — Sepsis Progress Note (Signed)
Notified bedside nurse of need to administer antibiotics and fluid bolus. This pt is a very difficult stick. 4 people have attempted to obtain blood cx as well as lab. RN states she has fluids ready to go as soon as pt returns from CT scan

## 2020-01-12 NOTE — Progress Notes (Signed)
Subjective:  Chief complaint profound lethargy and weakness  Patient ID: Melissa Hurley, female    DOB: 1966/05/10, 54 y.o.   MRN: [id number]  HPI  54 y.o. female past medical history significant for metastatic intrahepatic cholangiocarcinoma on chemotherapy with cisplatin and gemcitabine who was hospitalized in March with acute left MCA stroke and started on Eliquis presented to ER with nausea vomiting poor oral intake and diffuse weakness.  She had blood cultures taken on admission which have subsequent turned positive for corynebacterium JK group from 2 sites.  Had echocardiogram which showed a pericardial effusion but this was not thought to be due to an infectious process but rather third spacing.  Her ejection fraction was normal.  Port was removed and she was continued on antibiotics.  She was treated with IV vancomycin and cleared her blood cultures.  She was sent home with plans to complete antibiotics on the 12th which she did.  PICC line has remained in place because she was going to get chemotherapy potentially.  At one point Melissa Hurley had asked for the PICC line to be discontinued but it was not.  Since discharge she has had profound fatigue and been sleeping up to 12 hours a day.  In our clinic clinic she was oriented but quite sleepy and fatigued and when I walked in the room was had her hand and her arms on the table sleeping.   She has been seen in the oncology clinic as well in the last few weeks.  They documented her excessive somnolence.  We were called by them to ask for a CBC on her today.  When we are examining her our CNA's were unable to get blood pressure using medic blood pressure machines.  Melissa Hurley was able to finally get a blood pressure on her it was 80 over the 40s and she has been tachycardic.  Her pulse oximetry is normal.  I am concerned that she is profoundly dehydrated and also anxious that she could be anemic and/or have recurrence of bacteremia  or a second infection.  I am also concerned over her overall prognosis given her metastatic malignancy.     Past Medical History:  Diagnosis Date  . Cancer ([place])    Liver  . Cholangiocarcinoma ([place])   . Metastasis ([place])   . Pericardial effusion   . Sinus tachycardia   . Uterine fibroid     Past Surgical History:  Procedure Laterality Date  . CESAREAN SECTION    . IR REMOVAL TUN ACCESS W/ PORT W/O FL MOD SED  12/15/2019    Family History  Problem Relation Age of Onset  . Breast cancer Mother        70's  . Ovarian cancer Mother   . Lung cancer Mother        13s  . Ovarian cancer Maternal Aunt   . Breast cancer Maternal Grandmother   . Breast cancer Cousin        maternal, dx50+  . Colon cancer Neg Hx   . Esophageal cancer Neg Hx   . Rectal cancer Neg Hx   . Stomach cancer Neg Hx       Social History   Socioeconomic History  . Marital status: Married    Spouse name: Not on file  . Number of children: Not on file  . Years of education: Not on file  . Highest education level: Not on file  Occupational History  . Occupation: Pharmacist, hospital  Tobacco Use  .  Smoking status: Never Smoker  . Smokeless tobacco: Never Used  Substance and Sexual Activity  . Alcohol use: Never  . Drug use: Never  . Sexual activity: Not Currently  Other Topics Concern  . Not on file  Social History Narrative  . Not on file   Social Determinants of Health   Financial Resource Strain:   . Difficulty of Paying Living Expenses:   Food Insecurity:   . Worried About Charity fundraiser in the Last Year:   . Arboriculturist in the Last Year:   Transportation Needs:   . Film/video editor (Medical):   Marland Kitchen Lack of Transportation (Non-Medical):   Physical Activity:   . Days of Exercise per Week:   . Minutes of Exercise per Session:   Stress:   . Feeling of Stress :   Social Connections:   . Frequency of Communication with Friends and Family:   . Frequency of Social Gatherings with  Friends and Family:   . Attends Religious Services:   . Active Member of Clubs or Organizations:   . Attends Archivist Meetings:   Marland Kitchen Marital Status:     No Known Allergies   Current Outpatient Medications:  .  Amino Acids-Protein Hydrolys (FEEDING SUPPLEMENT, PRO-STAT SUGAR FREE 64,) LIQD, Take 30 mLs by mouth 2 (two) times daily., Disp: 887 mL, Rfl: 0 .  apixaban (ELIQUIS) 5 MG TABS tablet, Take 1 tablet (5 mg total) by mouth 2 (two) times daily., Disp: 60 tablet, Rfl: 0 .  dexamethasone (DECADRON) 4 MG tablet, Take 8 mg by mouth See admin instructions. Take 2 tablets (8 mg)  by mouth with breakfast on days 2 and 3 of each treatment cycle, Disp: , Rfl:  .  glycopyrrolate (ROBINUL) 1 MG tablet, Take 1 mg by mouth 3 (three) times daily as needed (drooling). , Disp: , Rfl:  .  loperamide (IMODIUM) 2 MG capsule, Take 1 capsule (2 mg total) by mouth every 6 (six) hours as needed for diarrhea or loose stools., Disp: 30 capsule, Rfl: 0 .  LORazepam (ATIVAN) 1 MG tablet, Take 1 mg by mouth every 8 (eight) hours as needed for anxiety. , Disp: , Rfl:  .  metoprolol tartrate (LOPRESSOR) 25 MG tablet, Take 1 tablet (25 mg total) by mouth 2 (two) times daily. Hold if sbp less than 100, Disp: 60 tablet, Rfl: 0 .  mirtazapine (REMERON) 7.5 MG tablet, Take 1 tablet (7.5 mg total) by mouth at bedtime., Disp: 14 tablet, Rfl: 2 .  nystatin (MYCOSTATIN) 100000 UNIT/ML suspension, Take 5 mLs by mouth every 4 (four) hours as needed (thrush). , Disp: , Rfl:  .  ondansetron (ZOFRAN) 8 MG tablet, Take 8 mg by mouth every 8 (eight) hours as needed for nausea. , Disp: , Rfl:  .  prochlorperazine (COMPAZINE) 10 MG tablet, Take 10 mg by mouth every 8 (eight) hours as needed for nausea or vomiting., Disp: , Rfl:    Review of Systems  Constitutional: Positive for activity change, chills, fatigue and unexpected weight change. Negative for appetite change, diaphoresis and fever.  HENT: Negative for congestion,  rhinorrhea, sinus pressure, sneezing, sore throat and trouble swallowing.   Eyes: Negative for photophobia and visual disturbance.  Respiratory: Negative for cough, chest tightness, shortness of breath, wheezing and stridor.   Cardiovascular: Negative for chest pain, palpitations and leg swelling.  Gastrointestinal: Negative for abdominal distention, abdominal pain, anal bleeding, blood in stool, constipation, diarrhea, nausea and vomiting.  Genitourinary:  Negative for difficulty urinating, dysuria, flank pain and hematuria.  Musculoskeletal: Negative for arthralgias, back pain, gait problem, joint swelling and myalgias.  Skin: Negative for color change, pallor, rash and wound.  Neurological: Positive for dizziness. Negative for tremors, weakness and light-headedness.  Hematological: Negative for adenopathy. Does not bruise/bleed easily.  Psychiatric/Behavioral: Negative for agitation, behavioral problems, confusion, decreased concentration, dysphoric mood and sleep disturbance.       Objective:   Physical Exam Constitutional:      General: She is not in acute distress.    Appearance: Normal appearance. She is well-developed. She is not ill-appearing or diaphoretic.  HENT:     Head: Normocephalic and atraumatic.     Right Ear: Hearing and external ear normal.     Left Ear: Hearing and external ear normal.     Nose: No nasal deformity or rhinorrhea.  Eyes:     General: No scleral icterus.    Conjunctiva/sclera: Conjunctivae normal.     Right eye: Right conjunctiva is not injected.     Left eye: Left conjunctiva is not injected.     Pupils: Pupils are equal, round, and reactive to light.  Neck:     Vascular: No JVD.  Cardiovascular:     Rate and Rhythm: Regular rhythm. Tachycardia present.     Heart sounds: Normal heart sounds, S1 normal and S2 normal. No murmur. No friction rub. No gallop.   Pulmonary:     Effort: Pulmonary effort is normal. No respiratory distress.     Breath  sounds: Stridor present. No wheezing or rhonchi.  Abdominal:     General: Bowel sounds are normal. There is no distension.     Palpations: Abdomen is soft.     Tenderness: There is no abdominal tenderness.  Musculoskeletal:        General: Normal range of motion.     Right shoulder: Normal.     Left shoulder: Normal.     Cervical back: Normal range of motion and neck supple.     Right hip: Normal.     Left hip: Normal.     Right knee: Normal.     Left knee: Normal.  Lymphadenopathy:     Head:     Right side of head: No submandibular, preauricular or posterior auricular adenopathy.     Left side of head: No submandibular, preauricular or posterior auricular adenopathy.     Cervical: No cervical adenopathy.     Right cervical: No superficial or deep cervical adenopathy.    Left cervical: No superficial or deep cervical adenopathy.  Skin:    General: Skin is warm and dry.     Coloration: Skin is not pale.     Findings: No abrasion, bruising, ecchymosis, erythema, lesion or rash.     Nails: There is no clubbing.  Neurological:     General: No focal deficit present.     Mental Status: She is alert and oriented to person, place, and time.     Sensory: No sensory deficit.     Coordination: Coordination normal.     Gait: Gait normal.  Psychiatric:        Attention and Perception: Attention normal. She is attentive.        Speech: Speech is delayed.        Behavior: Behavior normal. Behavior is cooperative.        Thought Content: Thought content normal.        Cognition and Memory: Cognition normal.  Judgment: Judgment normal.     Comments: Quite somnolent and weak     PICC is clean      Assessment & Plan:   Profound weakness with hypotension confusion excessive somnolence:  We are giving her a liter of normal saline and calling EMS to transport her to the ER.  We are drawing a stat CBC and a CMP.  I would like to get blood cultures from 2 peripheral sites i.e.  not the PICC when she arrives in the ER.  Port-A-Cath infection: Hopefully this has not recurred  Metastatic wanger carcinoma: Defer to oncology.  Goals of care would strongly recommend a palliative care consult as well.

## 2020-01-12 NOTE — H&P (Signed)
NAME:  Melissa Hurley, MRN:  [id number], DOB:  [birth year], LOS: 0 ADMISSION DATE:  01/07/2020, CONSULTATION DATE:  12/22/2019 REFERRING MD:  Dr. Jeanell Sparrow, ER, CHIEF COMPLAINT:  Lethargy   Brief History   54 yo female with metastatic cholangiocarcinoma was in hospital 12/10/19 to 12/18/19 for Corynebacterium bacteremia in setting of port a cath infection with catheter removed and treated with 2 weeks of vancomycin, Lt MCA CVA in March 2021 discharged on eliquis was sent to ER on 01/08/2020 from ID office for assessment of lethargy and hypotension.  Found to have hemorrhagic conversion of Rt MCA infarct.  PCCM asked to admit to neuro ICU.  Past Medical History  Metastatic cholangiocarcinoma Pericardial effusion Uterine fibroids Hypertension Depression  Significant Hospital Events   5/26 transfer from [facility] ER to [facility] 4n  Consults:  Neurology  Procedures:  Lt PICC 4/30 >>   Significant Diagnostic Tests:  CT head 5/26 >> interval hemorrhage in Rt MCA artery distribution infarct w/o mass effect  Micro Data:  SARS CoV2 PCR 5/26 >> negative Blood 5/26 >> Urine 5/26 >>   Antimicrobials:  Flagyl 5/26  Cefepime 5/26 Vancomycin 5/26  Interim history/subjective:    Objective   Blood pressure (!) 162/106, pulse 90, temperature (!) 97.5 F (36.4 C), temperature source Oral, resp. rate (!) 23, last menstrual period 10/25/2014, SpO2 100 %.        Intake/Output Summary (Last 24 hours) at 12/20/2019 1618 Last data filed at 12/23/2019 1541 Gross per 24 hour  Intake 195.14 ml  Output --  Net 195.14 ml   There were no vitals filed for this visit.  Examination:  General - alert Eyes - pupils reactive ENT - no sinus tenderness, no stridor Cardiac - regular rate/rhythm, no murmur Chest - equal breath sounds b/l, no wheezing or rales Abdomen - soft, non tender, + bowel sounds Extremities - no cyanosis, clubbing, or edema Skin - no rashes Neuro - normal strength, moves extremities, follows  commands Psych - normal mood and behavior GU - no lesions noted   Resolved Hospital Problem list     Assessment & Plan:   Intracerebral hemorrhage with recent history of Lt MCA infarct. - eliquis held - neurology consulted - f/u CT head - goal SBP 100 to 140 - admit to [facility] 4 north  Hypotension. - likely from hypovolemia - sepsis/infection seems less likely - defer additional ABx for now, and f/u culture results - continue IV fluids - keep PICC line in for now  Hypokalemia, hypomagnesemia. - replace and f/u labs  Anemia, thrombocytopenia in setting of malignancy. - f/u CBC - transfuse for Hb < 7 or significant bleeding  Metastatic cholangiocarcinoma. - prognosis seems poor - will need to have ongoing discussions with pt and family about goals of care  Best practice:  Diet: NPO DVT prophylaxis: SCDs GI prophylaxis: Protonix Mobility: bed rest Code Status: full code Disposition: ICU  Labs   CBC: Recent Labs  Lab 12/23/2019 1348  WBC 11.2*  NEUTROABS 7.9*  HGB 6.9*  HCT 23.6*  MCV 87.7  PLT 138*    Basic Metabolic Panel: Recent Labs  Lab 01/07/2020 1348  NA 141  K 3.3*  CL 110  CO2 22  GLUCOSE 101*  BUN 16  CREATININE 1.28*  CALCIUM 6.3*  MG 1.1*   GFR: Estimated Creatinine Clearance: 58.9 mL/min (A) (by C-G formula based on SCr of 1.28 mg/dL (H)). Recent Labs  Lab 01/05/2020 1348  WBC 11.2*  LATICACIDVEN 3.0*  Liver Function Tests: Recent Labs  Lab 12/31/2019 1348  AST 33  ALT 16  ALKPHOS 246*  BILITOT 1.3*  PROT 5.7*  ALBUMIN 1.9*   No results for input(s): LIPASE, AMYLASE in the last 168 hours. Recent Labs  Lab 12/29/2019 1348  AMMONIA 25    ABG    Component Value Date/Time   HCO3 22.2 12/10/2019 1808   TCO2 23 12/10/2019 1808   ACIDBASEDEF 2.0 12/10/2019 1808   O2SAT 59.0 12/10/2019 1808     Coagulation Profile: Recent Labs  Lab 12/18/2019 1348  INR 3.0*    Cardiac Enzymes: No results for input(s): CKTOTAL, CKMB,  CKMBINDEX, TROPONINI in the last 168 hours.  HbA1C: Hgb A1c MFr Bld  Date/Time Value Ref Range Status  10/26/2019 04:30 AM 5.7 (H) 4.8 - 5.6 % Final    Comment:    (NOTE) Pre diabetes:          5.7%-6.4% Diabetes:              >6.4% Glycemic control for   <7.0% adults with diabetes     CBG: No results for input(s): GLUCAP in the last 168 hours.  Review of Systems:   Reviewed and negative  Past Medical History  She,  has a past medical history of Bloodstream infection due to Port-A-Cath (12/26/2019), Cancer ([place]), Cholangiocarcinoma ([place]), Metastasis ([place]), Pericardial effusion, Sinus tachycardia, and Uterine fibroid.   Surgical History    Past Surgical History:  Procedure Laterality Date  . CESAREAN SECTION    . IR REMOVAL TUN ACCESS W/ PORT W/O FL MOD SED  12/15/2019     Social History   reports that she has never smoked. She has never used smokeless tobacco. She reports that she does not drink alcohol or use drugs.   Family History   Her family history includes Breast cancer in her cousin, maternal grandmother, and mother; Lung cancer in her mother; Ovarian cancer in her maternal aunt and mother. There is no history of Colon cancer, Esophageal cancer, Rectal cancer, or Stomach cancer.   Allergies No Known Allergies   Home Medications  Prior to Admission medications   Medication Sig Start Date End Date Taking? Authorizing Provider  apixaban (ELIQUIS) 5 MG TABS tablet Take 1 tablet (5 mg total) by mouth 2 (two) times daily. 01/06/20  Yes Orson Slick, MD  dexamethasone (DECADRON) 4 MG tablet Take 8 mg by mouth See admin instructions. Take 2 tablets (8 mg)  by mouth with breakfast on days 2 and 3 of each treatment cycle 10/04/19  Yes [provider]  glycopyrrolate (ROBINUL) 1 MG tablet Take 1 mg by mouth 3 (three) times daily as needed (drooling).  12/06/19  Yes [provider]  loperamide (IMODIUM) 2 MG capsule Take 1 capsule (2 mg total) by mouth  every 6 (six) hours as needed for diarrhea or loose stools. 12/18/19  Yes Florencia Reasons, MD  LORazepam (ATIVAN) 1 MG tablet Take 1 mg by mouth every 8 (eight) hours as needed for anxiety.  10/12/19  Yes [provider]  metoprolol tartrate (LOPRESSOR) 25 MG tablet Take 1 tablet (25 mg total) by mouth 2 (two) times daily. Hold if sbp less than 100 12/18/19  Yes Florencia Reasons, MD  mirtazapine (REMERON) 7.5 MG tablet Take 1 tablet (7.5 mg total) by mouth at bedtime. 01/04/20  Yes Orson Slick, MD  nystatin (MYCOSTATIN) 100000 UNIT/ML suspension Take 5 mLs by mouth every 4 (four) hours as needed (thrush).  10/14/19  Yes [provider]  ondansetron (ZOFRAN) 8 MG tablet Take 8 mg by mouth every 8 (eight) hours as needed for nausea.  09/20/19  Yes [provider]  prochlorperazine (COMPAZINE) 10 MG tablet Take 10 mg by mouth every 8 (eight) hours as needed for nausea or vomiting.   Yes [provider]     Critical care time: 43 minutes  Chesley Mires, MD [place] Pager - [phone] 01/07/2020, 4:41 PM

## 2020-01-12 NOTE — ED Triage Notes (Signed)
Per [facility] pt from Infectious Disease fro Hypotension 62 palpated stage 4 liver cancer. Vaginal bleeding x 3 days-1 pad per day. Pt has PICC line and given NS 419ml en route.

## 2020-01-12 NOTE — Sepsis Progress Note (Signed)
Notified bedside nurse of need to draw repeat lactic acid. 

## 2020-01-12 NOTE — Plan of Care (Addendum)
54 year old female with metastatic cholangiocarcinoma, history of right hemispheric CVA admitted in March 2021 started on Eliquis for stroke from hypercoagulable state due to cancer. Presents to ID office with generalized weakness and confusion-noted to have blood pressure in the 60s.  Transferred to [facility] long emergency department-work-up included vaginal bleeding, hemoglobin of 6.9 and CT head showing small amount of hemorrhage in the area of prior infarct.  Hemorrhage does not have mass-effect-and likely subacute since she has been feeling confused and weak for few days, does not need immediate reversal with Andexxa at this point.  However would repeat CT head-if hemorrhage worsening then consider reversal with Andexxa. Also, possible it is laminar necrosis.   Regarding patient's multiple medical issues including hemorrhagic shock, metastatic cancer, possible infection recommend CCM to be primary.   Recommendations Repeat CT head in 3 hours to make sure hemorrhage is stable Hold Eliquis, antiplatelets Frequent neurochecks Blood pressure less than [id number] systolic, patient currently hypotensive.  Neurology will see the patient when she arrives to [facility].  Please notify neurology MD immediately if there is a change in neurological exam.

## 2020-01-12 NOTE — ED Notes (Signed)
Patient stuck by two RNs 3 times for blood cultures, both unsuccessful.

## 2020-01-13 ENCOUNTER — Inpatient Hospital Stay (HOSPITAL_COMMUNITY): Payer: BC Managed Care – PPO

## 2020-01-13 ENCOUNTER — Encounter (HOSPITAL_COMMUNITY): Payer: Self-pay | Admitting: Pulmonary Disease

## 2020-01-13 DIAGNOSIS — I629 Nontraumatic intracranial hemorrhage, unspecified: Secondary | ICD-10-CM

## 2020-01-13 LAB — TYPE AND SCREEN
ABO/RH(D): O POS
Antibody Screen: NEGATIVE
Unit division: 0

## 2020-01-13 LAB — BPAM RBC
Blood Product Expiration Date: 202106252359
ISSUE DATE / TIME: 202105261555
Unit Type and Rh: 5100

## 2020-01-13 LAB — COMPREHENSIVE METABOLIC PANEL
ALT: 13 U/L (ref 0–44)
AST: 25 U/L (ref 15–41)
Albumin: 1.4 g/dL — ABNORMAL LOW (ref 3.5–5.0)
Alkaline Phosphatase: 215 U/L — ABNORMAL HIGH (ref 38–126)
Anion gap: 14 (ref 5–15)
BUN: 13 mg/dL (ref 6–20)
CO2: 19 mmol/L — ABNORMAL LOW (ref 22–32)
Calcium: 6.2 mg/dL — CL (ref 8.9–10.3)
Chloride: 109 mmol/L (ref 98–111)
Creatinine, Ser: 1.15 mg/dL — ABNORMAL HIGH (ref 0.44–1.00)
GFR calc Af Amer: >60 mL/min (ref >60)
GFR calc non Af Amer: 55 mL/min — ABNORMAL LOW (ref >60)
Glucose, Bld: 77 mg/dL (ref 70–99)
Potassium: 2.9 mmol/L — ABNORMAL LOW (ref 3.5–5.1)
Sodium: 142 mmol/L (ref 135–145)
Total Bilirubin: 1.1 mg/dL (ref 0.3–1.2)
Total Protein: 5.1 g/dL — ABNORMAL LOW (ref 6.5–8.1)

## 2020-01-13 LAB — CBC
HCT: 26.8 % — ABNORMAL LOW (ref 36.0–46.0)
Hemoglobin: 8.1 g/dL — ABNORMAL LOW (ref 12.0–15.0)
MCH: 26 pg (ref 26.0–34.0)
MCHC: 30.2 g/dL (ref 30.0–36.0)
MCV: 86.2 fL (ref 80.0–100.0)
Platelets: 105 10*3/uL — ABNORMAL LOW (ref 150–400)
RBC: 3.11 MIL/uL — ABNORMAL LOW (ref 3.87–5.11)
RDW: 17.3 % — ABNORMAL HIGH (ref 11.5–15.5)
WBC: 10.2 10*3/uL (ref 4.0–10.5)
nRBC: 0 % (ref 0.0–0.2)

## 2020-01-13 LAB — BASIC METABOLIC PANEL
Anion gap: 8 (ref 5–15)
BUN: 11 mg/dL (ref 6–20)
CO2: 20 mmol/L — ABNORMAL LOW (ref 22–32)
Calcium: 6.4 mg/dL — CL (ref 8.9–10.3)
Chloride: 113 mmol/L — ABNORMAL HIGH (ref 98–111)
Creatinine, Ser: 1.29 mg/dL — ABNORMAL HIGH (ref 0.44–1.00)
GFR calc Af Amer: 55 mL/min — ABNORMAL LOW (ref >60)
GFR calc non Af Amer: 48 mL/min — ABNORMAL LOW (ref >60)
Glucose, Bld: 85 mg/dL (ref 70–99)
Potassium: 4 mmol/L (ref 3.5–5.1)
Sodium: 141 mmol/L (ref 135–145)

## 2020-01-13 LAB — PHOSPHORUS: Phosphorus: 2.5 mg/dL (ref 2.5–4.6)

## 2020-01-13 LAB — CORTISOL-AM, BLOOD: Cortisol - AM: 13.1 ug/dL (ref 6.7–22.6)

## 2020-01-13 MED ORDER — CALCIUM GLUCONATE-NACL 1-0.675 GM/50ML-% IV SOLN
1.0000 g | Freq: Once | INTRAVENOUS | Status: AC
  Administered 2020-01-13: 1000 mg via INTRAVENOUS

## 2020-01-13 MED ORDER — METOCLOPRAMIDE HCL 5 MG/ML IJ SOLN
5.0000 mg | Freq: Three times a day (TID) | INTRAMUSCULAR | Status: DC
  Administered 2020-01-13 – 2020-01-19 (×18): 5 mg via INTRAVENOUS
  Filled 2020-01-13 (×18): qty 2

## 2020-01-13 MED ORDER — MAGNESIUM SULFATE 50 % IJ SOLN
6.0000 g | Freq: Once | INTRAVENOUS | Status: AC
  Administered 2020-01-13: 6 g via INTRAVENOUS
  Filled 2020-01-13: qty 12

## 2020-01-13 MED ORDER — METOPROLOL TARTRATE 12.5 MG HALF TABLET
12.5000 mg | ORAL_TABLET | Freq: Two times a day (BID) | ORAL | Status: DC
  Administered 2020-01-13 – 2020-01-15 (×4): 12.5 mg via ORAL
  Filled 2020-01-13 (×6): qty 1

## 2020-01-13 MED ORDER — POTASSIUM CHLORIDE 10 MEQ/50ML IV SOLN
10.0000 meq | INTRAVENOUS | Status: AC
  Administered 2020-01-13 (×8): 10 meq via INTRAVENOUS
  Filled 2020-01-13 (×8): qty 50

## 2020-01-13 MED ORDER — CALCIUM GLUCONATE-NACL 1-0.675 GM/50ML-% IV SOLN
1.0000 g | Freq: Once | INTRAVENOUS | Status: AC
  Administered 2020-01-13: 1000 mg via INTRAVENOUS
  Filled 2020-01-13: qty 50

## 2020-01-13 MED ORDER — APIXABAN 5 MG PO TABS
5.0000 mg | ORAL_TABLET | Freq: Two times a day (BID) | ORAL | Status: DC
  Administered 2020-01-13 – 2020-01-14 (×5): 5 mg via ORAL
  Filled 2020-01-13 (×6): qty 1

## 2020-01-13 NOTE — Progress Notes (Deleted)
Cardiology Office Note:    Date:  01/13/2020   ID:  [name], DOB [birth year], MRN [id number]  PCP:  Patient, No Pcp Per  Cardiologist:  Will Meredith Leeds, MD *** Electrophysiologist:  None   Referring MD: No ref. provider found   Chief Complaint:  No chief complaint on file.    Patient Profile:    Melissa Hurley is a 54 y.o. female with:   Metastatic cholangiocarcinoma   S/p CVA (L MCA in 10/2019) >> managed with Apixaban   Hx of bacteremia (port a cath infection) in 11/2019  Pericardial effusion (suspected malignant)  Chronic kidney disease   Pancytopenia   PSVT  NSVT    Prior CV studies: Echocardiogram 12/13/19 EF 55-60, mild LVH, normal RVSF, mild LAE, mod to lg pericardial eff - no evidence of tamponade, mild AI  Echocardiogram 10/26/19 EF 60-65, trivial eff  Echocardiogram 09/22/19 EF 70-75, small eff  Echocardiogram 08/03/19 EF 55-60, mod eff, no evidence of tamponade  History of Present Illness:    [name] was initially evaluated in clinic by Dr. Curt Bears for a pericardial effusion.  This was noted just prior to her dx of cholangiocarcinoma.  She was recently seen by Cardiology during an admission in 11/2019 for bacteremia requiring removal of her port-a-cath.  Her effusion was stable without tamponade physiology and pericardiocentesis was not felt to be needed.  Pericardial window could be considered if she has continued fevers or signs of tamponade.  She was readmitted ***  The [employer] SmartLink is not supported in this context. ***   Past Medical History:  Diagnosis Date  . Bloodstream infection due to Port-A-Cath 12/19/2019  . Cancer ([place])    Liver  . Cholangiocarcinoma ([place])   . Metastasis ([place])   . Pericardial effusion   . Sinus tachycardia   . Uterine fibroid     Current Medications: No outpatient medications have been marked as taking for the 01/14/20 encounter (Appointment) with Richardson Dopp T, PA-C.     Allergies:   Patient  has no known allergies.   Social History   Tobacco Use  . Smoking status: Never Smoker  . Smokeless tobacco: Never Used  Substance Use Topics  . Alcohol use: Never  . Drug use: Never     Family Hx: The patient's family history includes Breast cancer in her cousin, maternal grandmother, and mother; Lung cancer in her mother; Ovarian cancer in her maternal aunt and mother. There is no history of Colon cancer, Esophageal cancer, Rectal cancer, or Stomach cancer.  ROS   EKGs/Labs/Other Test Reviewed:    EKG:  EKG is *** ordered today.  The ekg ordered today demonstrates ***  Recent Labs: 12/18/2019: TSH 2.610 01/15/2020: Magnesium 1.1 01/13/2020: ALT 13; BUN 11; Creatinine, Ser 1.29; Hemoglobin 8.1; Platelets 105; Potassium 4.0; Sodium 141   Recent Lipid Panel Lab Results  Component Value Date/Time   CHOL 108 10/26/2019 04:30 AM   TRIG 194 (H) 10/26/2019 04:30 AM   HDL 14 (L) 10/26/2019 04:30 AM   CHOLHDL 7.7 10/26/2019 04:30 AM   LDLCALC 55 10/26/2019 04:30 AM    Physical Exam:    VS:  LMP 10/25/2014     Wt Readings from Last 3 Encounters:  01/09/2020 206 lb 12.7 oz (93.8 kg)  01/03/20 203 lb 13.6 oz (92.5 kg)  12/10/19 198 lb 13.7 oz (90.2 kg)     Physical Exam ***  ASSESSMENT & PLAN:    ***  Dispo:  No follow-ups on file.  Medication Adjustments/Labs and Tests Ordered: Current medicines are reviewed at length with the patient today.  Concerns regarding medicines are outlined above.  Tests Ordered: No orders of the defined types were placed in this encounter.  Medication Changes: No orders of the defined types were placed in this encounter.   Signed, Rosabelle Jagers, PA-C  01/13/2020 10:13 PM    [facility] [address], [place], [place]  [address] Phone: [phone]; Fax: [phone]

## 2020-01-13 NOTE — Progress Notes (Signed)
eLink Physician-Brief Progress Note Patient Name: Melissa Hurley DOB: 05-Jan-1966 MRN: [id number]   Date of Service  01/13/2020  HPI/Events of Note  Serum Calcium 6.2, Serum Albumin 1.4, Corrected  Calcium 8.3  eICU Interventions  Calcium gluconate 1 gm iv ordered.        Kerry Kass [name] 01/13/2020, 6:24 AM

## 2020-01-13 NOTE — Progress Notes (Signed)
K was 2.9 and was replaced per protocol  [facility]

## 2020-01-13 NOTE — Progress Notes (Addendum)
   NAME:  Melissa Hurley, MRN:  [id number], DOB:  1965-11-17, LOS: 1 ADMISSION DATE:  01/06/2020, CONSULTATION DATE:  12/18/2019 REFERRING MD:  Dr. Jeanell Sparrow, ER, CHIEF COMPLAINT:  Lethargy   Brief History   54 yo female with metastatic cholangiocarcinoma was in hospital 12/10/19 to 12/18/19 for Corynebacterium bacteremia in setting of port a cath infection with catheter removed and treated with 2 weeks of vancomycin, Lt MCA CVA in March 2021 discharged on eliquis was sent to ER on 12/25/2019 from ID office for assessment of lethargy and hypotension.  Found to have hemorrhagic conversion of Rt MCA infarct.  PCCM asked to admit to neuro ICU.  Past Medical History  Metastatic cholangiocarcinoma Pericardial effusion Uterine fibroids Hypertension Depression  Significant Hospital Events   5/26 transfer from [facility] ER to [facility] 4n  Consults:  Neurology  Procedures:  Lt PICC 4/30 >>   Significant Diagnostic Tests:  CT head 5/26 >> interval hemorrhage in Rt MCA artery distribution infarct w/o mass effect MRI brain 5/26 > expected interval evolution of subacute cortical and subcortical infarcts involving bilateral cerebral hemispheres.  Micro Data:  SARS CoV2 PCR 5/26 >> negative Blood 5/26 >> Urine 5/26 >>   Antimicrobials:  Flagyl 5/26  Cefepime 5/26 Vancomycin 5/26  Interim history/subjective:  Feels "fine". BP improved. Asking for food.  Objective   Blood pressure 106/74, pulse 89, temperature 98.7 F (37.1 C), temperature source Oral, resp. rate 19, height 5\' 7"  (1.702 m), weight 93.8 kg, last menstrual period 10/25/2014, SpO2 100 %.        Intake/Output Summary (Last 24 hours) at 01/13/2020 0836 Last data filed at 01/13/2020 0700 Gross per 24 hour  Intake 4375.63 ml  Output -  Net 4375.63 ml   Filed Weights   12/24/2019 2020  Weight: 93.8 kg    Examination: General: Adult female, resting in bed, in NAD. Neuro: A&O x 3, no deficits. HEENT: [place]/AT. Sclerae anicteric. EOMI.  Cardiovascular: RRR, no M/R/G.  Lungs: Respirations even and unlabored.  CTA bilaterally, No W/R/R. Abdomen: BS x 4, soft, NT/ND.  Musculoskeletal: No gross deformities, no edema.  Skin: Intact, warm, no rashes.   Assessment & Plan:   Recent Lt MCA infarct - initial concern for hemorrhagic conversion this admit, but this has been ruled out with MRI. - Continue eliquis per neurology. - Supportive care. - No further recs from neuro.  Hypotension - presumed from hypovolemia.  No indication of infection.  Now improved. - Continue MIVF. - Start diet. - keep PICC line in for now.  Hypokalemia, hypomagnesemia - s/p repletion. - F/u labs.  Anemia, thrombocytopenia in setting of malignancy. - f/u CBC. - transfuse for Hb < 7 or significant bleeding.  Dysphagia. - Add reglan. - Get UGI series to r/o any obstruction given hx cholangiocarcinoma.  Metastatic cholangiocarcinoma -  prognosis seems poor. - will need to have ongoing discussions with pt and family about goals of care.   Ok to transfer out of ICU today to med surge.  Likely d/c in AM 5/28 if remains stable.  Best practice:  Diet: Heart healthy diet. DVT prophylaxis: SCDs, eliquis GI prophylaxis: Protonix Mobility: bed rest Code Status: full code Disposition: Transfer to med surge.   Montey Hora, [facility] 01/13/2020, 8:41 AM

## 2020-01-13 NOTE — Progress Notes (Signed)
MRI reviewed, no evidence of hemorrhagic transformation. I have ordered eliquis. Lethargy likely due to medical condition/hypotension.   Please call if neurology can be of further assistance.   Roland Rack, MD [facility] [phone]  If 7pm- 7am, please page neurology on call as listed in [place].

## 2020-01-13 NOTE — Consult Note (Signed)
[place]  [phone] Fax:([phone]) [place]  Reason for Consultation: Metastatic cholangiocarcinoma  Hematological/Oncological History  #Metastatic Intrahepatic Cholangiocarcinoma, Stage IV 1) 07/30/2019: CT scan performed for generalized abdominal pain and bloating. Findings showed multiple hypervascular masses, abdominal lymphadenopathy, lung nodules, and thickening of the GE junction.  2) 08/04/2019: EGD showed candida infection of the esophagus, small hiatal hernia, with no other concerning findings.Colonoscopy performed with no malignancy or abnormalities noted. 3) 08/06/2019: establish care with Dr. Lorenso Courier 4) 08/17/2019: US guided biopsy of the liver lesion, confirmed adenocarcinoma without clearly denoting the origin. Pancreaticobiliary vs GYN origin.  5) 08/26/2019: underwent endometrial biopsy with Ob/Gyn. No evidence of GYN malignancy.  6)  10/04/2019: started Cycle 1 Day 1 Gem/cis with Dr. Jimmy Footman 7) 11/03/2019: Cycle 2 Day 1 of Gem/cis (delayed due to CVA)  8) 11/24/2019: Cycle 3 Day 1 of Gem/cis 9) 12/16/2019: Reconnected with [facility] [facility] after admission for bacteremia.  10) 01/03/2020: re-establish care with Dr. Lorenso Courier   HPI: Melissa Hurley is a 54 year old female with metastatic cholangiocarcinoma and history of right MCA CVA in March 2021.  She was sent to the emergency room on 01/08/2020 from the ID office for assessment of lethargy and hypotension.  She was found to have a hemorrhagic conversion of right MCA infarct on CT scan however MRI of the brain did not show any evidence of hemorrhagic transformation.  Today, the patient reports that she is feeling much better.  He has been more alert and interactive.  She is not currently having any pain.  Reports some nausea and vomiting.  Denies headaches and dizziness.  Denies chest pain, shortness of breath, abdominal pain.  No bleeding reported.  She reports  intermittent diarrhea.    Past Medical History:  Diagnosis Date  . Bloodstream infection due to Port-A-Cath 01/04/2020  . Cancer ([place])    Liver  . Cholangiocarcinoma ([place])   . Metastasis ([place])   . Pericardial effusion   . Sinus tachycardia   . Uterine fibroid   :  Past Surgical History:  Procedure Laterality Date  . CESAREAN SECTION    . IR REMOVAL TUN ACCESS W/ PORT W/O FL MOD SED  12/15/2019  :  Current Facility-Administered Medications  Medication Dose Route Frequency Provider Last Rate Last Admin  . 0.9 %  sodium chloride infusion   Intravenous Continuous Montey Hora P, PA-C 75 mL/hr at 01/13/20 0843 Rate Change at 01/13/20 0843  . acetaminophen (TYLENOL) tablet 650 mg  650 mg Oral Q4H PRN Chesley Mires, MD       Or  . acetaminophen (TYLENOL) 160 MG/5ML solution 650 mg  650 mg Per Tube Q4H PRN Chesley Mires, MD       Or  . acetaminophen (TYLENOL) suppository 650 mg  650 mg Rectal Q4H PRN Chesley Mires, MD      . apixaban (ELIQUIS) tablet 5 mg  5 mg Oral BID Greta Doom, MD   5 mg at 01/13/20 1420  . calcium gluconate 1 g/ 50 mL sodium chloride IVPB  1 g Intravenous Once Frederik Pear, MD      . Chlorhexidine Gluconate Cloth 2 % PADS 6 each  6 each Topical Daily Chesley Mires, MD   6 each at 01/13/20 1000  . magnesium sulfate 6 g in dextrose 5 % 100 mL IVPB  6 g Intravenous Once Agarwala, Ravi, MD      . metoCLOPramide (REGLAN) injection 5 mg  5 mg  Intravenous Q8H Kipp Brood, MD   5 mg at 01/13/20 1420  . metoprolol tartrate (LOPRESSOR) tablet 12.5 mg  12.5 mg Oral BID Desai, Rahul P, PA-C      . ondansetron (ZOFRAN) injection 4 mg  4 mg Intravenous Q6H PRN Chesley Mires, MD      . pantoprazole (PROTONIX) injection 40 mg  40 mg Intravenous QHS Chesley Mires, MD   40 mg at 01/15/2020 2233  . potassium chloride 10 mEq in 50 mL *CENTRAL LINE* IVPB  10 mEq Intravenous Q1H Frederik Pear, MD 50 mL/hr at 01/13/20 1425 10 mEq at 01/13/20 1425  . senna-docusate  (Senokot-S) tablet 1 tablet  1 tablet Oral BID Chesley Mires, MD         No Known Allergies:  Family History  Problem Relation Age of Onset  . Breast cancer Mother        23's  . Ovarian cancer Mother   . Lung cancer Mother        91s  . Ovarian cancer Maternal Aunt   . Breast cancer Maternal Grandmother   . Breast cancer Cousin        maternal, dx50+  . Colon cancer Neg Hx   . Esophageal cancer Neg Hx   . Rectal cancer Neg Hx   . Stomach cancer Neg Hx   :  Social History   Socioeconomic History  . Marital status: Married    Spouse name: Not on file  . Number of children: Not on file  . Years of education: Not on file  . Highest education level: Not on file  Occupational History  . Occupation: Pharmacist, hospital  Tobacco Use  . Smoking status: Never Smoker  . Smokeless tobacco: Never Used  Substance and Sexual Activity  . Alcohol use: Never  . Drug use: Never  . Sexual activity: Not Currently  Other Topics Concern  . Not on file  Social History Narrative  . Not on file   Social Determinants of Health   Financial Resource Strain:   . Difficulty of Paying Living Expenses:   Food Insecurity:   . Worried About Charity fundraiser in the Last Year:   . Arboriculturist in the Last Year:   Transportation Needs:   . Film/video editor (Medical):   Marland Kitchen Lack of Transportation (Non-Medical):   Physical Activity:   . Days of Exercise per Week:   . Minutes of Exercise per Session:   Stress:   . Feeling of Stress :   Social Connections:   . Frequency of Communication with Friends and Family:   . Frequency of Social Gatherings with Friends and Family:   . Attends Religious Services:   . Active Member of Clubs or Organizations:   . Attends Archivist Meetings:   Marland Kitchen Marital Status:   Intimate Partner Violence:   . Fear of Current or Ex-Partner:   . Emotionally Abused:   Marland Kitchen Physically Abused:   . Sexually Abused:   :  Review of Systems: A comprehensive 14 point  review of systems was negative except as noted in the HPI.  Exam: Patient Vitals for the past 24 hrs:  BP Temp Temp src Pulse Resp SpO2 Height Weight  01/13/20 1400 96/74 -- -- (!) 101 (!) 22 100 % -- --  01/13/20 1300 (!) 84/61 -- -- (!) 110 18 99 % -- --  01/13/20 1200 100/88 99.9 F (37.7 C) Oral 96 (!) 21 100 % -- --  01/13/20 1120 108/81 -- -- (!) 107 (!) 24 100 % -- --  01/13/20 1000 114/89 -- -- (!) 114 (!) 22 100 % -- --  01/13/20 0900 (!) 132/113 -- -- (!) 104 (!) 27 100 % -- --  01/13/20 0800 108/79 98.5 F (36.9 C) Oral 87 19 100 % -- --  01/13/20 0700 106/74 -- -- 89 19 100 % -- --  01/13/20 0600 105/75 -- -- 93 20 100 % -- --  01/13/20 0500 105/62 -- -- 89 15 100 % -- --  01/13/20 0400 106/80 -- -- 89 19 100 % -- --  01/13/20 0355 -- 98.7 F (37.1 C) Oral -- -- -- -- --  01/13/20 0300 116/79 -- -- 85 20 100 % -- --  01/13/20 0200 100/75 -- -- 87 (!) 21 100 % -- --  01/13/20 0100 93/73 -- -- 89 15 100 % -- --  01/13/20 0000 116/72 -- -- 88 (!) 22 100 % -- --  01/08/2020 2351 -- 98.4 F (36.9 C) Oral -- -- -- -- --  12/22/2019 2300 (!) 125/92 -- -- 80 19 100 % -- --  01/07/2020 2200 103/61 -- -- 86 (!) 21 99 % -- --  12/28/2019 2100 (!) 127/51 -- -- 90 (!) 23 100 % -- --  01/01/2020 2030 132/72 -- -- 97 20 100 % -- --  01/06/2020 2020 (!) 129/104 98.2 F (36.8 C) Oral 100 (!) 37 99 % 5\' 7"  (1.702 m) 93.8 kg  01/03/2020 1930 124/90 -- -- 96 (!) 26 100 % -- --  12/18/2019 1900 124/87 -- -- 95 (!) 34 100 % -- --  01/11/2020 1830 134/78 -- -- (!) 124 (!) 31 96 % -- --  12/29/2019 1800 (!) 132/106 -- -- (!) 101 20 100 % -- --  01/01/2020 1730 (!) 120/109 -- -- (!) 102 (!) 28 94 % -- --  01/07/2020 1700 130/76 -- -- 93 (!) 33 100 % -- --  12/20/2019 1639 (!) 144/128 98.6 F (37 C) Oral 93 (!) 24 99 % -- --  01/14/2020 1630 (!) 143/97 -- -- 94 (!) 29 100 % -- --  01/10/2020 1622 105/73 (!) 97.5 F (36.4 C) Oral 94 (!) 26 -- -- --  12/25/2019 1600 105/73 -- -- 91 (!) 36 100 % -- --  01/17/2020 1554 (!)  162/106 (!) 97.5 F (36.4 C) Oral 90 (!) 23 100 % -- --  12/24/2019 1554 (!) 126/106 -- -- 96 (!) 29 100 % -- --  12/18/2019 1530 124/89 -- -- 93 (!) 34 100 % -- --    General:  well-nourished in no acute distress.     Respiratory: lungs were clear bilaterally without wheezing or crackles.   Cardiovascular:  Regular rate and rhythm, S1/S2, without murmur, rub or gallop.  There was no pedal edema.   GI:  abdomen was soft, flat, nontender, nondistended, without organomegaly.   Skin exam was without echymosis, petichae.   Neuro exam was nonfocal. Patient was alert and oriented.  Attention was good.   Language was appropriate.  Mood was normal without depression.  Speech was not pressured.  Thought content was not tangential.     Lab Results  Component Value Date   WBC 10.2 01/13/2020   HGB 8.1 (L) 01/13/2020   HCT 26.8 (L) 01/13/2020   PLT 105 (L) 01/13/2020   GLUCOSE 77 01/13/2020   CHOL 108 10/26/2019   TRIG 194 (H) 10/26/2019   HDL 14 (L) 10/26/2019  LDLCALC 55 10/26/2019   ALT 13 01/13/2020   AST 25 01/13/2020   NA 142 01/13/2020   K 2.9 (L) 01/13/2020   CL 109 01/13/2020   CREATININE 1.15 (H) 01/13/2020   BUN 13 01/13/2020   CO2 19 (L) 01/13/2020    CT HEAD WO CONTRAST  Result Date: 12/27/2019 CLINICAL DATA:  Intracranial hemorrhage EXAM: CT HEAD WITHOUT CONTRAST TECHNIQUE: Contiguous axial images were obtained from the base of the skull through the vertex without intravenous contrast. COMPARISON:  None. FINDINGS: Brain: Cortical hyperdensity within the right temporal lobe is unchanged from the earlier scan. This is in the region of encephalomalacia related prior MCA territory infarct. There is mild volume loss in generalized pattern. Vascular: No abnormal hyperdensity of the major intracranial arteries or dural venous sinuses. No intracranial atherosclerosis. Skull: The visualized skull base, calvarium and extracranial soft tissues are normal. Sinuses/Orbits: No fluid levels or  advanced mucosal thickening of the visualized paranasal sinuses. No mastoid or middle ear effusion. The orbits are normal. IMPRESSION: 1. Hyperdensity over the right temporal lobe is likely cortical laminar necrosis rather than acute hemorrhage. 2. Unchanged head CT. These results were called by telephone at the time of interpretation on 01/01/2020 at 8:23 pm to provider Samara Snide , who verbally acknowledged these results. Electronically Signed   By: Ulyses Jarred M.D.   On: 12/23/2019 20:23   CT Head Wo Contrast  Result Date: 12/31/2019 CLINICAL DATA:  Altered mental status. EXAM: CT HEAD WITHOUT CONTRAST TECHNIQUE: Contiguous axial images were obtained from the base of the skull through the vertex without intravenous contrast. COMPARISON:  Head CT and brain MR dated 10/25/2019 FINDINGS: Brain: Again demonstrated is low density in a right middle cerebral artery distribution with interval linear high density components measuring up to 88 Hounsfield units in density. No mass effect. Interval small area of low density in the left frontal white matter in an area of previously restricted diffusion on the MR. Stable mildly enlarged ventricles and cortical sulci. Vascular: No hyperdense vessel or unexpected calcification. Skull: Normal. Negative for fracture or focal lesion. Sinuses/Orbits: Unremarkable. Other: None. IMPRESSION: 1. Interval hemorrhage within the right middle cerebral artery distribution infarct without mass effect. 2. Previously demonstrated left frontal lobe white matter infarct. 3. Stable mild diffuse cerebral and cerebellar atrophy. Critical Value/emergent results were called by telephone at the time of interpretation on 12/21/2019 at 2:56 pm to provider [facility] RAY , who verbally acknowledged these results. Electronically Signed   By: Claudie Revering M.D.   On: 12/19/2019 14:57   MR BRAIN WO CONTRAST  Result Date: 01/09/2020 CLINICAL DATA:  Follow-up examination for stroke, calcification  versus hemorrhage. EXAM: MRI HEAD WITHOUT CONTRAST TECHNIQUE: Multiplanar, multiecho pulse sequences of the brain and surrounding structures were obtained without intravenous contrast. COMPARISON:  Prior CT from earlier same day as well as previous MRI from 10/25/2019. FINDINGS: Brain: Generalized age-related cerebral atrophy. Mild chronic microvascular ischemic disease again noted within the supratentorial cerebral white matter. There has been interval evolution of recently identified infarcts involving the right temporal and parietal lobes as well as the left centrum semi ovale. Overall, distribution is relatively unchanged. Scattered areas of susceptibility artifact at the right temporal lobe corresponds with hyperdensity seen on prior CT. 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 No evidence for hemorrhagic transformation or other complication. No other evidence for new or interval infarction. No acute  intracranial hemorrhage. Additional single punctate chronic microhemorrhage noted at the right lentiform nucleus. No mass lesion, midline shift or mass effect. Ventricles normal size without hydrocephalus. No extra-axial fluid collection. Incidental note made of a partially empty sella. Midline structures intact. Vascular: Major intracranial vascular flow voids are maintained. Skull and upper cervical spine: Craniocervical junction normal. Bone marrow signal intensity within normal limits. No scalp soft tissue abnormality. Sinuses/Orbits: Globes and orbital soft tissues within normal limits. Mild scattered mucosal thickening noted within the maxillary sinuses bilaterally, right greater than left. Small bilateral mastoid effusions noted. Visualized nasopharynx within normal limits. Other: None. IMPRESSION: 1. Normal expected interval evolution of subacute cortical and  subcortical infarcts involving the bilateral cerebral hemispheres. 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 No evidence for hemorrhagic transformation. 2. No other new acute intracranial abnormality. 3. Underlying atrophy with chronic small vessel ischemic disease, stable. Electronically Signed   By: Jeannine Boga M.D.   On: 01/17/2020 22:46   IR REMOVAL TUN ACCESS W/ PORT W/O FL MOD SED  Result Date: 12/15/2019 INDICATION: 54 year old female with a history of right port catheter infection referred for removal EXAM: REMOVAL RIGHT IJ VEIN PORT-A-CATH MEDICATIONS: 2 g Ancef; The antibiotic was administered within an appropriate time interval prior to skin puncture. ANESTHESIA/SEDATION: Moderate (conscious) sedation was employed during this procedure. A total of Versed 2.0 mg and Fentanyl 100 mcg was administered intravenously. Moderate Sedation Time: 25 minutes. The patient's level of consciousness and vital signs were monitored continuously by radiology nursing throughout the procedure under my direct supervision. FLUOROSCOPY TIME:  None COMPLICATIONS: None PROCEDURE: Informed consent was obtained from the patient following an explanation of the procedure, risks, benefits and alternatives. The patient understands, agrees and consents for the procedure. All questions were addressed. A time out was performed prior to the initiation of the procedure. The patient was positioned in the operation suite in the supine position on a gantry. Visual inspection demonstrates no redness at the site with no ballotable fluid collection. No purulence. The previous scar on the right chest was generously infiltrated with 1% lidocaine for local anesthesia. Infiltration of the skin and subcutaneous tissues surrounding the port was performed. Using sharp and blunt dissection, the double-lumen port  apparatus and subcutaneous catheter were removed in their entirety. No purulence within the pocket which was in good repair. Generous irrigation was performed. The port pocket was then closed with interrupted Vicryl layer and a running subcuticular with 4-0 Monocryl. The skin was sealed with Derma bond. A sterile dressing was placed. Tip culture was sent. The patient tolerated the procedure well and remained hemodynamically stable throughout. No complications were encountered and no significant blood loss was encountered. IMPRESSION: Status post right IJ port catheter removal. Signed, Dulcy Fanny. Dellia Nims, RPVI [facility] Electronically Signed   By: Corrie Mckusick D.O.   On: 12/15/2019 16:36   DG Chest Port 1 View  Result Date: 12/25/2019 CLINICAL DATA:  Hypotension. PICC line EXAM: PORTABLE CHEST 1 VIEW COMPARISON:  12/17/2019 FINDINGS: Interval PICC with its tip in the inferior aspect of the superior vena cava approximately 1.2 cm above the superior cavoatrial junction. Normal sized heart. Clear lungs with normal vascularity. Unremarkable bones. IMPRESSION: 1. Interval PICC with its tip in the inferior aspect of the superior vena cava approximately 1.2 cm above the superior cavoatrial junction. 2. No acute abnormality. Electronically Signed   By: Remo Lipps  Joneen Caraway M.D.   On: 12/18/2019 14:42   DG CHEST PORT 1 VIEW  Result Date: 12/17/2019 CLINICAL DATA:  Cough EXAM: PORTABLE CHEST 1 VIEW COMPARISON:  12/10/2019 FINDINGS: The heart size and mediastinal contours are within normal limits. Unchanged elevation of the right hemidiaphragm with associated atelectasis or consolidation. The visualized skeletal structures are unremarkable. IMPRESSION: Unchanged elevation of the right hemidiaphragm with associated atelectasis or consolidation. No acute abnormality of the lungs in AP portable projection. Electronically Signed   By: Eddie Candle M.D.   On: 12/17/2019  13:35   VAS [place] LOWER EXTREMITY VENOUS (DVT)  Result Date: 12/18/2019  Lower Venous DVTStudy Indications: Edema.  Risk Factors: None identified. Limitations: Body habitus and poor ultrasound/tissue interface. Comparison Study: No prior studies. Performing Technologist: Oliver Hum RVT  Examination Guidelines: A complete evaluation includes B-mode imaging, spectral Doppler, color Doppler, and power Doppler as needed of all accessible portions of each vessel. Bilateral testing is considered an integral part of a complete examination. Limited examinations for reoccurring indications may be performed as noted. The reflux portion of the exam is performed with the patient in reverse Trendelenburg.  +-----+---------------+---------+-----------+----------+--------------+ RIGHTCompressibilityPhasicitySpontaneityPropertiesThrombus Aging +-----+---------------+---------+-----------+----------+--------------+ CFV  Full           Yes      Yes                                 +-----+---------------+---------+-----------+----------+--------------+   +---------+---------------+---------+-----------+----------+--------------+ LEFT     CompressibilityPhasicitySpontaneityPropertiesThrombus Aging +---------+---------------+---------+-----------+----------+--------------+ CFV      Full           Yes      Yes                                 +---------+---------------+---------+-----------+----------+--------------+ SFJ      Full                                                        +---------+---------------+---------+-----------+----------+--------------+ FV Prox  Full                                                        +---------+---------------+---------+-----------+----------+--------------+ FV Mid   Full                                                        +---------+---------------+---------+-----------+----------+--------------+ FV DistalFull                                                         +---------+---------------+---------+-----------+----------+--------------+ PFV      Full                                                        +---------+---------------+---------+-----------+----------+--------------+  POP      Full           Yes      Yes                                 +---------+---------------+---------+-----------+----------+--------------+ PTV      Full                                                        +---------+---------------+---------+-----------+----------+--------------+ PERO     Full                                                        +---------+---------------+---------+-----------+----------+--------------+     Summary: RIGHT: - No evidence of common femoral vein obstruction.  LEFT: - There is no evidence of deep vein thrombosis in the lower extremity. However, portions of this examination were limited- see technologist comments above.  - No cystic structure found in the popliteal fossa.  *See table(s) above for measurements and observations. Electronically signed by Monica Martinez MD on 12/18/2019 at 12:57:24 PM.    Final    [place] EKG SITE RITE  Result Date: 12/17/2019 If Site Rite image not attached, placement could not be confirmed due to current cardiac rhythm.    CT HEAD WO CONTRAST  Result Date: 01/05/2020 CLINICAL DATA:  Intracranial hemorrhage EXAM: CT HEAD WITHOUT CONTRAST TECHNIQUE: Contiguous axial images were obtained from the base of the skull through the vertex without intravenous contrast. COMPARISON:  None. FINDINGS: Brain: Cortical hyperdensity within the right temporal lobe is unchanged from the earlier scan. This is in the region of encephalomalacia related prior MCA territory infarct. There is mild volume loss in generalized pattern. Vascular: No abnormal hyperdensity of the major intracranial arteries or dural venous sinuses. No intracranial atherosclerosis. Skull: The visualized skull  base, calvarium and extracranial soft tissues are normal. Sinuses/Orbits: No fluid levels or advanced mucosal thickening of the visualized paranasal sinuses. No mastoid or middle ear effusion. The orbits are normal. IMPRESSION: 1. Hyperdensity over the right temporal lobe is likely cortical laminar necrosis rather than acute hemorrhage. 2. Unchanged head CT. These results were called by telephone at the time of interpretation on 12/28/2019 at 8:23 pm to provider Samara Snide , who verbally acknowledged these results. Electronically Signed   By: Ulyses Jarred M.D.   On: 12/30/2019 20:23   CT Head Wo Contrast  Result Date: 01/06/2020 CLINICAL DATA:  Altered mental status. EXAM: CT HEAD WITHOUT CONTRAST TECHNIQUE: Contiguous axial images were obtained from the base of the skull through the vertex without intravenous contrast. COMPARISON:  Head CT and brain MR dated 10/25/2019 FINDINGS: Brain: Again demonstrated is low density in a right middle cerebral artery distribution with interval linear high density components measuring up to 88 Hounsfield units in density. No mass effect. Interval small area of low density in the left frontal white matter in an area of previously restricted diffusion on the MR. Stable mildly enlarged ventricles and cortical sulci. Vascular: No hyperdense vessel or unexpected calcification. Skull: Normal. Negative for fracture or focal lesion. Sinuses/Orbits: Unremarkable. Other: None. IMPRESSION:  1. Interval hemorrhage within the right middle cerebral artery distribution infarct without mass effect. 2. Previously demonstrated left frontal lobe white matter infarct. 3. Stable mild diffuse cerebral and cerebellar atrophy. Critical Value/emergent results were called by telephone at the time of interpretation on 01/07/2020 at 2:56 pm to provider [facility] RAY , who verbally acknowledged these results. Electronically Signed   By: Claudie Revering M.D.   On: 12/29/2019 14:57   MR BRAIN WO  CONTRAST  Result Date: 12/25/2019 CLINICAL DATA:  Follow-up examination for stroke, calcification versus hemorrhage. EXAM: MRI HEAD WITHOUT CONTRAST TECHNIQUE: Multiplanar, multiecho pulse sequences of the brain and surrounding structures were obtained without intravenous contrast. COMPARISON:  Prior CT from earlier same day as well as previous MRI from 10/25/2019. FINDINGS: Brain: Generalized age-related cerebral atrophy. Mild chronic microvascular ischemic disease again noted within the supratentorial cerebral white matter. There has been interval evolution of recently identified infarcts involving the right temporal and parietal lobes as well as the left centrum semi ovale. Overall, distribution is relatively unchanged. Scattered areas of susceptibility artifact at the right temporal lobe corresponds with hyperdensity seen on prior CT. 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 No evidence for hemorrhagic transformation or other complication. No other evidence for new or interval infarction. No acute intracranial hemorrhage. Additional single punctate chronic microhemorrhage noted at the right lentiform nucleus. No mass lesion, midline shift or mass effect. Ventricles normal size without hydrocephalus. No extra-axial fluid collection. Incidental note made of a partially empty sella. Midline structures intact. Vascular: Major intracranial vascular flow voids are maintained. Skull and upper cervical spine: Craniocervical junction normal. Bone marrow signal intensity within normal limits. No scalp soft tissue abnormality. Sinuses/Orbits: Globes and orbital soft tissues within normal limits. Mild scattered mucosal thickening noted within the maxillary sinuses bilaterally, right greater than left. Small bilateral mastoid effusions noted. Visualized nasopharynx within  normal limits. Other: None. IMPRESSION: 1. Normal expected interval evolution of subacute cortical and subcortical infarcts involving the bilateral cerebral hemispheres. 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 No evidence for hemorrhagic transformation. 2. No other new acute intracranial abnormality. 3. Underlying atrophy with chronic small vessel ischemic disease, stable. Electronically Signed   By: Jeannine Boga M.D.   On: 12/27/2019 22:46   IR REMOVAL TUN ACCESS W/ PORT W/O FL MOD SED  Result Date: 12/15/2019 INDICATION: 54 year old female with a history of right port catheter infection referred for removal EXAM: REMOVAL RIGHT IJ VEIN PORT-A-CATH MEDICATIONS: 2 g Ancef; The antibiotic was administered within an appropriate time interval prior to skin puncture. ANESTHESIA/SEDATION: Moderate (conscious) sedation was employed during this procedure. A total of Versed 2.0 mg and Fentanyl 100 mcg was administered intravenously. Moderate Sedation Time: 25 minutes. The patient's level of consciousness and vital signs were monitored continuously by radiology nursing throughout the procedure under my direct supervision. FLUOROSCOPY TIME:  None COMPLICATIONS: None PROCEDURE: Informed consent was obtained from the patient following an explanation of the procedure, risks, benefits and alternatives. The patient understands, agrees and consents for the procedure. All questions were addressed. A time out was performed prior to the initiation of the procedure. The patient was positioned in the operation suite in the supine position on a gantry. Visual inspection demonstrates no redness at the site with no ballotable fluid collection. No purulence. The previous scar on the right  chest was generously infiltrated with 1% lidocaine for local anesthesia. Infiltration of the skin and subcutaneous  tissues surrounding the port was performed. Using sharp and blunt dissection, the double-lumen port apparatus and subcutaneous catheter were removed in their entirety. No purulence within the pocket which was in good repair. Generous irrigation was performed. The port pocket was then closed with interrupted Vicryl layer and a running subcuticular with 4-0 Monocryl. The skin was sealed with Derma bond. A sterile dressing was placed. Tip culture was sent. The patient tolerated the procedure well and remained hemodynamically stable throughout. No complications were encountered and no significant blood loss was encountered. IMPRESSION: Status post right IJ port catheter removal. Signed, Dulcy Fanny. Dellia Nims, RPVI [facility] Radiology Electronically Signed   By: Corrie Mckusick D.O.   On: 12/15/2019 16:36   DG Chest Port 1 View  Result Date: 01/14/2020 CLINICAL DATA:  Hypotension. PICC line EXAM: PORTABLE CHEST 1 VIEW COMPARISON:  12/17/2019 FINDINGS: Interval PICC with its tip in the inferior aspect of the superior vena cava approximately 1.2 cm above the superior cavoatrial junction. Normal sized heart. Clear lungs with normal vascularity. Unremarkable bones. IMPRESSION: 1. Interval PICC with its tip in the inferior aspect of the superior vena cava approximately 1.2 cm above the superior cavoatrial junction. 2. No acute abnormality. Electronically Signed   By: Claudie Revering M.D.   On: 01/04/2020 14:42   DG CHEST PORT 1 VIEW  Result Date: 12/17/2019 CLINICAL DATA:  Cough EXAM: PORTABLE CHEST 1 VIEW COMPARISON:  12/10/2019 FINDINGS: The heart size and mediastinal contours are within normal limits. Unchanged elevation of the right hemidiaphragm with associated atelectasis or consolidation. The visualized skeletal structures are unremarkable. IMPRESSION: Unchanged elevation of the right hemidiaphragm with associated atelectasis or consolidation. No acute abnormality of  the lungs in AP portable projection. Electronically Signed   By: Eddie Candle M.D.   On: 12/17/2019 13:35   VAS [place] LOWER EXTREMITY VENOUS (DVT)  Result Date: 12/18/2019  Lower Venous DVTStudy Indications: Edema.  Risk Factors: None identified. Limitations: Body habitus and poor ultrasound/tissue interface. Comparison Study: No prior studies. Performing Technologist: Oliver Hum RVT  Examination Guidelines: A complete evaluation includes B-mode imaging, spectral Doppler, color Doppler, and power Doppler as needed of all accessible portions of each vessel. Bilateral testing is considered an integral part of a complete examination. Limited examinations for reoccurring indications may be performed as noted. The reflux portion of the exam is performed with the patient in reverse Trendelenburg.  +-----+---------------+---------+-----------+----------+--------------+ RIGHTCompressibilityPhasicitySpontaneityPropertiesThrombus Aging +-----+---------------+---------+-----------+----------+--------------+ CFV  Full           Yes      Yes                                 +-----+---------------+---------+-----------+----------+--------------+   +---------+---------------+---------+-----------+----------+--------------+ LEFT     CompressibilityPhasicitySpontaneityPropertiesThrombus Aging +---------+---------------+---------+-----------+----------+--------------+ CFV      Full           Yes      Yes                                 +---------+---------------+---------+-----------+----------+--------------+ SFJ      Full                                                        +---------+---------------+---------+-----------+----------+--------------+  FV Prox  Full                                                        +---------+---------------+---------+-----------+----------+--------------+ FV Mid   Full                                                         +---------+---------------+---------+-----------+----------+--------------+ FV DistalFull                                                        +---------+---------------+---------+-----------+----------+--------------+ PFV      Full                                                        +---------+---------------+---------+-----------+----------+--------------+ POP      Full           Yes      Yes                                 +---------+---------------+---------+-----------+----------+--------------+ PTV      Full                                                        +---------+---------------+---------+-----------+----------+--------------+ PERO     Full                                                        +---------+---------------+---------+-----------+----------+--------------+     Summary: RIGHT: - No evidence of common femoral vein obstruction.  LEFT: - There is no evidence of deep vein thrombosis in the lower extremity. However, portions of this examination were limited- see technologist comments above.  - No cystic structure found in the popliteal fossa.  *See table(s) above for measurements and observations. Electronically signed by Monica Martinez MD on 12/18/2019 at 12:57:24 PM.    Final    [place] EKG SITE RITE  Result Date: 12/17/2019 If Site Rite image not attached, placement could not be confirmed due to current cardiac rhythm.   Assessment and Plan:  EMANIE VENTRONE 54 y.o. female with medical history significant for metastatic cholangiocarcinoma presents for a follow up visit.  After review of the labs, review the outside records, and discussion with the patient her findings most consistent with metastatic cholangiocarcinoma.  At this time it is unclear how she is responded to her initial round of gemcitabine cisplatin therapy as she has not received restaging scans.  The  last scans we have on record are from December 2020.  As such I would recommend  that before proceeding with treatment we obtain a new restaging scans with a CT chest abdomen and pelvis.  Once review the scans we can determine the treatment course moving forward.  The patient is now admitted for lethargy and hypotension.  There was initial concern for hemorrhagic conversion of her left MCA infarct with this was ruled out with MRI.  She continues on Eliquis.  She has not had her restaging CT scans of the chest, abdomen, pelvis.  Recommend obtaining these while she is admitted and once her nausea and vomiting have improved. If she is is responding well to therapy I think we can consider gem cis therapy at a reduced dose given her poor overall functional status.  In the event the progression of tumor is seen I do think that palliative care is most appropriate and I do not recommend any further treatment with palliative chemotherapy.  At this time the patient is reluctant to consider comfort based care or hospice given her young age and strong faith.  We will have the patient return to clinic once she has been cleared and evaluated by infectious disease and additionally once the scans have returned.  #Metastatic Intrahepatic Cholangiocarcinoma, Stage IV --patient is currently recovering from an episode of sepsis with bacteremia. Her blood counts are poor and her nutritional status has declined. Additionally she has become deconditioned since our first meeting in clinic --patient has completed 2 cycles of gem/cis, but only received Day 1 of Cycle 3.  --recommend restaging scans of the chest, abdomen, pelvis with contrast once her nausea and vomiting have improved. If patient is having reduction in size of tumor can consider additional cycles. If progression is noted I would recommend consideration of hospice care --patient is hesitant to consider hospice, despite her marked decline. She continues to struggle with her diagnosis and prognosis --We will arrange for outpatient follow-up  after discharge  # R MCA infarct --no evidence of hemorrhagic transformation --Continue Eliquis  #Poor Appetite #Weight Loss --recommend mirtazepine 7.5mg  nightly   #Nausea --continue zofran and reglan  #Pain Control --patient currently in 0/10 pain --continue to monitor    Mikey Bussing, DNP, AGPCNP-BC, AOCNP

## 2020-01-14 ENCOUNTER — Ambulatory Visit: Payer: BC Managed Care – PPO | Admitting: Physician Assistant

## 2020-01-14 LAB — BASIC METABOLIC PANEL
Anion gap: 6 (ref 5–15)
Anion gap: 10 (ref 5–15)
BUN: 13 mg/dL (ref 6–20)
BUN: 14 mg/dL (ref 6–20)
CO2: 18 mmol/L — ABNORMAL LOW (ref 22–32)
CO2: 18 mmol/L — ABNORMAL LOW (ref 22–32)
Calcium: 6.4 mg/dL — CL (ref 8.9–10.3)
Calcium: 6.6 mg/dL — ABNORMAL LOW (ref 8.9–10.3)
Chloride: 115 mmol/L — ABNORMAL HIGH (ref 98–111)
Chloride: 112 mmol/L — ABNORMAL HIGH (ref 98–111)
Creatinine, Ser: 1.18 mg/dL — ABNORMAL HIGH (ref 0.44–1.00)
Creatinine, Ser: 1.3 mg/dL — ABNORMAL HIGH (ref 0.44–1.00)
GFR calc Af Amer: >60 mL/min (ref >60)
GFR calc Af Amer: 55 mL/min — ABNORMAL LOW (ref >60)
GFR calc non Af Amer: 53 mL/min — ABNORMAL LOW (ref >60)
GFR calc non Af Amer: 47 mL/min — ABNORMAL LOW (ref >60)
Glucose, Bld: 108 mg/dL — ABNORMAL HIGH (ref 70–99)
Glucose, Bld: 104 mg/dL — ABNORMAL HIGH (ref 70–99)
Potassium: 3 mmol/L — ABNORMAL LOW (ref 3.5–5.1)
Potassium: 3.8 mmol/L (ref 3.5–5.1)
Sodium: 139 mmol/L (ref 135–145)
Sodium: 140 mmol/L (ref 135–145)

## 2020-01-14 LAB — URINE CULTURE: Culture: NO GROWTH

## 2020-01-14 LAB — CBC
HCT: 25.1 % — ABNORMAL LOW (ref 36.0–46.0)
Hemoglobin: 7.6 g/dL — ABNORMAL LOW (ref 12.0–15.0)
MCH: 26.3 pg (ref 26.0–34.0)
MCHC: 30.3 g/dL (ref 30.0–36.0)
MCV: 86.9 fL (ref 80.0–100.0)
Platelets: 95 10*3/uL — ABNORMAL LOW (ref 150–400)
RBC: 2.89 MIL/uL — ABNORMAL LOW (ref 3.87–5.11)
RDW: 17.6 % — ABNORMAL HIGH (ref 11.5–15.5)
WBC: 10.9 10*3/uL — ABNORMAL HIGH (ref 4.0–10.5)
nRBC: 0 % (ref 0.0–0.2)

## 2020-01-14 LAB — MAGNESIUM: Magnesium: 2.1 mg/dL (ref 1.7–2.4)

## 2020-01-14 LAB — PHOSPHORUS: Phosphorus: 2.5 mg/dL (ref 2.5–4.6)

## 2020-01-14 MED ORDER — PRO-STAT SUGAR FREE PO LIQD
30.0000 mL | Freq: Two times a day (BID) | ORAL | Status: DC
  Administered 2020-01-15 – 2020-01-16 (×2): 30 mL via ORAL
  Filled 2020-01-14 (×4): qty 30

## 2020-01-14 MED ORDER — MIRTAZAPINE 15 MG PO TABS
7.5000 mg | ORAL_TABLET | Freq: Every day | ORAL | Status: DC
  Administered 2020-01-14 – 2020-01-15 (×2): 7.5 mg via ORAL
  Filled 2020-01-14 (×3): qty 1

## 2020-01-14 MED ORDER — POTASSIUM CHLORIDE 10 MEQ/50ML IV SOLN
10.0000 meq | INTRAVENOUS | Status: AC
  Administered 2020-01-14 (×6): 10 meq via INTRAVENOUS
  Filled 2020-01-14 (×6): qty 50

## 2020-01-14 MED ORDER — ACETAMINOPHEN 10 MG/ML IV SOLN: 1000.0000 mg | Freq: Once | INTRAVENOUS | Status: DC

## 2020-01-14 MED ORDER — PANTOPRAZOLE SODIUM 40 MG PO TBEC
40.0000 mg | DELAYED_RELEASE_TABLET | Freq: Every day | ORAL | Status: DC
  Administered 2020-01-14 – 2020-01-15 (×2): 40 mg via ORAL
  Filled 2020-01-14 (×3): qty 1

## 2020-01-14 MED ORDER — LORAZEPAM 2 MG/ML IJ SOLN
0.5000 mg | Freq: Three times a day (TID) | INTRAMUSCULAR | Status: DC | PRN
  Administered 2020-01-14 – 2020-01-16 (×6): 0.5 mg via INTRAVENOUS
  Filled 2020-01-14 (×6): qty 1

## 2020-01-14 MED ORDER — CALCIUM GLUCONATE-NACL 1-0.675 GM/50ML-% IV SOLN
1.0000 g | Freq: Once | INTRAVENOUS | Status: AC
  Administered 2020-01-14: 1000 mg via INTRAVENOUS
  Filled 2020-01-14: qty 50

## 2020-01-14 NOTE — Progress Notes (Signed)
eLink Physician-Brief Progress Note Patient Name: Melissa Hurley DOB: 28-Jan-1966 MRN: [id number]   Date of Service  01/14/2020  HPI/Events of Note  Mild hypocalcemia after correction for Albumin.  eICU Interventions  Calcium gluconate 1 gm iv bolus.        Kerry Kass Edilberto Roosevelt 01/14/2020, 6:57 AM

## 2020-01-14 NOTE — Progress Notes (Signed)
eLink Physician-Brief Progress Note Patient Name: Melissa Hurley DOB: July 22, 1966 MRN: [id number]   Date of Service  01/14/2020  HPI/Events of Note  Pt is on home Ativan for anxiety and requests that she be put on it here in the hospital.  eICU Interventions  Ativan 0.5 mg po tid PRN ordered.        Kerry Kass Mariaceleste Herrera 01/14/2020, 3:06 AM

## 2020-01-14 NOTE — Progress Notes (Signed)
PROGRESS NOTE    Melissa Hurley  [id number] DOB: Jun 16, 1966 DOA: 01/01/2020 PCP: Patient, No Pcp Per    Brief Narrative:  54 year old female with history of recently diagnosed metastatic cholangiocarcinoma on chemotherapy, recent bacteremia with Corynebacterium in the setting of Port-A-Cath infection that was removed and treated with vancomycin.  Left MCA CVA in March 2021 and currently on Eliquis.  She was following up at ID office on 5/26 and was found to be hypotensive and lethargic and sent to the ER.  Initial CT scan of the head suspected of hemorrhagic conversion of right MCA infarct so patient was admitted to neuro ICU. 5/26, admitted to neuro ICU from [facility] emergency room. CT head, 5/26 possible interval hemorrhage right MCA artery distribution infarct without mass-effect MRI of the brain 5/26, expected interval evolution of subacute cortical and subcortical infarcts involving bilateral cerebral hemispheres. Cultures negative.  Assessment & Plan:   Active Problems:   Intracerebral hemorrhage (HCC)  Hypotension: From hypovolemia.  No evidence of sepsis.  Treated with maintenance IV fluids with improvement.  Cultures are negative.  PICC line to remain as she is potential chemotherapy candidate.  Recent history of left MCA stroke with suspected hemorrhagic conversion: MRI negative for hemorrhagic conversion.  Eliquis resumed.  Seen by neurology and signed off.  Work with PT OT today.  Metastatic cholangiocarcinoma: Status post 3 cycles of chemotherapy.  Followed by oncology.  Staging CT scans today.  Further management or palliative referral as per oncology.  Anemia, thrombocytopenia in the setting of malignancy: Fairly stable.  Hypokalemia: Replaced aggressively with improvement.  Hypocalcemia: Corrected calcium more than 8.  No indication for replacement.   DVT prophylaxis: SCD, now on Eliquis Code Status: Full code Family Communication: None at  bedside Disposition Plan: Status is: Inpatient  Remains inpatient appropriate because:Getting CT scans, staging of cancer and prognostication discussion.   Dispo: The patient is from: Home              Anticipated d/c is to: Home              Anticipated d/c date is: 2 days              Patient currently is not medically stable to d/c.  Patient is stabilizing.  Oncology is following.  They will discuss prognosis with patient after CT scans.  Depends upon patient's further oncology plan, probable home with home health.          Consultants:   Oncology  Neurology  PCCM  Procedures:   None  Antimicrobials:  Antibiotics Given (last 72 hours)    Date/Time Action Medication Dose Rate   01/05/2020 1434 New Bag/Given   ceFEPIme (MAXIPIME) 2 g in sodium chloride 0.9 % 100 mL IVPB 2 g 200 mL/hr   12/23/2019 1434 New Bag/Given   metroNIDAZOLE (FLAGYL) IVPB 500 mg 500 mg 100 mL/hr   01/02/2020 1526 New Bag/Given   vancomycin (VANCOREADY) IVPB 2000 mg/400 mL 2,000 mg 200 mL/hr         Subjective: Patient seen and examined.  Denies any nausea or vomiting.  She feels slightly shaky.  Had poor sleep at night otherwise denies any complaints.  She has not work with PT OT yet.  Objective: Vitals:   01/14/20 0400 01/14/20 0500 01/14/20 0600 01/14/20 0800  BP: 91/70   (!) 127/42  Pulse: 86 84 83 100  Resp: (!) 25 (!) 21 20 (!) 21  Temp: 98.1 F (36.7 C)   98.8  F (37.1 C)  TempSrc: Oral   Oral  SpO2: 100% 100% 100% 100%  Weight:      Height:        Intake/Output Summary (Last 24 hours) at 01/14/2020 1057 Last data filed at 01/14/2020 0800 Gross per 24 hour  Intake 1678.51 ml  Output 200 ml  Net 1478.51 ml   Filed Weights   12/25/2019 2020  Weight: 93.8 kg    Examination:  General exam: Appears calm and comfortable, chronically sick looking.  Not in any distress. Respiratory system: Clear to auscultation. Respiratory effort normal. Cardiovascular system: S1 & S2 heard,  RRR. No JVD, murmurs, rubs, gallops or clicks. No pedal edema. Gastrointestinal system: Abdomen is nondistended, soft and nontender.  Central nervous system: Alert and oriented. No focal neurological deficits. Extremities: Symmetric 5 x 5 power. Skin: No rashes, lesions or ulcers. Psychiatry: Judgement and insight appear normal. Mood & affect flat.    Data Reviewed: I have personally reviewed following labs and imaging studies  CBC: Recent Labs  Lab 01/04/2020 1348 01/11/2020 2222 01/13/20 0503 01/14/20 0507  WBC 11.2*  --  10.2 10.9*  NEUTROABS 7.9*  --   --   --   HGB 6.9* 8.2* 8.1* 7.6*  HCT 23.6* 27.3* 26.8* 25.1*  MCV 87.7  --  86.2 86.9  PLT 138*  --  105* 95*   Basic Metabolic Panel: Recent Labs  Lab 12/22/2019 1348 01/13/20 0503 01/13/20 1853 01/14/20 0507  NA 141 142 141 139  K 3.3* 2.9* 4.0 3.0*  CL 110 109 113* 115*  CO2 22 19* 20* 18*  GLUCOSE 101* 77 85 108*  BUN 16 13 11 13   CREATININE 1.28* 1.15* 1.29* 1.18*  CALCIUM 6.3* 6.2* 6.4* 6.4*  MG 1.1*  --   --  2.1  PHOS  --   --  2.5 2.5   GFR: Estimated Creatinine Clearance: 65.6 mL/min (A) (by C-G formula based on SCr of 1.18 mg/dL (H)). Liver Function Tests: Recent Labs  Lab 01/09/2020 1348 01/13/20 0503  AST 33 25  ALT 16 13  ALKPHOS 246* 215*  BILITOT 1.3* 1.1  PROT 5.7* 5.1*  ALBUMIN 1.9* 1.4*   No results for input(s): LIPASE, AMYLASE in the last 168 hours. Recent Labs  Lab 12/28/2019 1348  AMMONIA 25   Coagulation Profile: Recent Labs  Lab 01/11/2020 1348  INR 3.0*   Cardiac Enzymes: No results for input(s): CKTOTAL, CKMB, CKMBINDEX, TROPONINI in the last 168 hours. BNP (last 3 results) No results for input(s): PROBNP in the last 8760 hours. HbA1C: No results for input(s): HGBA1C in the last 72 hours. CBG: No results for input(s): GLUCAP in the last 168 hours. Lipid Profile: No results for input(s): CHOL, HDL, LDLCALC, TRIG, CHOLHDL, LDLDIRECT in the last 72 hours. Thyroid Function  Tests: No results for input(s): TSH, T4TOTAL, FREET4, T3FREE, THYROIDAB in the last 72 hours. Anemia Panel: No results for input(s): VITAMINB12, FOLATE, FERRITIN, TIBC, IRON, RETICCTPCT in the last 72 hours. Sepsis Labs: Recent Labs  Lab 12/21/2019 1348 01/01/2020 1735  LATICACIDVEN 3.0* 3.5*    Recent Results (from the past 240 hour(s))  Urine culture     Status: None   Collection Time: 12/21/2019 12:56 PM   Specimen: In/Out Cath Urine  Result Value Ref Range Status   Specimen Description   Final    IN/OUT CATH URINE Performed at [facility], [place] [address], [place],  [address]    Special Requests   Final  NONE Performed at [facility], [place] [address], [place], [place] [address]    Culture   Final    NO GROWTH Performed at [facility], [place] [address] [place], [place] [address]    Report Status 01/14/2020 FINAL  Final  SARS Coronavirus 2 by RT PCR (hospital order, performed in [facility] hospital lab) Nasopharyngeal Nasopharyngeal Swab     Status: None   Collection Time: 01/13/2020  1:48 PM   Specimen: Nasopharyngeal Swab  Result Value Ref Range Status   SARS Coronavirus 2 NEGATIVE NEGATIVE Final    Comment: (NOTE) SARS-CoV-2 target nucleic acids are NOT DETECTED. The SARS-CoV-2 RNA is generally detectable in upper and lower respiratory specimens during the acute phase of infection. The lowest concentration of SARS-CoV-2 viral copies this assay can detect is 250 copies / mL. A negative result does not preclude SARS-CoV-2 infection and should not be used as the sole basis for treatment or other patient management decisions.  A negative result may occur with improper specimen collection / handling, submission of specimen other than nasopharyngeal swab, presence of viral mutation(s) within the areas targeted by this assay, and inadequate number of viral copies (<250 copies / mL). A negative result must be combined with  clinical observations, patient history, and epidemiological information. Fact Sheet for Patients:   StrictlyIdeas.no Fact Sheet for Healthcare Providers: BankingDealers.co.za This test is not yet approved or cleared  by the [place] FDA and has been authorized for detection and/or diagnosis of SARS-CoV-2 by FDA under an Emergency Use Authorization (EUA).  This EUA will remain in effect (meaning this test can be used) for the duration of the COVID-19 declaration under Section 564(b)(1) of the Act, 21 U.S.C. section 360bbb-3(b)(1), unless the authorization is terminated or revoked sooner. Performed at [facility], [place] [address] [place], [place] [address]   Blood Culture (routine x 2)     Status: None (Preliminary result)   Collection Time: 12/29/2019  2:00 PM   Specimen: BLOOD  Result Value Ref Range Status   Specimen Description BLOOD SITE NOT SPECIFIED  Final   Special Requests   Final    BOTTLES DRAWN AEROBIC AND ANAEROBIC Blood Culture adequate volume Performed at [place] [address], [place], [place] [address]    Culture   Final    NO GROWTH 1 DAY Performed at [facility], [place] [address], [place], [place] [address]    Report Status PENDING  Incomplete  MRSA PCR Screening     Status: None   Collection Time: 01/16/2020  8:40 PM   Specimen: Nasal Mucosa; Nasopharyngeal  Result Value Ref Range Status   MRSA by PCR NEGATIVE NEGATIVE Final    Comment:        The GeneXpert MRSA Assay (FDA approved for NASAL specimens only), is one component of a comprehensive MRSA colonization surveillance program. It is not intended to diagnose MRSA infection nor to guide or monitor treatment for MRSA infections. Performed at [facility], [place] [address] [place],  [address]          Radiology Studies: CT HEAD WO CONTRAST  Result Date: 01/04/2020 CLINICAL DATA:   Intracranial hemorrhage EXAM: CT HEAD WITHOUT CONTRAST TECHNIQUE: Contiguous axial images were obtained from the base of the skull through the vertex without intravenous contrast. COMPARISON:  None. FINDINGS: Brain: Cortical hyperdensity within the right temporal lobe is unchanged from the earlier scan. This is in the region  of encephalomalacia related prior MCA territory infarct. There is mild volume loss in generalized pattern. Vascular: No abnormal hyperdensity of the major intracranial arteries or dural venous sinuses. No intracranial atherosclerosis. Skull: The visualized skull base, calvarium and extracranial soft tissues are normal. Sinuses/Orbits: No fluid levels or advanced mucosal thickening of the visualized paranasal sinuses. No mastoid or middle ear effusion. The orbits are normal. IMPRESSION: 1. Hyperdensity over the right temporal lobe is likely cortical laminar necrosis rather than acute hemorrhage. 2. Unchanged head CT. These results were called by telephone at the time of interpretation on 12/28/2019 at 8:23 pm to provider Samara Snide , who verbally acknowledged these results. Electronically Signed   By: Ulyses Jarred M.D.   On: 01/04/2020 20:23   CT Head Wo Contrast  Result Date: 01/07/2020 CLINICAL DATA:  Altered mental status. EXAM: CT HEAD WITHOUT CONTRAST TECHNIQUE: Contiguous axial images were obtained from the base of the skull through the vertex without intravenous contrast. COMPARISON:  Head CT and brain MR dated 10/25/2019 FINDINGS: Brain: Again demonstrated is low density in a right middle cerebral artery distribution with interval linear high density components measuring up to 88 Hounsfield units in density. No mass effect. Interval small area of low density in the left frontal white matter in an area of previously restricted diffusion on the MR. Stable mildly enlarged ventricles and cortical sulci. Vascular: No hyperdense vessel or unexpected calcification. Skull: Normal.  Negative for fracture or focal lesion. Sinuses/Orbits: Unremarkable. Other: None. IMPRESSION: 1. Interval hemorrhage within the right middle cerebral artery distribution infarct without mass effect. 2. Previously demonstrated left frontal lobe white matter infarct. 3. Stable mild diffuse cerebral and cerebellar atrophy. Critical Value/emergent results were called by telephone at the time of interpretation on 12/28/2019 at 2:56 pm to provider [facility] , who verbally acknowledged these results. Electronically Signed   By: Claudie Revering M.D.   On: 01/15/2020 14:57   MR BRAIN WO CONTRAST  Result Date: 01/17/2020 CLINICAL DATA:  Follow-up examination for stroke, calcification versus hemorrhage. EXAM: MRI HEAD WITHOUT CONTRAST TECHNIQUE: Multiplanar, multiecho pulse sequences of the brain and surrounding structures were obtained without intravenous contrast. COMPARISON:  Prior CT from earlier same day as well as previous MRI from 10/25/2019. FINDINGS: Brain: Generalized age-related cerebral atrophy. Mild chronic microvascular ischemic disease again noted within the supratentorial cerebral white matter. There has been interval evolution of recently identified infarcts involving the right temporal and parietal lobes as well as the left centrum semi ovale. Overall, distribution is relatively unchanged. Scattered areas of susceptibility artifact at the right temporal lobe corresponds with hyperdensity seen on prior CT. 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 No evidence for hemorrhagic transformation or other complication. No other evidence for new or interval infarction. No acute intracranial hemorrhage. Additional single punctate chronic microhemorrhage noted at the right lentiform nucleus. No mass lesion, midline shift or mass effect. Ventricles normal size without  hydrocephalus. No extra-axial fluid collection. Incidental note made of a partially empty sella. Midline structures intact. Vascular: Major intracranial vascular flow voids are maintained. Skull and upper cervical spine: Craniocervical junction normal. Bone marrow signal intensity within normal limits. No scalp soft tissue abnormality. Sinuses/Orbits: Globes and orbital soft tissues within normal limits. Mild scattered mucosal thickening noted within the maxillary sinuses bilaterally, right greater than left. Small bilateral mastoid effusions noted. Visualized nasopharynx within normal limits. Other: None. IMPRESSION: 1. Normal expected  interval evolution of subacute cortical and subcortical infarcts involving the bilateral cerebral hemispheres. 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 No evidence for hemorrhagic transformation. 2. No other new acute intracranial abnormality. 3. Underlying atrophy with chronic small vessel ischemic disease, stable. Electronically Signed   By: Jeannine Boga M.D.   On: 12/23/2019 22:46   DG Chest Port 1 View  Result Date: 01/05/2020 CLINICAL DATA:  Hypotension. PICC line EXAM: PORTABLE CHEST 1 VIEW COMPARISON:  12/17/2019 FINDINGS: Interval PICC with its tip in the inferior aspect of the superior vena cava approximately 1.2 cm above the superior cavoatrial junction. Normal sized heart. Clear lungs with normal vascularity. Unremarkable bones. IMPRESSION: 1. Interval PICC with its tip in the inferior aspect of the superior vena cava approximately 1.2 cm above the superior cavoatrial junction. 2. No acute abnormality. Electronically Signed   By: Claudie Revering M.D.   On: 01/05/2020 14:42   DG UGI W SINGLE CM (SOL OR THIN BA)  Result Date: 01/13/2020 CLINICAL DATA:  Nausea and vomiting.  Metastatic cholangiocarcinoma. EXAM: UPPER GI SERIES WITHOUT  KUB TECHNIQUE: Routine upper GI series was performed with high-density barium. FLUOROSCOPY TIME:  Fluoroscopy Time:  2.7 minutes COMPARISON:  CT scan of the chest dated 09/22/2019 and CT scan of the abdomen and pelvis dated 07/30/2019 FINDINGS: Because of the patient's severe nausea she had difficulty ingesting the barium. She did aspirate a small amount of the thick barium upon initial swallow. She was able to cough and remove the small amount of aspirated barium. There is no evidence of esophageal mass or stricture. There is a 5 cm hiatal hernia with edema in the mucosa of hiatal hernia. No appreciable stricture. Patient was only able to ingest 2 swallows of barium. However, the barium did pass into the stomach. The contrast passed into the nondistended duodenal bulb and nondistended C-loop and beyond the ligament of Treitz into the nondistended jejunum. There is no discrete mass in the stomach and there is no discrete ulcer. There is now a mass in the superior aspect of the left hilum silhouetting the left side of the arch of the aorta. There appears to be hyperinflation of right middle lobe suggesting that there may be a ball valve type obstruction of the right middle lobe bronchus. These findings are not apparent on the chest x-ray performed on 12/30/2019. IMPRESSION: 1. No evidence of obstruction of the esophagus or stomach or duodenum. 2. 5 cm hiatal hernia. 3. Slight edema in the mucosa of the hiatal hernia. 4. Patient aspirated a small amount of thick barium but was able to cough and expel the aspirated barium without difficulty. 5. Possible mass in the superior aspect of the left hilum. Possible hyperinflation of the right middle lobe. Electronically Signed   By: Lorriane Shire M.D.   On: 01/13/2020 16:26        Scheduled Meds: . apixaban  5 mg Oral BID  . Chlorhexidine Gluconate Cloth  6 each Topical Daily  . metoCLOPramide (REGLAN) injection  5 mg Intravenous Q8H  . metoprolol tartrate  12.5  mg Oral BID  . pantoprazole (PROTONIX) IV  40 mg Intravenous QHS  . senna-docusate  1 tablet Oral BID   Continuous Infusions: . sodium chloride 75 mL/hr at 01/14/20 0800  . potassium chloride 10 mEq (01/14/20 1040)     LOS: 2 days    Time spent: 30 minutes     Dante Gang  Sloan Leiter, MD [facility] Pager [phone]

## 2020-01-14 NOTE — Evaluation (Signed)
Physical Therapy Evaluation Patient Details Name: Melissa Hurley MRN: [id number] DOB: February 14, 1966 Today's Date: 01/14/2020   History of Present Illness  54 year old female with history of metastatic cholangiocarcinoma on chemotherapy, recent bacteremia in the setting of Port-A-Cath infection.  Left MCA CVA in March 2021 and currently on Eliquis.  She was following up at ID office on 5/26 and was found to be hypotensive and lethargic. Initial CT showing suspected hemorrhagic conversion of L MCA infarct. MRI showing expected interval evolution of subacute cortical and subcortical infarcts involving bilateral cerebral hemispheres.  Clinical Impression  Pt presents to PT with deficits in functional mobility, gait, balance, endurance, strength, power, and cognition. Pt is generally weak and fatigues rapidly with activity. Pt reports she is often cold and does demonstrate UE shivering but also may be trembling from fatigue and weakness. Pt requires physical assistance to perform all mobility due to weakness and falls risk. Pt will continue to benefit from acute PT POC to improve activity tolerance and reduce falls risk. PT currently recommend SNF however the pt expresses the desire to return home. If discharging home the patient will benefit from Babbie, a RW, and a transport chair, as well as assistance from family for all mobility.    Follow Up Recommendations Home health PT;Supervision/Assistance - 24 hour;SNF(PT recs SNF, pt desires to return home and will need HHPT. )    Equipment Recommendations  Rolling walker with 5" wheels(transport chair)    Recommendations for Other Services       Precautions / Restrictions Precautions Precautions: Fall Precaution Comments: pt reports sliding off edge of bed Restrictions Weight Bearing Restrictions: No      Mobility  Bed Mobility Overal bed mobility: Needs Assistance Bed Mobility: Rolling;Sidelying to Sit;Sit to Supine Rolling: Min guard Sidelying  to sit: Mod assist   Sit to supine: Mod assist      Transfers Overall transfer level: Needs assistance Equipment used: 1 person hand held assist Transfers: Sit to/from [employer] Transfers Sit to Stand: Mod assist Stand pivot transfers: Mod assist       General transfer comment: pt requiring assistance to power up to standing due to strength deficits. PT providing cues for hand placement to facilitate improved use of UEs  Ambulation/Gait Ambulation/Gait assistance: Mod assist Gait Distance (Feet): 4 Feet Assistive device: 1 person hand held assist Gait Pattern/deviations: Step-to pattern;Shuffle Gait velocity: Decreased Gait velocity interpretation: <1.31 ft/sec, indicative of household ambulator General Gait Details: short shuffling steps from bedside to recliner  Stairs            Wheelchair Mobility    Modified Rankin (Stroke Patients Only) Modified Rankin (Stroke Patients Only) Pre-Morbid Rankin Score: Moderate disability Modified Rankin: Moderately severe disability     Balance Overall balance assessment: Needs assistance Sitting-balance support: Feet supported;Single extremity supported Sitting balance-Leahy Scale: Poor Sitting balance - Comments: minA-minG at edge of bed   Standing balance support: Bilateral upper extremity supported Standing balance-Leahy Scale: Poor Standing balance comment: minA to maintain static standing with BUE support of PT                             Pertinent Vitals/Pain Pain Assessment: Faces Faces Pain Scale: Hurts even more Pain Location: generalized discomfort Pain Descriptors / Indicators: Grimacing Pain Intervention(s): Monitored during session    Home Living Family/patient expects to be discharged to:: Private residence Living Arrangements: Spouse/significant other;Children Available Help at Discharge: Family;Available PRN/intermittently Type of Home:  House Home Access: Stairs to  enter Entrance Stairs-Rails: Can reach both Entrance Stairs-Number of Steps: 3 Home Layout: Two level;Bed/bath upstairs;Able to live on main level with bedroom/bathroom Home Equipment: Bedside commode Additional Comments: lives with spouse, who recently started working from home, and has 27 y.o. daughter     Prior Function Level of Independence: Needs assistance   Gait / Transfers Assistance Needed: pt reports she has not been ambulating with AD, but spouse often has to assist her   ADL's / Homemaking Assistance Needed: Spouse assists her with most ADLs.  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      Hand Dominance   Dominant Hand: Right    Extremity/Trunk Assessment   Upper Extremity Assessment Upper Extremity Assessment: Generalized weakness    Lower Extremity Assessment Lower Extremity Assessment: Generalized weakness(BLE edema)    Cervical / Trunk Assessment Cervical / Trunk Assessment: Normal  Communication   Communication: Expressive difficulties  Cognition Arousal/Alertness: Awake/alert Behavior During Therapy: WFL for tasks assessed/performed;Anxious Overall Cognitive Status: Impaired/Different from baseline Area of Impairment: Attention;Problem solving                   Current Attention Level: Selective         Problem Solving: Difficulty sequencing;Requires verbal cues;Slow processing        General Comments General comments (skin integrity, edema, etc.): tachy tro 126 with mobility noted, although unable to contiuously visualize portable monitor durign session. Pt often shivering/trembling durign session with BUEs. Pt expresses goals to ambulate without help and to walk up stairs. Pt also asks for tips on how to get up stairs and how to get up from the floor after falling. Pt expresses great concern about fatigue    Exercises     Assessment/Plan    PT Assessment Patient needs continued PT services  PT Problem List  Decreased strength;Decreased activity tolerance;Decreased balance;Decreased mobility;Decreased cognition;Decreased knowledge of use of DME;Decreased safety awareness;Decreased knowledge of precautions;Cardiopulmonary status limiting activity       PT Treatment Interventions DME instruction;Gait training;Stair training;Functional mobility training;Therapeutic activities;Therapeutic exercise;Balance training;Neuromuscular re-education;Cognitive remediation;Patient/family education;Wheelchair mobility training    PT Goals (Current goals can be found in the Care Plan section)  Acute Rehab PT Goals Patient Stated Goal: to get stronger and improve activity tolerance PT Goal Formulation: With patient Time For Goal Achievement: 01/28/20 Potential to Achieve Goals: Fair    Frequency Min 3X/week   Barriers to discharge        Co-evaluation               AM-PAC PT "6 Clicks" Mobility  Outcome Measure Help needed turning from your back to your side while in a flat bed without using bedrails?: A Little Help needed moving from lying on your back to sitting on the side of a flat bed without using bedrails?: A Lot Help needed moving to and from a bed to a chair (including a wheelchair)?: A Lot Help needed standing up from a chair using your arms (e.g., wheelchair or bedside chair)?: A Lot Help needed to walk in hospital room?: A Lot Help needed climbing 3-5 steps with a railing? : Total 6 Click Score: 12    End of Session   Activity Tolerance: Patient limited by fatigue Patient left: in bed;with call bell/phone within reach;with bed alarm set Nurse Communication: Mobility status PT Visit Diagnosis: Other abnormalities of gait and mobility (R26.89);Repeated falls (R29.6);Muscle weakness (generalized) (M62.81);History of falling (Z91.81)  Time: [id number] PT Time Calculation (min) (ACUTE ONLY): 30 min   Charges:   PT Evaluation $PT Eval Moderate Complexity: 1 Mod PT  Treatments $Therapeutic Activity: 8-22 mins        Zenaida Niece, PT, DPT Acute Rehabilitation Pager: [phone]   Zenaida Niece 01/14/2020, 5:49 PM

## 2020-01-14 NOTE — Progress Notes (Addendum)
Initial Nutrition Assessment  DOCUMENTATION CODES:   Severe malnutrition in context of chronic illness  INTERVENTION:   Magic cup TID with meals, each supplement provides 290 kcal and 9 grams of protein  30 ml Prostat BID.  Liberalize diet to Regular; reviewed menu options.   Discussed impact of cancer and cancer related therapies on appetite and taste. Reviewed strategies to improve intake.   Discussed plan with RN, if additional swallowing difficulties seen recommend SLP consult.   NUTRITION DIAGNOSIS:   Severe Malnutrition related to chronic illness(metastatic cancer) as evidenced by energy intake < or equal to 75% for > or equal to 1 month, percent weight loss.  GOAL:   Patient will meet greater than or equal to 90% of their needs  MONITOR:   PO intake, Supplement acceptance  REASON FOR ASSESSMENT:   Malnutrition Screening Tool    ASSESSMENT:   Pt with PMH of cholangiocarcinoma s/p 2 cycles of chemo just starting 3rd and recent ischemic infarcts now admitted with somnolence and hypotension.   Pt discussed during ICU rounds and with RN.  Per neurology MRI shows laminar necrosis due to ongoing hypotension.  Per rounds plan for repeat CT to help determine course of care.   Spoke with pt and husband at bedside. They report pt has a lack of appetite, taste changes, problems chewing meat and pasta, weakness being unable to perform ADLs without help from husband.  24 hr recall reveals that pt's usual intake is some bites of fruit for breakfast and bites of fruit at dinner.  Pt complains of nausea and regurgitation after eating/drinking.  Pt does not like supplements. She has boost plus and boost breeze at home but does not take them. She has prostat but has not been taking these either. - we reviewed formulary options and decided to re-try prostat and try magic cup (pt is most interested in orange creme).  Pt has lost 10% of her body weight since 12.2020. She was usually  230 lb and is now down to 206 per pt which is confirmed in the chart.  Noted coughing while drinking ice water - discussed this with RN.   Medications reviewed and include: reglan, senokot-s Labs reviewed: K+ 3 (L)  NUTRITION - FOCUSED PHYSICAL EXAM:    Most Recent Value  Orbital Region  No depletion  Upper Arm Region  No depletion  Thoracic and Lumbar Region  No depletion  Buccal Region  No depletion  Temple Region  No depletion  Clavicle Bone Region  No depletion  Clavicle and Acromion Bone Region  Moderate depletion  Scapular Bone Region  Mild depletion  Dorsal Hand  No depletion  Patellar Region  No depletion  Anterior Thigh Region  No depletion  Posterior Calf Region  No depletion  Edema (RD Assessment)  Severe [BLE]  Hair  Reviewed  Eyes  Reviewed  Mouth  Reviewed  Skin  Reviewed  Nails  Reviewed    Pt with severe BLE edema.    Diet Order:   Diet Order            Diet regular Room service appropriate? Yes; Fluid consistency: Thin  Diet effective now              EDUCATION NEEDS:   Education needs have been addressed  Skin:  Skin Assessment: Reviewed RN Assessment  Last BM:     Height:   Ht Readings from Last 1 Encounters:  01/04/2020 5\' 7"  (1.702 m)    Weight:  Wt Readings from Last 1 Encounters:  01/06/2020 93.8 kg    Ideal Body Weight:  61.3 kg  BMI:  Body mass index is 32.39 kg/m.  Estimated Nutritional Needs:   Kcal:  1900-2100  Protein:  95-115 grams  Fluid:  >1.9 L/day  Lockie Pares., RD, LDN, CNSC See AMiON for contact information

## 2020-01-14 NOTE — Evaluation (Signed)
Occupational Therapy Evaluation Patient Details Name: Melissa Hurley MRN: [id number] DOB: [birth year] Today's Date: 01/14/2020    History of Present Illness This 54 y.o. female admitted 5/26 with lethargy and hypotension.  CT showed possible hemorrhagic conversion of recent MCA infarct, however, MRI showed no conversion.  dx: anemia, thrombocytopenia in setting of malignancy, hypokalemia, hypocalcemia.  PMH Includes recent Lt MCA infarct, recently diagnosed metastatic stage IV cholangiocarcinoma, recent bacteremia in the setting of port-A-Cath, pericardial effusion    Clinical Impression   Pt admitted with above. She demonstrates the below listed deficits and will benefit from continued OT to maximize safety and independence with BADLs.  Pt presents to OT with generalized weakness, decreased activity tolerance, impaired balance, generalized pain.  She fatigues quite rapidly with minimal activity. She currently requires set up assist - total A for ADLs and min A for functional transfers.  HR to 122 with RR 43 with activity.  She lives with spouse and 16 y.o. daughter, who have been providing care to her.  Spouse has recently started working from home.  Recommend HHOT, as well as a HHaide.       Follow Up Recommendations  Home health OT, [facility] ;Supervision/Assistance - 24 hour    Equipment Recommendations  rollator, and transport w/c    Recommendations for Other Services       Precautions / Restrictions Precautions Precautions: Fall Precaution Comments: Pt reports h/o two falls in which she slid off the EOB.  Spouse had to assist her from the floor       Mobility Bed Mobility Overal bed mobility: Needs Assistance Bed Mobility: Rolling;Sidelying to Sit Rolling: Supervision Sidelying to sit: Mod assist       General bed mobility comments: assist to move LEs fully off the bed and to lift trunk.  Pt able to roll Lt and Rt and perform bridging, but fatigues rapidly with activity    Transfers Overall transfer level: Needs assistance Equipment used: 1 person hand held assist Transfers: Sit to/from [employer] Sit to Stand: Min assist Stand pivot transfers: Min assist       General transfer comment: assist to steady and for balance     Balance Overall balance assessment: Needs assistance Sitting-balance support: Feet supported Sitting balance-Leahy Scale: Fair     Standing balance support: Single extremity supported Standing balance-Leahy Scale: Poor Standing balance comment: requires UE support                            ADL either performed or assessed with clinical judgement   ADL Overall ADL's : Needs assistance/impaired Eating/Feeding: Set up;Sitting   Grooming: Wash/dry hands;Wash/dry face;Oral care;Set up;Sitting   Upper Body Bathing: Moderate assistance;Sitting   Lower Body Bathing: Moderate assistance;Sit to/from stand   Upper Body Dressing : Moderate assistance;Sitting   Lower Body Dressing: Total assistance;Sit to/from stand   Toilet Transfer: Minimal Production assistant, radio Details (indicate cue type and reason): assist to steady  Toileting- Clothing Manipulation and Hygiene: Moderate assistance;Sit to/from stand       Functional mobility during ADLs: Minimal assistance General ADL Comments: pt fatigues quickly with activity      Vision Baseline Vision/History: Wears glasses Wears Glasses: At all times Patient Visual Report: No change from baseline       Perception     Praxis      Pertinent Vitals/Pain Pain Assessment: Faces Faces Pain Scale: Hurts a little bit Pain Location: generalized - pt  denies pain, but reports constant "discomfort"; however, pt is very restless due to discomfort  Pain Descriptors / Indicators: Discomfort;Restless Pain Intervention(s): Monitored during session;Repositioned     Hand Dominance Right   Extremity/Trunk Assessment Upper Extremity  Assessment Upper Extremity Assessment: Generalized weakness(bil UEs tremulous )   Lower Extremity Assessment Lower Extremity Assessment: Defer to PT evaluation   Cervical / Trunk Assessment Cervical / Trunk Assessment: Normal   Communication Communication Communication: Expressive difficulties   Cognition Arousal/Alertness: Awake/alert Behavior During Therapy: WFL for tasks assessed/performed;Anxious Overall Cognitive Status: Impaired/Different from baseline Area of Impairment: Attention;Problem solving                   Current Attention Level: Selective         Problem Solving: Difficulty sequencing;Requires verbal cues;Slow processing     General Comments  HR to 122, and RR 43 with DOE 3/4 with activity     Exercises     Shoulder Instructions      Home Living Family/patient expects to be discharged to:: Private residence Living Arrangements: Spouse/significant other;Children Available Help at Discharge: Family;Available PRN/intermittently Type of Home: House Home Access: Stairs to enter [employer] of Steps: 3 Entrance Stairs-Rails: Can reach both Home Layout: Two level;Bed/bath upstairs;Able to live on main level with bedroom/bathroom Alternate Level Stairs-Number of Steps: Pt reports she has been sleeping downstairs  Alternate Level Stairs-Rails: Right Bathroom Shower/Tub: Occupational psychologist: Standard     Home Equipment: Bedside commode   Additional Comments: lives with spouse, who recently started working from home, and has 64 y.o. daughter       Prior Functioning/Environment Level of Independence: Needs assistance  Gait / Transfers Assistance Needed: pt reports she has not been ambulating with AD, but spouse often has to assist her  ADL's / Homemaking Assistance Needed: Spouse assists her with most ADLs.  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         OT Problem List: Decreased strength;Decreased activity tolerance;Impaired balance (sitting and/or standing);Decreased cognition;Decreased knowledge of use of DME or AE;Cardiopulmonary status limiting activity;Pain      OT Treatment/Interventions: Self-care/ADL training;Therapeutic exercise;DME and/or AE instruction;Therapeutic activities;Cognitive remediation/compensation;Patient/family education;Balance training    OT Goals(Current goals can be found in the care plan section) Acute Rehab OT Goals Patient Stated Goal: to get stronger  OT Goal Formulation: With patient Time For Goal Achievement: 01/28/20 Potential to Achieve Goals: Fair ADL Goals Pt Will Perform Grooming: with min assist;standing Pt Will Perform Upper Body Bathing: with set-up;with supervision;sitting Pt Will Perform Lower Body Bathing: with min assist;sit to/from stand Pt Will Perform Upper Body Dressing: with set-up;with supervision;sitting Pt Will Perform Lower Body Dressing: sit to/from stand;with mod assist Pt Will Transfer to Toilet: with min assist;ambulating;regular height toilet;bedside commode;grab bars Pt Will Perform Toileting - Clothing Manipulation and hygiene: with min assist;sit to/from stand Pt/caregiver will Perform Home Exercise Program: Increased strength;Right Upper extremity;Left upper extremity;With theraband;With written HEP provided;With Supervision Additional ADL Goal #1: Pt will successfully incorporate energy conservation strategies into daily activities  OT Frequency: Min 2X/week   Barriers to D/C:            Co-evaluation              AM-PAC OT "6 Clicks" Daily Activity     Outcome Measure Help from another person eating meals?: A Little Help from  another person taking care of personal grooming?: A Little Help from another person toileting, which includes using toliet, bedpan, or urinal?: A Lot Help from another person bathing  (including washing, rinsing, drying)?: A Lot Help from another person to put on and taking off regular upper body clothing?: A Lot Help from another person to put on and taking off regular lower body clothing?: Total 6 Click Score: 13   End of Session Equipment Utilized During Treatment: Gait belt Nurse Communication: Mobility status  Activity Tolerance: Patient limited by fatigue Patient left: in chair;with call bell/phone within reach;with chair alarm set  OT Visit Diagnosis: Unsteadiness on feet (R26.81);Muscle weakness (generalized) (M62.81)                Time: [id number] OT Time Calculation (min): 46 min Charges:  OT General Charges $OT Visit: 1 Visit OT Evaluation $OT Eval Moderate Complexity: 1 Mod OT Treatments $Therapeutic Activity: 23-37 mins  Nilsa Nutting., OTR/L Acute Rehabilitation Services Pager [phone] Office [place], [place] 01/14/2020, 2:24 PM

## 2020-01-15 ENCOUNTER — Encounter (HOSPITAL_COMMUNITY): Payer: Self-pay | Admitting: Pulmonary Disease

## 2020-01-15 ENCOUNTER — Inpatient Hospital Stay (HOSPITAL_COMMUNITY): Payer: BC Managed Care – PPO

## 2020-01-15 DIAGNOSIS — Z515 Encounter for palliative care: Secondary | ICD-10-CM

## 2020-01-15 DIAGNOSIS — E43 Unspecified severe protein-calorie malnutrition: Secondary | ICD-10-CM | POA: Diagnosis present

## 2020-01-15 LAB — BASIC METABOLIC PANEL
Anion gap: 9 (ref 5–15)
BUN: 11 mg/dL (ref 6–20)
CO2: 17 mmol/L — ABNORMAL LOW (ref 22–32)
Calcium: 6.5 mg/dL — ABNORMAL LOW (ref 8.9–10.3)
Chloride: 116 mmol/L — ABNORMAL HIGH (ref 98–111)
Creatinine, Ser: 1.2 mg/dL — ABNORMAL HIGH (ref 0.44–1.00)
GFR calc Af Amer: >60 mL/min (ref >60)
GFR calc non Af Amer: 52 mL/min — ABNORMAL LOW (ref >60)
Glucose, Bld: 99 mg/dL (ref 70–99)
Potassium: 3.5 mmol/L (ref 3.5–5.1)
Sodium: 142 mmol/L (ref 135–145)

## 2020-01-15 LAB — PHOSPHORUS: Phosphorus: 1.9 mg/dL — ABNORMAL LOW (ref 2.5–4.6)

## 2020-01-15 LAB — MAGNESIUM: Magnesium: 1.6 mg/dL — ABNORMAL LOW (ref 1.7–2.4)

## 2020-01-15 MED ORDER — LORAZEPAM 2 MG/ML IJ SOLN
1.0000 mg | Freq: Once | INTRAMUSCULAR | Status: AC
  Administered 2020-01-15: 1 mg via INTRAVENOUS
  Filled 2020-01-15: qty 1

## 2020-01-15 MED ORDER — IOHEXOL 9 MG/ML PO SOLN
500.0000 mL | ORAL | Status: AC
  Administered 2020-01-15: 500 mL via ORAL

## 2020-01-15 MED ORDER — IOHEXOL 300 MG/ML  SOLN
100.0000 mL | Freq: Once | INTRAMUSCULAR | Status: AC | PRN
  Administered 2020-01-15: 100 mL via INTRAVENOUS

## 2020-01-15 NOTE — Progress Notes (Signed)
   01/15/20 1951  Provider Notification  Provider Name/Title NP X. Blount  Date Provider Notified 01/15/20  Time Provider Notified 1952  Notification Type Page  Notification Reason Other (Comment) (Pt c/o of severe anxiety, meds not due till 2230)  Response See new orders  Date of Provider Response 01/15/20  Time of Provider Response [phone]

## 2020-01-15 NOTE — Progress Notes (Addendum)
Patient nauseated; attempting to drink contrast for CT scan; medicated for nausea earlier this am; continue to monitor.

## 2020-01-15 NOTE — Progress Notes (Signed)
PROGRESS NOTE    Melissa Hurley  [id number] DOB: 12/05/1965 DOA: 01/17/2020 PCP: Patient, No Pcp Per    Brief Narrative:  54 year old female with history of recently diagnosed metastatic cholangiocarcinoma on chemotherapy, recent bacteremia with Corynebacterium in the setting of Port-A-Cath infection that was removed and treated with vancomycin.  Left MCA CVA in March 2021 and currently on Eliquis.  She was following up at ID office on 5/26 and was found to be hypotensive and lethargic and sent to the ER.  Initial CT scan of the head suspected of hemorrhagic conversion of right MCA infarct so patient was admitted to neuro ICU. 5/26, admitted to neuro ICU from [facility] emergency room. CT head, 5/26 possible interval hemorrhage right MCA artery distribution infarct without mass-effect MRI of the brain 5/26, expected interval evolution of subacute cortical and subcortical infarcts involving bilateral cerebral hemispheres. Cultures negative. 5/29, continues to be extremely weak, shaky.  Hematuria.  Assessment & Plan:   Active Problems:   Intracerebral hemorrhage (HCC)   Protein-calorie malnutrition, severe  Hypotension: From hypovolemia.  No evidence of sepsis.  Treated with maintenance IV fluids with improvement.  Cultures are negative.  PICC line to remain as she is potential chemotherapy candidate as requested by oncology.  Recent history of left MCA stroke with suspected hemorrhagic conversion: MRI negative for hemorrhagic conversion.  Eliquis resumed.  Seen by neurology and signed off.  Continue to work with PT OT. Now patient has frank hematuria, will hold off Eliquis. Patient remains severely debilitated.  Metastatic cholangiocarcinoma/failure to thrive/severe physical debility: Status post 3 cycles of chemotherapy.  Followed by oncology.  Staging CT scans today.  Given patient's advanced metastatic cholangiocarcinoma and failure to thrive with severe debility, patient is  probably at her end-of-life.  Followed by her oncology. We will consult palliative care team to coordinate care.  Anemia, thrombocytopenia in the setting of malignancy: Fairly stable.  We will recheck blood counts in the morning labs.  Hypokalemia: Replaced aggressively with improvement.  Hypocalcemia: Corrected calcium more than 8.  No indication for replacement.  Goal of care: Patient with advanced cancer and debility, probably at her end of life.  I encouraged her to discuss with me what she thinks about her disease process.  She says she is aware about poor prognosis, however she wants to take 1 day at a time and does not want to think much about it. Staging CT scans today.  Hopefully her oncologist will give her prognosis and we will have easy conversation about end-of-life care.  DVT prophylaxis: SCD,  Code Status: Full code Family Communication: None at bedside, will meet Disposition Plan: Status is: Inpatient  Remains inpatient appropriate because:Getting CT scans, staging of cancer and prognostication discussion.   Dispo: The patient is from: Home              Anticipated d/c is to: Home              Anticipated d/c date is: 2 days              Patient currently is not medically stable to d/c.  Patient is stabilizing.  Oncology is following.  They will discuss prognosis with patient after CT scans.  Depends upon patient's further oncology plan, probable home with home health.  Consultants:   Oncology  Neurology  PCCM  Procedures:   None  Antimicrobials:  Antibiotics Given (last 72 hours)    Date/Time Action Medication Dose Rate   12/28/2019 1434 New  Bag/Given   ceFEPIme (MAXIPIME) 2 g in sodium chloride 0.9 % 100 mL IVPB 2 g 200 mL/hr   01/08/2020 1434 New Bag/Given   metroNIDAZOLE (FLAGYL) IVPB 500 mg 500 mg 100 mL/hr   12/24/2019 1526 New Bag/Given   vancomycin (VANCOREADY) IVPB 2000 mg/400 mL 2,000 mg 200 mL/hr         Subjective: Patient seen and examined.   No overnight events.  Poor appetite.  Bleeding from the urine. Feels extremely weak.  She is shaky when she is trying to eat.  She thinks she is slurring her speech today.  Objective: Vitals:   01/14/20 1950 01/14/20 2346 01/15/20 0453 01/15/20 0910  BP: 136/74 (!) 122/57 (!) 145/79 119/70  Pulse: (!) 110 92 98 94  Resp: 18 20 20 20   Temp: 98.2 F (36.8 C) 98.4 F (36.9 C) 98.9 F (37.2 C) 98.1 F (36.7 C)  TempSrc: Oral Oral Oral Oral  SpO2: 100% 100% 100% 100%  Weight:      Height:        Intake/Output Summary (Last 24 hours) at 01/15/2020 1136 Last data filed at 01/14/2020 1500 Gross per 24 hour  Intake 704.84 ml  Output 150 ml  Net 554.84 ml   Filed Weights   01/09/2020 2020  Weight: 93.8 kg    Examination:  General exam: Sick looking, extremely debilitated, resting tremors. Respiratory system: Clear to auscultation. Respiratory effort normal. Cardiovascular system: S1 & S2 heard, RRR. No JVD, murmurs, rubs, gallops or clicks.  2+ pedal edema with tenderness. Gastrointestinal system: Abdomen is nondistended, soft and nontender.  Central nervous system: Alert and oriented. No focal neurological deficits. Extremities: Symmetric 5 x 5 power. Skin: No rashes, lesions or ulcers. Psychiatry: Judgement and insight appear normal. Mood & affect flat and anxious.    Data Reviewed: I have personally reviewed following labs and imaging studies  CBC: Recent Labs  Lab 01/16/2020 1348 01/14/2020 2222 01/13/20 0503 01/14/20 0507  WBC 11.2*  --  10.2 10.9*  NEUTROABS 7.9*  --   --   --   HGB 6.9* 8.2* 8.1* 7.6*  HCT 23.6* 27.3* 26.8* 25.1*  MCV 87.7  --  86.2 86.9  PLT 138*  --  105* 95*   Basic Metabolic Panel: Recent Labs  Lab 12/21/2019 1348 01/15/2020 1348 01/13/20 0503 01/13/20 1853 01/14/20 0507 01/14/20 2100 01/15/20 0500 01/15/20 0837  NA 141   < > 142 141 139 140  --  142  K 3.3*   < > 2.9* 4.0 3.0* 3.8  --  3.5  CL 110   < > 109 113* 115* 112*  --  116*    CO2 22   < > 19* 20* 18* 18*  --  17*  GLUCOSE 101*   < > 77 85 108* 104*  --  99  BUN 16   < > 13 11 13 14   --  11  CREATININE 1.28*   < > 1.15* 1.29* 1.18* 1.30*  --  1.20*  CALCIUM 6.3*   < > 6.2* 6.4* 6.4* 6.6*  --  6.5*  MG 1.1*  --   --   --  2.1  --  1.6*  --   PHOS  --   --   --  2.5 2.5  --  1.9*  --    < > = values in this interval not displayed.   GFR: Estimated Creatinine Clearance: 64.5 mL/min (A) (by C-G formula based on SCr of 1.2 mg/dL (H)).  Liver Function Tests: Recent Labs  Lab 01/09/2020 1348 01/13/20 0503  AST 33 25  ALT 16 13  ALKPHOS 246* 215*  BILITOT 1.3* 1.1  PROT 5.7* 5.1*  ALBUMIN 1.9* 1.4*   No results for input(s): LIPASE, AMYLASE in the last 168 hours. Recent Labs  Lab 01/02/2020 1348  AMMONIA 25   Coagulation Profile: Recent Labs  Lab 01/06/2020 1348  INR 3.0*   Cardiac Enzymes: No results for input(s): CKTOTAL, CKMB, CKMBINDEX, TROPONINI in the last 168 hours. BNP (last 3 results) No results for input(s): PROBNP in the last 8760 hours. HbA1C: No results for input(s): HGBA1C in the last 72 hours. CBG: No results for input(s): GLUCAP in the last 168 hours. Lipid Profile: No results for input(s): CHOL, HDL, LDLCALC, TRIG, CHOLHDL, LDLDIRECT in the last 72 hours. Thyroid Function Tests: No results for input(s): TSH, T4TOTAL, FREET4, T3FREE, THYROIDAB in the last 72 hours. Anemia Panel: No results for input(s): VITAMINB12, FOLATE, FERRITIN, TIBC, IRON, RETICCTPCT in the last 72 hours. Sepsis Labs: Recent Labs  Lab 12/22/2019 1348 01/03/2020 1735  LATICACIDVEN 3.0* 3.5*    Recent Results (from the past 240 hour(s))  Urine culture     Status: None   Collection Time: 01/16/2020 12:56 PM   Specimen: In/Out Cath Urine  Result Value Ref Range Status   Specimen Description   Final    IN/OUT CATH URINE Performed at [facility], [place] [address], [place], [place] [address]    Special Requests   Final    NONE Performed at  [facility], [place] [address] [place], [place] [address]    Culture   Final    NO GROWTH Performed at [facility], [place] [address] [place], [place] [address]    Report Status 01/14/2020 FINAL  Final  SARS Coronavirus 2 by RT PCR (hospital order, performed in [facility] hospital lab) Nasopharyngeal Nasopharyngeal Swab     Status: None   Collection Time: 12/24/2019  1:48 PM   Specimen: Nasopharyngeal Swab  Result Value Ref Range Status   SARS Coronavirus 2 NEGATIVE NEGATIVE Final    Comment: (NOTE) SARS-CoV-2 target nucleic acids are NOT DETECTED. The SARS-CoV-2 RNA is generally detectable in upper and lower respiratory specimens during the acute phase of infection. The lowest concentration of SARS-CoV-2 viral copies this assay can detect is 250 copies / mL. A negative result does not preclude SARS-CoV-2 infection and should not be used as the sole basis for treatment or other patient management decisions.  A negative result may occur with improper specimen collection / handling, submission of specimen other than nasopharyngeal swab, presence of viral mutation(s) within the areas targeted by this assay, and inadequate number of viral copies (<250 copies / mL). A negative result must be combined with clinical observations, patient history, and epidemiological information. Fact Sheet for Patients:   StrictlyIdeas.no Fact Sheet for Healthcare Providers: BankingDealers.co.za This test is not yet approved or cleared  by the [place] FDA and has been authorized for detection and/or diagnosis of SARS-CoV-2 by FDA under an Emergency Use Authorization (EUA).  This EUA will remain in effect (meaning this test can be used) for the duration of the COVID-19 declaration under Section 564(b)(1) of the Act, 21 U.S.C. section 360bbb-3(b)(1), unless the authorization is terminated or revoked sooner. Performed at [facility], [place] [address] [place], [place] [address]   Blood Culture (routine x 2)     Status: None (Preliminary result)  Collection Time: 01/06/2020  2:00 PM   Specimen: BLOOD  Result Value Ref Range Status   Specimen Description BLOOD SITE NOT SPECIFIED  Final   Special Requests   Final    BOTTLES DRAWN AEROBIC AND ANAEROBIC Blood Culture adequate volume Performed at [place] [address] [place], [place] [address]    Culture   Final    NO GROWTH 2 DAYS Performed at [place] [address] [place], [place] [address]    Report Status PENDING  Incomplete  MRSA PCR Screening     Status: None   Collection Time: 01/01/2020  8:40 PM   Specimen: Nasal Mucosa; Nasopharyngeal  Result Value Ref Range Status   MRSA by PCR NEGATIVE NEGATIVE Final    Comment:        The GeneXpert MRSA Assay (FDA approved for NASAL specimens only), is one component of a comprehensive MRSA colonization surveillance program. It is not intended to diagnose MRSA infection nor to guide or monitor treatment for MRSA infections. Performed at [facility], [place] [address] [place],  [address]          Radiology Studies: DG UGI W SINGLE CM (SOL OR THIN BA)  Result Date: 01/13/2020 CLINICAL DATA:  Nausea and vomiting.  Metastatic cholangiocarcinoma. EXAM: UPPER GI SERIES WITHOUT KUB TECHNIQUE: Routine upper GI series was performed with high-density barium. FLUOROSCOPY TIME:  Fluoroscopy Time:  2.7 minutes COMPARISON:  CT scan of the chest dated 09/22/2019 and CT scan of the abdomen and pelvis dated 07/30/2019 FINDINGS: Because of the patient's severe nausea she had difficulty ingesting the barium. She did aspirate a small amount of the thick barium upon initial swallow. She was able to cough and remove the small amount of aspirated barium. There is no evidence of esophageal mass or stricture. There is a 5 cm hiatal hernia with edema in the mucosa of  hiatal hernia. No appreciable stricture. Patient was only able to ingest 2 swallows of barium. However, the barium did pass into the stomach. The contrast passed into the nondistended duodenal bulb and nondistended C-loop and beyond the ligament of Treitz into the nondistended jejunum. There is no discrete mass in the stomach and there is no discrete ulcer. There is now a mass in the superior aspect of the left hilum silhouetting the left side of the arch of the aorta. There appears to be hyperinflation of right middle lobe suggesting that there may be a ball valve type obstruction of the right middle lobe bronchus. These findings are not apparent on the chest x-ray performed on 01/14/2020. IMPRESSION: 1. No evidence of obstruction of the esophagus or stomach or duodenum. 2. 5 cm hiatal hernia. 3. Slight edema in the mucosa of the hiatal hernia. 4. Patient aspirated a small amount of thick barium but was able to cough and expel the aspirated barium without difficulty. 5. Possible mass in the superior aspect of the left hilum. Possible hyperinflation of the right middle lobe. Electronically Signed   By: Lorriane Shire M.D.   On: 01/13/2020 16:26        Scheduled Meds: . Chlorhexidine Gluconate Cloth  6 each Topical Daily  . feeding supplement (PRO-STAT SUGAR FREE 64)  30 mL Oral BID  . iohexol  500 mL Oral Q1H  . metoCLOPramide (REGLAN) injection  5 mg Intravenous Q8H  . metoprolol tartrate  12.5 mg Oral BID  . mirtazapine  7.5 mg Oral QHS  . pantoprazole  40  mg Oral Daily  . senna-docusate  1 tablet Oral BID   Continuous Infusions: . sodium chloride 75 mL/hr at 01/14/20 1500     LOS: 3 days    Time spent: 30 minutes     Barb Merino, MD [facility] Pager [phone]

## 2020-01-15 NOTE — Consult Note (Signed)
Consultation Note Date: 01/15/2020   Patient Name: Melissa Hurley  DOB: 10-19-1965  MRN: [id number]  Age / Sex: 54 y.o., female  PCP: Patient, No Pcp Per Referring Physician: Barb Merino, MD  Reason for Consultation: Establishing goals of care and Psychosocial/spiritual support  HPI/Patient Profile: 54 y.o. female  with past medical history of metastatic intrahepatic cholangiocarcinoma stage 4, recent L MCA CVA, admit for bacteremia end of April at [facility], admitted on 01/01/2020 with hypotension, recent L MCA stroke, cholangiocarcinoma with mets/FTT, debility.   Clinical Assessment and Goals of Care: I have reviewed medical records including EPIC notes, labs and imaging, received report from bedside nursing staff, examined the patient and met at bedside with patient to discuss diagnosis prognosis, GOC, EOL wishes, disposition and options.  Mrs. Bert is resting quietly in bed.  She has just returned from CT to stage the progression of cancer.  She is alert and oriented, but appears weak and frail.  She is quiet subdued, but able to make her basic needs known.  There is no family at bedside at this time.   I introduced Palliative Medicine as specialized medical care for people living with serious illness. It focuses on providing relief from the symptoms and stress of a serious illness.  At this time Mrs. Scoot tells me that she feels that she had adequate symptom management.   We talk about her CT, awaiting results.  We also talk about following up with her oncologist.    Conference with attending and bedside nursing staff related to patient condition, needs, and GO>   Mrs. Twitty would benefit from continued [place] discussions with out patient palliative services after she has had time to meet with oncology.    HCPOA   NEXT OF KIN - Spouse Levie Heritage.  Mrs. Donze has one child, a minor, 2 yo.     SUMMARY  OF RECOMMENDATIONS   Awaiting CT result for progression of cancer burden.  Follow up with Oncology for next steps At this point would accept all measures  Code Status/Advance Care Planning:  Full code  Symptom Management:   Per hospitalist, no additional needs at this time.   Palliative Prophylaxis:   Frequent Pain Assessment and Oral Care  Additional Recommendations (Limitations, Scope, Preferences):  Full Scope Treatment  Psycho-social/Spiritual:   Desire for further Chaplaincy support:no  Additional Recommendations: Caregiving  Support/Resources and Education on Hospice  Prognosis:   Unable to determine, based on outcomes.  Cancer treatments offered/accepted.   Discharge Planning: Home with out patient palliative to follow. .      Primary Diagnoses: Present on Admission: . Intracerebral hemorrhage ([place])   I have reviewed the medical record, interviewed the patient and family, and examined the patient. The following aspects are pertinent.  Past Medical History:  Diagnosis Date  . Bloodstream infection due to Port-A-Cath 01/17/2020  . Cancer ([place])    Liver  . Cholangiocarcinoma ([place])   . Metastasis ([place])   . Pericardial effusion   . Sinus tachycardia   .  Uterine fibroid    Social History   Socioeconomic History  . Marital status: Married    Spouse name: Not on file  . Number of children: Not on file  . Years of education: Not on file  . Highest education level: Not on file  Occupational History  . Occupation: Pharmacist, hospital  Tobacco Use  . Smoking status: Never Smoker  . Smokeless tobacco: Never Used  Substance and Sexual Activity  . Alcohol use: Never  . Drug use: Never  . Sexual activity: Not Currently  Other Topics Concern  . Not on file  Social History Narrative  . Not on file   Social Determinants of Health   Financial Resource Strain:   . Difficulty of Paying Living Expenses:   Food Insecurity:   . Worried About Charity fundraiser in the  Last Year:   . Arboriculturist in the Last Year:   Transportation Needs:   . Film/video editor (Medical):   Marland Kitchen Lack of Transportation (Non-Medical):   Physical Activity:   . Days of Exercise per Week:   . Minutes of Exercise per Session:   Stress:   . Feeling of Stress :   Social Connections:   . Frequency of Communication with Friends and Family:   . Frequency of Social Gatherings with Friends and Family:   . Attends Religious Services:   . Active Member of Clubs or Organizations:   . Attends Archivist Meetings:   Mar[place] Kitchen Marital Status:    Family History  Problem Relation Age of Onset  . Breast cancer Mother        27's  . Ovarian cancer Mother   . Lung cancer Mother        66s  . Ovarian cancer Maternal Aunt   . Breast cancer Maternal Grandmother   . Breast cancer Cousin        maternal, dx50+  . Colon cancer Neg Hx   . Esophageal cancer Neg Hx   . Rectal cancer Neg Hx   . Stomach cancer Neg Hx    Scheduled Meds: . Chlorhexidine Gluconate Cloth  6 each Topical Daily  . feeding supplement (PRO-STAT SUGAR FREE 64)  30 mL Oral BID  . metoCLOPramide (REGLAN) injection  5 mg Intravenous Q8H  . metoprolol tartrate  12.5 mg Oral BID  . mirtazapine  7.5 mg Oral QHS  . pantoprazole  40 mg Oral Daily  . senna-docusate  1 tablet Oral BID   Continuous Infusions: . sodium chloride 75 mL/hr at 01/14/20 1500   PRN Meds:.acetaminophen **OR** acetaminophen (TYLENOL) oral liquid 160 mg/5 mL **OR** acetaminophen, LORazepam, ondansetron (ZOFRAN) IV Medications Prior to Admission:  Prior to Admission medications   Medication Sig Start Date End Date Taking? Authorizing Provider  apixaban (ELIQUIS) 5 MG TABS tablet Take 1 tablet (5 mg total) by mouth 2 (two) times daily. 01/06/20  Yes Orson Slick, MD  dexamethasone (DECADRON) 4 MG tablet Take 8 mg by mouth See admin instructions. Take 2 tablets (8 mg)  by mouth with breakfast on days 2 and 3 of each treatment cycle  10/04/19  Yes [provider]  glycopyrrolate (ROBINUL) 1 MG tablet Take 1 mg by mouth 3 (three) times daily as needed (drooling).  12/06/19  Yes [provider]  loperamide (IMODIUM) 2 MG capsule Take 1 capsule (2 mg total) by mouth every 6 (six) hours as needed for diarrhea or loose stools. 12/18/19  Yes Florencia Reasons, MD  LORazepam (ATIVAN) 1 MG tablet Take 1 mg by mouth every 8 (eight) hours as needed for anxiety.  10/12/19  Yes [provider]  metoprolol tartrate (LOPRESSOR) 25 MG tablet Take 1 tablet (25 mg total) by mouth 2 (two) times daily. Hold if sbp less than 100 12/18/19  Yes Florencia Reasons, MD  mirtazapine (REMERON) 7.5 MG tablet Take 1 tablet (7.5 mg total) by mouth at bedtime. 01/04/20  Yes Orson Slick, MD  nystatin (MYCOSTATIN) 100000 UNIT/ML suspension Take 5 mLs by mouth every 4 (four) hours as needed (thrush).  10/14/19  Yes [provider]  ondansetron (ZOFRAN) 8 MG tablet Take 8 mg by mouth every 8 (eight) hours as needed for nausea.  09/20/19  Yes [provider]  prochlorperazine (COMPAZINE) 10 MG tablet Take 10 mg by mouth every 8 (eight) hours as needed for nausea or vomiting.   Yes [provider]   No Known Allergies Review of Systems  Unable to perform ROS: Other    Physical Exam Vitals and nursing note reviewed.  Constitutional:      General: She is not in acute distress.    Appearance: She is ill-appearing.  HENT:     Head: Atraumatic.     Mouth/Throat:     Mouth: Mucous membranes are moist.  Cardiovascular:     Rate and Rhythm: Normal rate.  Pulmonary:     Effort: Pulmonary effort is normal. No respiratory distress.  Abdominal:     Palpations: Abdomen is soft.  Musculoskeletal:        General: Swelling present.  Skin:    General: Skin is warm and dry.  Neurological:     Mental Status: She is alert and oriented to person, place, and time.  Psychiatric:     Comments: Calm and cooperative      Vital Signs: BP  115/73 (BP Location: Right Arm)   Pulse 91   Temp 98.7 F (37.1 C) (Oral)   Resp 17   Ht _0  (1.702 m)   Wt 93.8 kg   LMP 10/25/2014   SpO2 100%   BMI 32.39 kg/m  Pain Scale: 0-10   Pain Score: 0-No pain   SpO2: SpO2: 100 % O2 Device:SpO2: 100 % O2 Flow Rate: .   IO: Intake/output summary:   Intake/Output Summary (Last 24 hours) at 01/15/2020 1331 Last data filed at 01/14/2020 1500 Gross per 24 hour  Intake 704.84 ml  Output 150 ml  Net 554.84 ml    LBM: Last BM Date: 01/15/20 Baseline Weight: Weight: 93.8 kg Most recent weight: Weight: 93.8 kg     Palliative Assessment/Data:   Flowsheet Rows     Most Recent Value  Intake Tab  Referral Department  Hospitalist  Unit at Time of Referral  Med/Surg Unit  Palliative Care Primary Diagnosis  Cancer  Date Notified  01/15/20  Palliative Care Type  New Palliative care  Reason for referral  Clarify Goals of Care, Psychosocial or Spiritual support  Date of Admission  01/10/2020  Date first seen by Palliative Care  01/15/20  # of days Palliative referral response time  0 Day(s)  # of days IP prior to Palliative referral  3  Clinical Assessment  Palliative Performance Scale Score  50%  Pain Max last 24 hours  Not able to report  Pain Min Last 24 hours  Not able to report  Dyspnea Max Last 24 Hours  Not able to report  Dyspnea Min Last 24 hours  Not able to report  Psychosocial & Spiritual Assessment  Palliative Care Outcomes      Time In: 1530 Time Out: 1600 Time Total: 30 minutes  Greater than 50%  of this time was spent counseling and coordinating care related to the above assessment and plan.  Signed by: Drue Novel, NP   Please contact Palliative Medicine Team phone at [phone] for questions and concerns.  For individual provider: See Shea Evans

## 2020-01-15 NOTE — Progress Notes (Signed)
Occupational Therapy Treatment Patient Details Name: Melissa Hurley MRN: [id number] DOB: 11/27/1965 Today's Date: 01/15/2020    History of present illness 54 year old female with history of metastatic cholangiocarcinoma on chemotherapy, recent bacteremia in the setting of Port-A-Cath infection.  Left MCA CVA in March 2021 and currently on Eliquis.  She was following up at ID office on 5/26 and was found to be hypotensive and lethargic. Initial CT showing suspected hemorrhagic conversion of L MCA infarct. MRI showing expected interval evolution of subacute cortical and subcortical infarcts involving bilateral cerebral hemispheres.   OT comments  Patient continues to make steady progress towards goals in skilled OT session. Patient's session encompassed ADLs EOB and bed level exercises in order to promote overall activity tolerance. Pt with increased edema in BLEs stating "my legs feel heavier" requiring increased assist to get to EOB. Pt willing to complete bathing activities, however declined dynamic balance activities due to increased fatigue. Pt returned back to bed as transport arrived to take pt to CT. Discharge remains appropriate at this time; will continue to follow acutely.    Follow Up Recommendations  Home health OT;Supervision/Assistance - 24 hour    Equipment Recommendations  Other (comment)(Rollator and transport w/c)    Recommendations for Other Services      Precautions / Restrictions Precautions Precautions: Fall Precaution Comments: pt reports sliding off edge of bed Restrictions Weight Bearing Restrictions: No       Mobility Bed Mobility Overal bed mobility: Needs Assistance Bed Mobility: Rolling;Sidelying to Sit;Sit to Supine Rolling: Min guard Sidelying to sit: Mod assist   Sit to supine: Mod assist   General bed mobility comments: assist to move LEs fully off the bed and to lift trunk.  Pt able to weight shift minimally EOB, but fatigued rapidly therefore  unable to challenge balance or completer trunk control activities  Transfers                      Balance Overall balance assessment: Needs assistance Sitting-balance support: Feet supported;Single extremity supported Sitting balance-Leahy Scale: Poor Sitting balance - Comments: minA-minG at edge of bed       Standing balance comment: deferred due to CT scan coming soon; RN request to leave in bed                           ADL either performed or assessed with clinical judgement   ADL Overall ADL's : Needs assistance/impaired     Grooming: Wash/dry hands;Wash/dry face;Set up;Sitting   Upper Body Bathing: Moderate assistance;Sitting Upper Body Bathing Details (indicate cue type and reason): Back Lower Body Bathing: Moderate assistance;Bed level Lower Body Bathing Details (indicate cue type and reason): Peri care area     Lower Body Dressing: Total assistance;Bed level Lower Body Dressing Details (indicate cue type and reason): Noted increased edema in BLEs             Functional mobility during ADLs: Minimal assistance;Moderate assistance General ADL Comments: pt fatigues quickly with activity and required increased assist in comparison to session yesterday with OT     Vision       Perception     Praxis      Cognition Arousal/Alertness: Awake/alert Behavior During Therapy: [facility] for tasks assessed/performed;Anxious Overall Cognitive Status: Within Functional Limits for tasks assessed  Exercises     Shoulder Instructions       General Comments      Pertinent Vitals/ Pain       Pain Assessment: Faces Faces Pain Scale: Hurts even more Pain Location: generalized discomfort Pain Descriptors / Indicators: Grimacing Pain Intervention(s): Limited activity within patient's tolerance;Monitored during session;Repositioned  Home Living                                           Prior Functioning/Environment              Frequency  Min 2X/week        Progress Toward Goals  OT Goals(current goals can now be found in the care plan section)  Progress towards OT goals: Progressing toward goals  Acute Rehab OT Goals Patient Stated Goal: to get stronger and improve activity tolerance OT Goal Formulation: With patient Time For Goal Achievement: 01/28/20 Potential to Achieve Goals: [place] Discharge plan remains appropriate    Co-evaluation                 AM-PAC OT "6 Clicks" Daily Activity     Outcome Measure   Help from another person eating meals?: A Little Help from another person taking care of personal grooming?: A Little Help from another person toileting, which includes using toliet, bedpan, or urinal?: A Lot Help from another person bathing (including washing, rinsing, drying)?: A Lot Help from another person to put on and taking off regular upper body clothing?: A Lot Help from another person to put on and taking off regular lower body clothing?: Total 6 Click Score: 13    End of Session    OT Visit Diagnosis: Unsteadiness on feet (R26.81);Muscle weakness (generalized) (M62.81)   Activity Tolerance Patient limited by fatigue;Patient limited by lethargy   Patient Left in bed;with call bell/phone within reach   Nurse Communication Mobility status(Transport to take pt to CT)        Time: 1417-1430 OT Time Calculation (min): 13 min  Charges: OT General Charges $OT Visit: 1 Visit OT Treatments $Self Care/Home Management : 8-22 mins  Corinne Ports E. Dorraine Ellender, COTA/L Acute Rehabilitation Services [place] 01/15/2020, 3:23 PM

## 2020-01-16 DIAGNOSIS — C799 Secondary malignant neoplasm of unspecified site: Secondary | ICD-10-CM

## 2020-01-16 DIAGNOSIS — G9341 Metabolic encephalopathy: Secondary | ICD-10-CM | POA: Diagnosis not present

## 2020-01-16 DIAGNOSIS — C221 Intrahepatic bile duct carcinoma: Principal | ICD-10-CM

## 2020-01-16 DIAGNOSIS — F411 Generalized anxiety disorder: Secondary | ICD-10-CM

## 2020-01-16 DIAGNOSIS — Z7189 Other specified counseling: Secondary | ICD-10-CM

## 2020-01-16 DIAGNOSIS — Z515 Encounter for palliative care: Secondary | ICD-10-CM

## 2020-01-16 LAB — CBC WITH DIFFERENTIAL/PLATELET
Abs Immature Granulocytes: 0.06 10*3/uL (ref 0.00–0.07)
Basophils Absolute: 0 10*3/uL (ref 0.0–0.1)
Basophils Relative: 0 %
Eosinophils Absolute: 0.1 10*3/uL (ref 0.0–0.5)
Eosinophils Relative: 0 %
HCT: 24.1 % — ABNORMAL LOW (ref 36.0–46.0)
Hemoglobin: 7.3 g/dL — ABNORMAL LOW (ref 12.0–15.0)
Immature Granulocytes: 1 %
Lymphocytes Relative: 18 %
Lymphs Abs: 2.1 10*3/uL (ref 0.7–4.0)
MCH: 26.2 pg (ref 26.0–34.0)
MCHC: 30.3 g/dL (ref 30.0–36.0)
MCV: 86.4 fL (ref 80.0–100.0)
Monocytes Absolute: 0.7 10*3/uL (ref 0.1–1.0)
Monocytes Relative: 6 %
Neutro Abs: 8.5 10*3/uL — ABNORMAL HIGH (ref 1.7–7.7)
Neutrophils Relative %: 75 %
Platelets: 81 10*3/uL — ABNORMAL LOW (ref 150–400)
RBC: 2.79 MIL/uL — ABNORMAL LOW (ref 3.87–5.11)
RDW: 18 % — ABNORMAL HIGH (ref 11.5–15.5)
WBC: 11.4 10*3/uL — ABNORMAL HIGH (ref 4.0–10.5)
nRBC: 0 % (ref 0.0–0.2)

## 2020-01-16 LAB — COMPREHENSIVE METABOLIC PANEL
ALT: 14 U/L (ref 0–44)
AST: 24 U/L (ref 15–41)
Albumin: 1.5 g/dL — ABNORMAL LOW (ref 3.5–5.0)
Alkaline Phosphatase: 229 U/L — ABNORMAL HIGH (ref 38–126)
Anion gap: 10 (ref 5–15)
BUN: 10 mg/dL (ref 6–20)
CO2: 16 mmol/L — ABNORMAL LOW (ref 22–32)
Calcium: 6.6 mg/dL — ABNORMAL LOW (ref 8.9–10.3)
Chloride: 116 mmol/L — ABNORMAL HIGH (ref 98–111)
Creatinine, Ser: 1.24 mg/dL — ABNORMAL HIGH (ref 0.44–1.00)
GFR calc Af Amer: 58 mL/min — ABNORMAL LOW (ref >60)
GFR calc non Af Amer: 50 mL/min — ABNORMAL LOW (ref >60)
Glucose, Bld: 87 mg/dL (ref 70–99)
Potassium: 3.8 mmol/L (ref 3.5–5.1)
Sodium: 142 mmol/L (ref 135–145)
Total Bilirubin: 1 mg/dL (ref 0.3–1.2)
Total Protein: 5.6 g/dL — ABNORMAL LOW (ref 6.5–8.1)

## 2020-01-16 LAB — PROTIME-INR
INR: 2.5 — ABNORMAL HIGH (ref 0.8–1.2)
Prothrombin Time: 25.8 seconds — ABNORMAL HIGH (ref 11.4–15.2)

## 2020-01-16 LAB — PHOSPHORUS: Phosphorus: 2.2 mg/dL — ABNORMAL LOW (ref 2.5–4.6)

## 2020-01-16 LAB — CALCIUM, IONIZED: Calcium, Ionized, Serum: 4 mg/dL — ABNORMAL LOW (ref 4.5–5.6)

## 2020-01-16 LAB — MAGNESIUM: Magnesium: 1.6 mg/dL — ABNORMAL LOW (ref 1.7–2.4)

## 2020-01-16 MED ORDER — MORPHINE SULFATE (PF) 2 MG/ML IV SOLN
2.0000 mg | INTRAVENOUS | Status: DC | PRN
  Administered 2020-01-17 – 2020-01-19 (×13): 2 mg via INTRAVENOUS
  Filled 2020-01-16 (×13): qty 1

## 2020-01-16 MED ORDER — LORAZEPAM 2 MG/ML IJ SOLN
0.5000 mg | INTRAMUSCULAR | Status: DC | PRN
  Administered 2020-01-16 – 2020-01-17 (×5): 0.5 mg via INTRAVENOUS
  Filled 2020-01-16 (×5): qty 1

## 2020-01-16 MED ORDER — DEXTROSE-NACL 5-0.9 % IV SOLN: INTRAVENOUS | Status: DC

## 2020-01-16 MED ORDER — LOPERAMIDE HCL 2 MG PO CAPS: 2.0000 mg | ORAL_CAPSULE | ORAL | Status: DC | PRN

## 2020-01-16 MED ORDER — MAGNESIUM SULFATE 2 GM/50ML IV SOLN
2.0000 g | Freq: Once | INTRAVENOUS | Status: AC
  Administered 2020-01-16: 2 g via INTRAVENOUS
  Filled 2020-01-16: qty 50

## 2020-01-16 NOTE — Progress Notes (Signed)
Daily Progress Note   Patient Name: Melissa Hurley       Date: 01/16/2020 DOB: 1966/02/06  Age: 54 y.o. MRN#: [id number] Attending Physician: Barb Merino, MD Primary Care Physician: Patient, No Pcp Per Admit Date: 12/23/2019  Reason for Consultation/Follow-up: Establishing goals of care and Terminal Care  Subjective: Patient awake but disoriented and irritable. Continues to ask to get out of bed and walk. When husband and I tell her no, she gets irritated and states "get the hell Leretha Dykes here." She denies current pain. She is refusing bites and sips.   GOC:  Spoke with husband, Chrissie Noa, in conference room to discuss goals of care. Introduced role of palliative medicine.   Discussed his conversation with Dr. Sloan Leiter this morning. Chrissie Noa is still trying to process this conversation and how quickly Candina has declined in 24 hours. Reviewed CT results, oncology recommendations, and attending recommendations. Frankly and compassionately discussed poor prognosis and recommendation for hospice services. Discussed hospice options and philosophy.   Chrissie Noa is not yet ready to make decision on DNR this afternoon and shares that this means he is 'giving up hope' and not his decision to make. He does ask multiple questions about hospice options and shares that hospice facility will likely be best because he is unable to be with Lelar 24 hours a day and also this will be hard for their 17 year old daughter to see her mother like this.   During our conversation, Kieley is heard yelling. She is asking to get on the side of the bed. Chrissie Noa and I assisted her to side of bed safely. She declines food or drink. She declines pain. RN instructed to give prn ativan. Assisted Chrissie Noa with repositioning her back  in bed.  Chrissie Noa and I plan to further discuss this conversation and his decisions tomorrow, 5/31 around 9am. Hard Choices booklet and PMT contact information given.   Length of Stay: 4  Current Medications: Scheduled Meds:  . Chlorhexidine Gluconate Cloth  6 each Topical Daily  . feeding supplement (PRO-STAT SUGAR FREE 64)  30 mL Oral BID  . metoCLOPramide (REGLAN) injection  5 mg Intravenous Q8H  . metoprolol tartrate  12.5 mg Oral BID  . mirtazapine  7.5 mg Oral QHS  . pantoprazole  40 mg Oral Daily  . senna-docusate  1 tablet  Oral BID    Continuous Infusions: . dextrose 5 % and 0.9% NaCl 75 mL/hr at 01/16/20 1843    PRN Meds: acetaminophen **OR** acetaminophen (TYLENOL) oral liquid 160 mg/5 mL **OR** acetaminophen, loperamide, LORazepam, morphine injection, ondansetron (ZOFRAN) IV  Physical Exam Vitals and nursing note reviewed.  Constitutional:      General: She is awake.     Appearance: She is ill-appearing.  HENT:     Head: Normocephalic and atraumatic.  Pulmonary:     Effort: No tachypnea, accessory muscle usage or respiratory distress.  Abdominal:     Tenderness: There is no abdominal tenderness.  Skin:    General: Skin is warm and dry.  Neurological:     Comments: Awake, disoriented, irritable            Vital Signs: BP (!) 145/77 (BP Location: Right Arm)   Pulse (!) 127   Temp 97.6 F (36.4 C) (Oral)   Resp 18   Ht 5\' 7"  (1.702 m)   Wt 93.8 kg   LMP 10/25/2014   SpO2 100%   BMI 32.39 kg/m  SpO2: SpO2: 100 % O2 Device: O2 Device: Room Air O2 Flow Rate:    Intake/output summary:   Intake/Output Summary (Last 24 hours) at 01/16/2020 2236 Last data filed at 01/16/2020 1500 Gross per 24 hour  Intake 240 ml  Output -  Net 240 ml   LBM: Last BM Date: 01/15/20 Baseline Weight: Weight: 93.8 kg Most recent weight: Weight: 93.8 kg       Palliative Assessment/Data: PPS 20%    Flowsheet Rows     Most Recent Value  Intake Tab  Referral  Department  Hospitalist  Unit at Time of Referral  Med/Surg Unit  Palliative Care Primary Diagnosis  Cancer  Date Notified  01/15/20  Palliative Care Type  New Palliative care  Reason for referral  Clarify Goals of Care, Psychosocial or Spiritual support  Date of Admission  12/19/2019  Date first seen by Palliative Care  01/15/20  # of days Palliative referral response time  0 Day(s)  # of days IP prior to Palliative referral  3  Clinical Assessment  Palliative Performance Scale Score  20%  Pain Max last 24 hours  Not able to report  Pain Min Last 24 hours  Not able to report  Dyspnea Max Last 24 Hours  Not able to report  Dyspnea Min Last 24 hours  Not able to report  Psychosocial & Spiritual Assessment  Palliative Care Outcomes  Patient/Family meeting held?  Yes  Who was at the meeting?  husband  Palliative Care Outcomes  Clarified goals of care, Counseled regarding hospice, Provided end of life care assistance, Provided psychosocial or spiritual support      Patient Active Problem List   Diagnosis Date Noted  . Acute metabolic encephalopathy 99991111  . Cholangiocarcinoma ([place])   . Anxiety state   . Protein-calorie malnutrition, severe 01/15/2020  . Goals of care, counseling/discussion   . Palliative care by specialist   . Bloodstream infection due to Port-A-Cath 01/06/2020  . Intracerebral hemorrhage ([place]) 01/14/2020  . PICC (peripherally inserted central catheter) in place 01/03/2020  . Malnutrition of moderate degree 12/18/2019  . Bacteremia   . Pericardial effusion   . Pancytopenia ([place]) 12/10/2019  . Nausea & vomiting 12/10/2019  . Dehydration 12/10/2019  . AKI (acute kidney injury) ([place]) 12/10/2019  . SIRS (systemic inflammatory response syndrome) ([place]) 12/10/2019  . Metastatic malignant neoplasm ([place]) 12/10/2019  . History of  CVA (cerebrovascular accident) 12/10/2019  . Hypokalemia 12/10/2019  . Hypocalcemia 12/10/2019  . Acute CVA (cerebrovascular accident)  ([place]) 10/25/2019  . Anemia 10/25/2019  . Fibroid uterus 08/27/2019    Palliative Care Assessment & Plan   Patient Profile: 54 y.o. female  with past medical history of metastatic intrahepatic cholangiocarcinoma stage 4, recent L MCA CVA, admit for bacteremia end of April at [facility], admitted on 01/15/2020 with hypotension, recent L MCA stroke, cholangiocarcinoma with mets/FTT, debility. MRI brain 5/26 did not reveal hemorrhagic conversion of previous infarcts. CT ab/pelvis and chest revealed progression of disease with increase in size and number of hepatic masses compared to prior CT, multiple bilateral pulmonary nodules consistent with metastatic disease, small ascites and anasarca. On 5/30, patient with worsening confusion, agitation and poor oral intake. Severe debility and failure to thrive. Attending, Dr Sloan Leiter recommending DNR, comfort focused care and hospice consideration with very poor prognosis.   Assessment: Metastatic cholangiocarcinoma with CT scan revealing progression Adult failure to thrive Hypotension Recent history of left MCA CVA Anemia Thrombocytopenia Weakness Poor oral intake  Recommendations/Plan:  Husband processing conversation with Dr Sloan Leiter and this PMT provider. No decisions made today, but husband does seem to understand diagnoses and poor prognosis. Husband considering DNR and hospice facility placement.  Continue Ativan prn anxiety  PMT provider will f/u with husband in AM, around 9am.   Code Status: FULL   Code Status Orders  (From admission, onward)         Start     Ordered   12/25/2019 1612  Full code  Continuous     01/11/2020 1613        Code Status History    Date Active Date Inactive Code Status Order ID Comments User Context   12/10/2019 1953 12/18/2019 2123 Full Code [id number]  Dessa Phi, DO ED   10/25/2019 2204 10/28/2019 1855 Full Code [id number]  Shela Leff, MD ED   Advance Care Planning Activity       Prognosis:   Poor  prognosis: weeks if not days with progressive metastatic cholangiocarcinoma, failure to thrive  Discharge Planning:  To Be Determined  Care plan was discussed with Dr Sloan Leiter, RN, husband  Thank you for allowing the Palliative Medicine Team to assist in the care of this patient.   Time In: 1620 Time Out: 1720 Total Time 60 Prolonged Time Billed no      Greater than 50%  of this time was spent counseling and coordinating care related to the above assessment and plan.  Ihor Dow, DNP, FNP-C Palliative Medicine Team  Phone: [phone] Fax: [phone]  Please contact Palliative Medicine Team phone at [phone] for questions and concerns.

## 2020-01-16 NOTE — Progress Notes (Signed)
SLP Cancellation Note  Patient Details Name: Melissa Hurley MRN: [id number] DOB: 07-03-1966   Cancelled treatment:  Pt sleeping soundly; will return this afternoon, if schedule allows, to evaluate swallowing.       Georges Victorio L. Tivis Ringer, [place] Office number [phone] Pager [phone]   Juan Quam Laurice 01/16/2020, 2:19 PM

## 2020-01-16 NOTE — Progress Notes (Signed)
PROGRESS NOTE    Melissa Hurley  JJH:[id number] DOB: 10/17/1965 DOA: 01/16/2020 PCP: Patient, No Pcp Per    Brief Narrative:  54 year old female with history of recently diagnosed metastatic cholangiocarcinoma on chemotherapy, recent bacteremia with Corynebacterium in the setting of Port-A-Cath infection that was removed and treated with vancomycin.  Left MCA CVA in March 2021 and currently on Eliquis.  She was following up at ID office on 5/26 and was found to be hypotensive and lethargic and sent to the ER.  Initial CT scan of the head suspected of hemorrhagic conversion of right MCA infarct so patient was admitted to neuro ICU. 5/26, admitted to neuro ICU from [facility] emergency room. CT head, 5/26 possible interval hemorrhage right MCA artery distribution infarct without mass-effect MRI of the brain 5/26, expected interval evolution of subacute cortical and subcortical infarcts involving bilateral cerebral hemispheres. Cultures negative. 5/29, continues to be extremely weak, shaky.  Hematuria. 5/30, remains more lethargic, confused, altered mentation.  Assessment & Plan:   Active Problems:   Metastatic malignant neoplasm (HCC)   Intracerebral hemorrhage (HCC)   Protein-calorie malnutrition, severe   Goals of care, counseling/discussion   Palliative care by specialist  Hypotension: From hypovolemia.  No evidence of sepsis.  Treated with maintenance IV fluids with improvement.  Cultures are negative.  PICC line to remain as she is potential chemotherapy candidate as requested by oncology.  Recent history of left MCA stroke with suspected hemorrhagic conversion: MRI negative for hemorrhagic conversion.  Eliquis resumed.  Seen by neurology and signed off.  Continue to work with PT OT. Now patient has frank hematuria, will hold off Eliquis. Patient remains severely debilitated.  Metastatic cholangiocarcinoma/failure to thrive/severe physical debility/ now not eating/ confused and  agitated: Status post 3 cycles of chemotherapy.  Followed by oncology.  Staging CT scans show more progression of disease. Given patient's advanced metastatic cholangiocarcinoma and failure to thrive with severe debility, patient is probably at her end-of-life.  Followed by her oncology. We will consult palliative care team to coordinate care.  Anemia, thrombocytopenia in the setting of malignancy: low but stable, no need for transfusion today.  Hypokalemia: Replaced aggressively with improvement.  Hypocalcemia: Corrected calcium more than 8.  No indication for replacement.  Goal of care: Patient with advanced cancer and debility, probably at her end of life. Patient with worsening clinical status and now even not eating. She has been more anxious, agitated now. Met with patient's husband at bedside and then met separately. Asked with patient husband about patients poor clinical status, CT scan evidence with worsening metastatic disease, severe debility and now with failure to thrive and not eating and probably reaching her end-of-life. Hopefully, oncology will also give them prognosis. I explained different approach including involvement of palliative care and hospice.  Patient's husband was obviously very emotional and he will take some time to process through. Encouraged him to talk to other family members about the seriousness of condition.  Apparently, patient has none told any of her side of family about this diagnosis.  Only 2 of them know about the cancer diagnosis. Also discussed with palliative care provider for follow-up. Encouraged patient's husband to change CODE STATUS to no CPR, he will get back to me in the later afternoon. Patient will probably benefit with home with hospice.  DVT prophylaxis: SCD,  Code Status: Full code Family Communication: Patient's husband. Disposition Plan: Status is: Inpatient  Remains inpatient appropriate because:Getting CT scans, staging of  cancer and prognostication discussion.  Dispo: The patient is from: Home              Anticipated d/c is to: Home              Anticipated d/c date is: 2 days              Patient currently is not medically stable to d/c.  Patient is stabilizing.  Oncology is following.  They will discuss prognosis with patient and family. Depends upon patient's further oncology plan, probable home with home hospice. Prognosis is poor.  Consultants:   Oncology  Neurology  PCCM  Procedures:   None  Antimicrobials:  Antibiotics Given (last 72 hours)    None         Subjective: Patient seen and examined.  Overnight anxious and at times agitated frustrated.  Met with the patient with husband at the bedside. She had difficult time expressing.  Looks withdrawn and anxious. She tried very hard to express and only she could ask me is anxiety medicine. Patient has not eaten since last dinner.  She has no appetite.  No nausea or vomiting.  He still has some blood in the catheter.+  Objective: Vitals:   01/16/20 0200 01/16/20 0445 01/16/20 1149 01/16/20 1200  BP: 138/69 129/75 (!) 121/98 127/85  Pulse: (!) 110 (!) 105 (!) 115 (!) 105  Resp: _0 Temp: 98 F (36.7 C) 98.1 F (36.7 C) (!) 97.5 F (36.4 C) 97.6 F (36.4 C)  TempSrc: Oral Oral Axillary Axillary  SpO2: 100% 100% 100%   Weight:      Height:       No intake or output data in the 24 hours ending 01/16/20 1420 Filed Weights   01/07/2020 2020  Weight: 93.8 kg    Examination:  General exam: Sick looking, extremely debilitated, anxious, delirious at times. Respiratory system: Clear to auscultation. Respiratory effort normal. Cardiovascular system: S1 & S2 heard, RRR. No JVD, murmurs, rubs, gallops or clicks.  2+ pedal edema with tenderness. Gastrointestinal system: Abdomen is nondistended, soft and nontender.  Central nervous system: Mostly sleepy and lethargic.  Oriented x1-2. Extremities: Symmetric 5 x 5 power. Skin:  No rashes, lesions or ulcers. Psychiatry: Judgement and insight appear normal. Mood & affect flat and anxious.    Data Reviewed: I have personally reviewed following labs and imaging studies  CBC: Recent Labs  Lab 01/11/2020 1348 01/04/2020 2222 01/13/20 0503 01/14/20 0507 01/16/20 0812  WBC 11.2*  --  10.2 10.9* 11.4*  NEUTROABS 7.9*  --   --   --  8.5*  HGB 6.9* 8.2* 8.1* 7.6* 7.3*  HCT 23.6* 27.3* 26.8* 25.1* 24.1*  MCV 87.7  --  86.2 86.9 86.4  PLT 138*  --  105* 95* 81*   Basic Metabolic Panel: Recent Labs  Lab 01/05/2020 1348 01/13/20 0503 01/13/20 1853 01/14/20 0507 01/14/20 2100 01/15/20 0500 01/15/20 0837 01/16/20 0812  NA 141   < > 141 139 140  --  142 142  K 3.3*   < > 4.0 3.0* 3.8  --  3.5 3.8  CL 110   < > 113* 115* 112*  --  116* 116*  CO2 22   < > 20* 18* 18*  --  17* 16*  GLUCOSE 101*   < > 85 108* 104*  --  99 87  BUN 16   < > _1 --  11 10  CREATININE 1.28*   < > 1.29*  1.18* 1.30*  --  1.20* 1.24*  CALCIUM 6.3*   < > 6.4* 6.4* 6.6*  --  6.5* 6.6*  MG 1.1*  --   --  2.1  --  1.6*  --  1.6*  PHOS  --   --  2.5 2.5  --  1.9*  --  2.2*   < > = values in this interval not displayed.   GFR: Estimated Creatinine Clearance: 62.4 mL/min (A) (by C-G formula based on SCr of 1.24 mg/dL (H)). Liver Function Tests: Recent Labs  Lab 01/15/2020 1348 01/13/20 0503 01/16/20 0812  AST 33 25 24  ALT _0 ALKPHOS 246* 215* 229*  BILITOT 1.3* 1.1 1.0  PROT 5.7* 5.1* 5.6*  ALBUMIN 1.9* 1.4* 1.5*   No results for input(s): LIPASE, AMYLASE in the last 168 hours. Recent Labs  Lab 12/30/2019 1348  AMMONIA 25   Coagulation Profile: Recent Labs  Lab 01/10/2020 1348 01/16/20 0812  INR 3.0* 2.5*   Cardiac Enzymes: No results for input(s): CKTOTAL, CKMB, CKMBINDEX, TROPONINI in the last 168 hours. BNP (last 3 results) No results for input(s): PROBNP in the last 8760 hours. HbA1C: No results for input(s): HGBA1C in the last 72 hours. CBG: No results for  input(s): GLUCAP in the last 168 hours. Lipid Profile: No results for input(s): CHOL, HDL, LDLCALC, TRIG, CHOLHDL, LDLDIRECT in the last 72 hours. Thyroid Function Tests: No results for input(s): TSH, T4TOTAL, FREET4, T3FREE, THYROIDAB in the last 72 hours. Anemia Panel: No results for input(s): VITAMINB12, FOLATE, FERRITIN, TIBC, IRON, RETICCTPCT in the last 72 hours. Sepsis Labs: Recent Labs  Lab 12/26/2019 1348 01/11/2020 1735  LATICACIDVEN 3.0* 3.5*    Recent Results (from the past 240 hour(s))  Urine culture     Status: None   Collection Time: 12/18/2019 12:56 PM   Specimen: In/Out Cath Urine  Result Value Ref Range Status   Specimen Description   Final    IN/OUT CATH URINE Performed at [facility], [place] [address] [place], [place] [address]    Special Requests   Final    NONE Performed at [facility], [place] [address], [place], [place] [address]    Culture   Final    NO GROWTH Performed at [facility], [place] [address] [place], [place] [address]    Report Status 01/14/2020 FINAL  Final  SARS Coronavirus 2 by RT PCR (hospital order, performed in [facility] hospital lab) Nasopharyngeal Nasopharyngeal Swab     Status: None   Collection Time: 01/01/2020  1:48 PM   Specimen: Nasopharyngeal Swab  Result Value Ref Range Status   SARS Coronavirus 2 NEGATIVE NEGATIVE Final    Comment: (NOTE) SARS-CoV-2 target nucleic acids are NOT DETECTED. The SARS-CoV-2 RNA is generally detectable in upper and lower respiratory specimens during the acute phase of infection. The lowest concentration of SARS-CoV-2 viral copies this assay can detect is 250 copies / mL. A negative result does not preclude SARS-CoV-2 infection and should not be used as the sole basis for treatment or other patient management decisions.  A negative result may occur with improper specimen collection / handling, submission of specimen other than nasopharyngeal swab, presence  of viral mutation(s) within the areas targeted by this assay, and inadequate number of viral copies (<250 copies / mL). A negative result must be combined with clinical observations, patient history, and epidemiological information. Fact Sheet for Patients:   StrictlyIdeas.no Fact Sheet for Healthcare Providers: BankingDealers.co.za  This test is not yet approved or cleared  by the [place] and has been authorized for detection and/or diagnosis of SARS-CoV-2 by FDA under an Emergency Use Authorization (EUA).  This EUA will remain in effect (meaning this test can be used) for the duration of the COVID-19 declaration under Section 564(b)(1) of the Act, 21 U.S.C. section 360bbb-3(b)(1), unless the authorization is terminated or revoked sooner. Performed at [facility], [place] [address], [place], [place] [address]   Blood Culture (routine x 2)     Status: None (Preliminary result)   Collection Time: 01/03/2020  2:00 PM   Specimen: BLOOD  Result Value Ref Range Status   Specimen Description BLOOD SITE NOT SPECIFIED  Final   Special Requests   Final    BOTTLES DRAWN AEROBIC AND ANAEROBIC Blood Culture adequate volume Performed at [place] [address], [place], [place] [address]    Culture   Final    NO GROWTH 3 DAYS Performed at [facility], [place] [address], [place], [place] [address]    Report Status PENDING  Incomplete  MRSA PCR Screening     Status: None   Collection Time: 01/13/2020  8:40 PM   Specimen: Nasal Mucosa; Nasopharyngeal  Result Value Ref Range Status   MRSA by PCR NEGATIVE NEGATIVE Final    Comment:        The GeneXpert MRSA Assay (FDA approved for NASAL specimens only), is one component of a comprehensive MRSA colonization surveillance program. It is not intended to diagnose MRSA infection nor to guide or monitor treatment for MRSA infections. Performed at  [facility], [place] [address], [place], [place] [address]          Radiology Studies: CT CHEST W CONTRAST  Result Date: 01/15/2020 CLINICAL DATA:  54 year old female with metastatic cholangiocarcinoma. Patient is on chemotherapy. EXAM: CT CHEST, ABDOMEN, AND PELVIS WITH CONTRAST TECHNIQUE: Multidetector CT imaging of the chest, abdomen and pelvis was performed following the standard protocol during bolus administration of intravenous contrast. CONTRAST:  118m OMNIPAQUE IOHEXOL 300 MG/ML  SOLN COMPARISON:  CT abdomen pelvis dated 07/30/2019. FINDINGS: Evaluation is limited due to streak artifact caused by patient's arms. CT CHEST FINDINGS Cardiovascular: There is no cardiomegaly. There is a small pericardial effusion measuring approximately 5 mm in thickness. Mild atherosclerotic calcification of the aortic arch. No aneurysmal dilatation or dissection. The origins of the great vessels of the aortic arch appear patent as visualized. Left-sided PICC with tip in the upper SVC. The central pulmonary arteries appear unremarkable. Mediastinum/Nodes: No hilar or mediastinal adenopathy. The esophagus is grossly unremarkable. No mediastinal fluid collection. Lungs/Pleura: Moderate right and small left pleural effusions, significantly increased in size since the CT of 07/30/2019. There is associated partial compressive atelectasis of the lower lobes. Pneumonia is not excluded. Multiple bilateral pulmonary nodules measure up to 6 mm in the left lower lobe consistent with metastatic disease. There is no pneumothorax. The central airways are patent. Musculoskeletal: Indeterminate 2 cm lucent lesion in the right humeral head, possibly a bone cyst. Other etiologies are not excluded. No acute osseous pathology. CT ABDOMEN PELVIS FINDINGS Hepatobiliary: Multiple heterogeneously enhancing liver masses with the largest mass measuring approximately 12 x 10 cm (49/3). The largest mass demonstrates predominantly  peripheral enhancement with areas of lower enhancement centrally, likely representing necrotic tissue. Several additional liver masses noted some of which are new and others have increased in size since the prior CT. There is  probable background of fatty liver. No intrahepatic biliary ductal dilatation. No calcified gallstone. Pancreas: Unremarkable. No pancreatic ductal dilatation or surrounding inflammatory changes. Spleen: Normal in size without focal abnormality. Adrenals/Urinary Tract: The adrenal glands unremarkable. There is no hydronephrosis on either side. There is symmetric enhancement and excretion of contrast by both kidneys. Bilateral parapelvic cysts noted. The visualized ureters and urinary bladder are unremarkable. Stomach/Bowel: Diffuse thickened appearance of the colon, likely related to underdistention and ascites. Colitis is less likely but not excluded clinical correlation is recommended. There is no bowel obstruction. The appendix is normal. Vascular/Lymphatic: The abdominal aorta and IVC are unremarkable. No portal venous gas. Periportal adenopathy measures 13 mm in short axis (308/4). Multiple rounded and mildly enlarged top-normal retroperitoneal and para-aortic lymph nodes. Reproductive: The uterus is enlarged and myomatous. There is dilatation of the endometrial canal with irregularity of the endometrium which may represent proteinaceous or blood product or intracavitary fibroid. Other etiologies are not excluded. This can be better evaluated with MRI. Other: Interval development of small ascites, new since the prior CT. There is diffuse subcutaneous edema and anasarca. Musculoskeletal: Degenerative changes of the spine. No acute osseous pathology. IMPRESSION: 1. Overall progression of disease with increase in the size and number of the hepatic masses compared to the prior CT. 2. Multiple bilateral pulmonary nodules consistent with metastatic disease. 3. Interval development of small  ascites and anasarca, new since the prior CT. 4. Bilateral pleural effusions, increased since the prior CT. 5. Diffuse thickened appearance of the colon likely related to underdistention and ascites. Colitis is less likely but not excluded. Clinical correlation is recommended. No bowel obstruction. Normal appendix. 6. Enlarged myomatous uterus. Thickened and irregular appearance of the endometrial canal may represent proteinaceous or blood product or intracavitary fibroid. Other etiologies are not excluded. This can be better evaluated with MRI. 7. Aortic Atherosclerosis (ICD10-I70.0). Electronically Signed   By: Anner Crete M.D.   On: 01/15/2020 15:30   CT ABDOMEN PELVIS W CONTRAST  Result Date: 01/15/2020 CLINICAL DATA:  54 year old female with metastatic cholangiocarcinoma. Patient is on chemotherapy. EXAM: CT CHEST, ABDOMEN, AND PELVIS WITH CONTRAST TECHNIQUE: Multidetector CT imaging of the chest, abdomen and pelvis was performed following the standard protocol during bolus administration of intravenous contrast. CONTRAST:  116m OMNIPAQUE IOHEXOL 300 MG/ML  SOLN COMPARISON:  CT abdomen pelvis dated 07/30/2019. FINDINGS: Evaluation is limited due to streak artifact caused by patient's arms. CT CHEST FINDINGS Cardiovascular: There is no cardiomegaly. There is a small pericardial effusion measuring approximately 5 mm in thickness. Mild atherosclerotic calcification of the aortic arch. No aneurysmal dilatation or dissection. The origins of the great vessels of the aortic arch appear patent as visualized. Left-sided PICC with tip in the upper SVC. The central pulmonary arteries appear unremarkable. Mediastinum/Nodes: No hilar or mediastinal adenopathy. The esophagus is grossly unremarkable. No mediastinal fluid collection. Lungs/Pleura: Moderate right and small left pleural effusions, significantly increased in size since the CT of 07/30/2019. There is associated partial compressive atelectasis of the  lower lobes. Pneumonia is not excluded. Multiple bilateral pulmonary nodules measure up to 6 mm in the left lower lobe consistent with metastatic disease. There is no pneumothorax. The central airways are patent. Musculoskeletal: Indeterminate 2 cm lucent lesion in the right humeral head, possibly a bone cyst. Other etiologies are not excluded. No acute osseous pathology. CT ABDOMEN PELVIS FINDINGS Hepatobiliary: Multiple heterogeneously enhancing liver masses with the largest mass measuring approximately 12 x 10 cm (49/3). The largest mass demonstrates predominantly peripheral  enhancement with areas of lower enhancement centrally, likely representing necrotic tissue. Several additional liver masses noted some of which are new and others have increased in size since the prior CT. There is probable background of fatty liver. No intrahepatic biliary ductal dilatation. No calcified gallstone. Pancreas: Unremarkable. No pancreatic ductal dilatation or surrounding inflammatory changes. Spleen: Normal in size without focal abnormality. Adrenals/Urinary Tract: The adrenal glands unremarkable. There is no hydronephrosis on either side. There is symmetric enhancement and excretion of contrast by both kidneys. Bilateral parapelvic cysts noted. The visualized ureters and urinary bladder are unremarkable. Stomach/Bowel: Diffuse thickened appearance of the colon, likely related to underdistention and ascites. Colitis is less likely but not excluded clinical correlation is recommended. There is no bowel obstruction. The appendix is normal. Vascular/Lymphatic: The abdominal aorta and IVC are unremarkable. No portal venous gas. Periportal adenopathy measures 13 mm in short axis (308/4). Multiple rounded and mildly enlarged top-normal retroperitoneal and para-aortic lymph nodes. Reproductive: The uterus is enlarged and myomatous. There is dilatation of the endometrial canal with irregularity of the endometrium which may represent  proteinaceous or blood product or intracavitary fibroid. Other etiologies are not excluded. This can be better evaluated with MRI. Other: Interval development of small ascites, new since the prior CT. There is diffuse subcutaneous edema and anasarca. Musculoskeletal: Degenerative changes of the spine. No acute osseous pathology. IMPRESSION: 1. Overall progression of disease with increase in the size and number of the hepatic masses compared to the prior CT. 2. Multiple bilateral pulmonary nodules consistent with metastatic disease. 3. Interval development of small ascites and anasarca, new since the prior CT. 4. Bilateral pleural effusions, increased since the prior CT. 5. Diffuse thickened appearance of the colon likely related to underdistention and ascites. Colitis is less likely but not excluded. Clinical correlation is recommended. No bowel obstruction. Normal appendix. 6. Enlarged myomatous uterus. Thickened and irregular appearance of the endometrial canal may represent proteinaceous or blood product or intracavitary fibroid. Other etiologies are not excluded. This can be better evaluated with MRI. 7. Aortic Atherosclerosis (ICD10-I70.0). Electronically Signed   By: Anner Crete M.D.   On: 01/15/2020 15:30        Scheduled Meds: . Chlorhexidine Gluconate Cloth  6 each Topical Daily  . feeding supplement (PRO-STAT SUGAR FREE 64)  30 mL Oral BID  . metoCLOPramide (REGLAN) injection  5 mg Intravenous Q8H  . metoprolol tartrate  12.5 mg Oral BID  . mirtazapine  7.5 mg Oral QHS  . pantoprazole  40 mg Oral Daily  . senna-docusate  1 tablet Oral BID   Continuous Infusions: . sodium chloride 75 mL/hr at 01/16/20 0444     LOS: 4 days    Time spent: 30 minutes     Barb Merino, MD [facility] Pager [phone]

## 2020-01-16 NOTE — Progress Notes (Signed)
Met with patient's husband again in the evening.  Patient is lethargic and sleepy all day, very difficult to arouse.  Hardly could keep up some conversation.  Did not eat any food.  He had just met with palliative care provider. He is still processing through everything.  He is also convinced that patient is dying soon.  Plan: Patient in terminal stage of her life.  No CPR.  Changed to DNR. Discussed changing to comfort care measures, decided to continue IV fluids tonight, to see if her other family can visit. If patient to decompensate overnight, husband to be notified and comfort care to be provided. Decided to call hospice in the morning for inpatient hospice.  We will see if she will be stable enough to transfer to hospice otherwise hospital death anticipated. Liberalize visitor policies, husband cannot stay off hours. If her other families to visit, will allow them to visit.

## 2020-01-16 NOTE — Consult Note (Signed)
Responded to request from nurse that pt was agitated before arrival of husband and may welcome chaplain visit and prayer. Sat with pt a short while, who initially communicated by writing on a pad. After short prayer, pt desired to rest. She did not know when her husband was coming and may be conserving her strength to see him.  Rev. Eloise Levels

## 2020-01-17 MED ORDER — LORAZEPAM 2 MG/ML IJ SOLN
1.0000 mg | INTRAMUSCULAR | Status: DC | PRN
  Administered 2020-01-17 – 2020-01-19 (×7): 1 mg via INTRAVENOUS
  Filled 2020-01-17 (×7): qty 1

## 2020-01-17 NOTE — Progress Notes (Signed)
SLP Cancellation Note  Patient Details Name: Melissa Hurley MRN: [id number] DOB: [birth year]   Cancelled treatment:       Reason Eval/Treat Not Completed: Other (comment). Given plan to focus on comfort and pts limited PO intake, no real benefit of SLP services at this time. Will sign off.    Jayme Mednick, Katherene Ponto 01/17/2020, 7:25 AM

## 2020-01-17 NOTE — Progress Notes (Signed)
Daily Progress Note   Patient Name: Melissa Hurley       Date: 01/17/2020 DOB: 1965/10/23  Age: 54 y.o. MRN#: [id number] Attending Physician: Barb Merino, MD Primary Care Physician: Patient, No Pcp Per Admit Date: 01/13/2020  Reason for Consultation/Follow-up: Establishing goals of care and Terminal Care  Subjective: Easily aroused but disoriented and agitated. Slight dyspnea with accessory muscle use. RN at bedside giving prn morphine and ativan.    GOC:  F/u GOC with patient's husband, Chrissie Noa. Dr. Sloan Leiter also at bedside this morning.  Reviewed diagnoses, interventions, and poor prognosis. Husband is still in disbelief with how quickly she has declined. He is still considering hospice facility but would like to speak with oncology before making a final decision. He would like to continue IVF until sisters arrive. Encouraged sisters visit today/sooner than later with poor prognosis. Discussed comfort focused care including discontinuation of interventions not aimed at comfort and focus on symptom management. Chrissie Noa does agree to continue comfort medications to ensure relief from pain and suffering. Discussed comfort medications, comfort feeds, and visitor policy. Offered spiritual care support. Educated on EOL expectations. Husband has PMT contact information.   Length of Stay: 5  Current Medications: Scheduled Meds:  . Chlorhexidine Gluconate Cloth  6 each Topical Daily  . feeding supplement (PRO-STAT SUGAR FREE 64)  30 mL Oral BID  . metoCLOPramide (REGLAN) injection  5 mg Intravenous Q8H  . metoprolol tartrate  12.5 mg Oral BID  . mirtazapine  7.5 mg Oral QHS  . pantoprazole  40 mg Oral Daily  . senna-docusate  1 tablet Oral BID    Continuous Infusions: . dextrose 5 % and  0.9% NaCl 75 mL/hr at 01/16/20 1843    PRN Meds: acetaminophen **OR** acetaminophen (TYLENOL) oral liquid 160 mg/5 mL **OR** acetaminophen, loperamide, LORazepam, morphine injection, ondansetron (ZOFRAN) IV  Physical Exam Vitals and nursing note reviewed.  Constitutional:      Appearance: She is ill-appearing.  HENT:     Head: Normocephalic and atraumatic.  Pulmonary:     Effort: No tachypnea, accessory muscle usage or respiratory distress.  Abdominal:     Tenderness: There is no abdominal tenderness.  Skin:    General: Skin is warm and dry.  Neurological:     Mental Status: She is easily aroused.  Comments: Easily aroused but agitated. Disoriented.            Vital Signs: BP (!) 121/109   Pulse (!) 122   Temp 98 F (36.7 C) (Oral)   Resp 18   Ht 5\' 7"  (1.702 m)   Wt 93.8 kg   LMP 10/25/2014   SpO2 100%   BMI 32.39 kg/m  SpO2: SpO2: 100 % O2 Device: O2 Device: Room Air O2 Flow Rate:    Intake/output summary:   Intake/Output Summary (Last 24 hours) at 01/17/2020 [id number] Last data filed at 01/16/2020 1500 Gross per 24 hour  Intake 240 ml  Output -  Net 240 ml   LBM: Last BM Date: 01/15/20 Baseline Weight: Weight: 93.8 kg Most recent weight: Weight: 93.8 kg       Palliative Assessment/Data: PPS 10%    Flowsheet Rows     Most Recent Value  Intake Tab  Referral Department  Hospitalist  Unit at Time of Referral  Med/Surg Unit  Palliative Care Primary Diagnosis  Cancer  Date Notified  01/15/20  Palliative Care Type  New Palliative care  Reason for referral  Clarify Goals of Care, Psychosocial or Spiritual support  Date of Admission  01/11/2020  Date first seen by Palliative Care  01/15/20  # of days Palliative referral response time  0 Day(s)  # of days IP prior to Palliative referral  3  Clinical Assessment  Palliative Performance Scale Score  10%  Pain Max last 24 hours  Not able to report  Pain Min Last 24 hours  Not able to report  Dyspnea Max Last 24  Hours  Not able to report  Dyspnea Min Last 24 hours  Not able to report  Psychosocial & Spiritual Assessment  Palliative Care Outcomes  Patient/Family meeting held?  Yes  Who was at the meeting?  husband  Palliative Care Outcomes  Clarified goals of care, Counseled regarding hospice, Provided end of life care assistance, Provided psychosocial or spiritual support      Patient Active Problem List   Diagnosis Date Noted  . Acute metabolic encephalopathy [id number]  . Cholangiocarcinoma ([place])   . Anxiety state   . Protein-calorie malnutrition, severe 01/15/2020  . Goals of care, counseling/discussion   . Palliative care by specialist   . Bloodstream infection due to Port-A-Cath 12/21/2019  . Intracerebral hemorrhage ([place]) 01/06/2020  . PICC (peripherally inserted central catheter) in place 01/03/2020  . Malnutrition of moderate degree 12/18/2019  . Bacteremia   . Pericardial effusion   . Pancytopenia ([place]) 12/10/2019  . Nausea & vomiting 12/10/2019  . Dehydration 12/10/2019  . AKI (acute kidney injury) ([place]) 12/10/2019  . SIRS (systemic inflammatory response syndrome) ([place]) 12/10/2019  . Metastatic malignant neoplasm ([place]) 12/10/2019  . History of CVA (cerebrovascular accident) 12/10/2019  . Hypokalemia 12/10/2019  . Hypocalcemia 12/10/2019  . Acute CVA (cerebrovascular accident) ([place]) 10/25/2019  . Anemia 10/25/2019  . Fibroid uterus 08/27/2019    Palliative Care Assessment & Plan   Patient Profile: 54 y.o. female  with past medical history of metastatic intrahepatic cholangiocarcinoma stage 4, recent L MCA CVA, admit for bacteremia end of April at [facility], admitted on 01/05/2020 with hypotension, recent L MCA stroke, cholangiocarcinoma with mets/FTT, debility. MRI brain 5/26 did not reveal hemorrhagic conversion of previous infarcts. CT ab/pelvis and chest revealed progression of disease with increase in size and number of hepatic masses compared to prior CT, multiple bilateral  pulmonary nodules consistent with metastatic disease, small ascites and anasarca.  On 5/30, patient with worsening confusion, agitation and poor oral intake. Severe debility and failure to thrive. Attending, Dr Sloan Leiter recommending DNR, comfort focused care and hospice consideration with very poor prognosis.   Assessment: Metastatic cholangiocarcinoma with CT scan revealing progression Adult failure to thrive Hypotension Recent history of left MCA CVA Anemia Thrombocytopenia Weakness Poor oral intake  Recommendations/Plan:  Transitioning towards comfort focused care.   DNR now.  Pending oncology follow-up this afternoon. Husband requesting to speak with oncologist before making final decision for hospice facility placement.   Comfort meds on MAR.   Comfort feeds per patient/family request. Aspiration precautions.  Husband wishes to continue IVF until sisters arrive. Education provided on life-prolonging nature of fluids as well as risk for fluid overload & further discomfort.  Unrestricted visitor access. Please allow patient's family to visit as she is declining quickly and nearing EOL.    Code Status: DNR   Code Status Orders  (From admission, onward)         Start     Ordered   01/14/2020 1612  Full code  Continuous     12/18/2019 1613        Code Status History    Date Active Date Inactive Code Status Order ID Comments User Context   12/10/2019 1953 12/18/2019 2123 Full Code [id number]  Dessa Phi, DO ED   10/25/2019 2204 10/28/2019 1855 Full Code [id number]  Shela Leff, MD ED   Advance Care Planning Activity       Prognosis:   Poor prognosis: Likely days with progressive metastatic cholangiocarcinoma, failure to thrive  Discharge Planning: To Be Determined : Hospice facility versus hospital death  Care plan was discussed with Dr Sloan Leiter, RN, husband  Thank you for allowing the Palliative Medicine Team to assist in the care of this patient.   Time  In: 0855 Time Out: 0920 Total Time 25 Prolonged Time Billed no    Greater than 50% of this time was spent counseling and coordinated care related to the above assessment and plan.   Ihor Dow, DNP, FNP-C  Palliative Medicine Team  Phone: [phone] Fax: [phone]  Please contact Palliative Medicine Team phone at [phone] for questions and concerns.

## 2020-01-17 NOTE — Progress Notes (Addendum)
IP PROGRESS NOTE  Subjective:   Melissa Hurley has been followed at [facility] and by Dr. Lorenso Courier with a diagnosis of metastatic cholangiocarcinoma.  She was last treated with gemcitabine/cisplatin chemotherapy on 12/01/2019.  She was admitted with severe pancytopenia and bacteremia last month.  Melissa Hurley was readmitted 12/26/2019 with hypotension, malaise, and altered mental status.  She is now minimally responsive.  Her husband is at the bedside.  She reports her clinical status has declined for several weeks.  She was ambulatory with assistance prior to hospital admission.  Objective: Vital signs in last 24 hours: Blood pressure 134/80, pulse (!) 118, temperature 98 F (36.7 C), resp. rate (!) 22, height 5\' 7"  (1.702 m), weight 206 lb 12.7 oz (93.8 kg), last menstrual period 10/25/2014, SpO2 100 %.  Intake/Output from previous day: 05/30 0701 - 05/31 0700 In: 240 [P.O.:240] Out: -   Physical Exam:  HEENT: No thrush Lungs: End expiratory wheeze at the bilateral anterior chest, no respiratory distress Cardiac: Regular rate and rhythm Abdomen: Distended, no discrete mass, fullness in the upper abdomen Extremities: Anasarca Neurologic: Lethargic, opens eyes with sternal rub, nonverbal, moves extremities, not following commands  Portacath/PICC-without erythema  Lab Results: Recent Labs    01/16/20 0812  WBC 11.4*  HGB 7.3*  HCT 24.1*  PLT 81*    BMET Recent Labs    01/15/20 0837 01/16/20 0812  NA 142 142  K 3.5 3.8  CL 116* 116*  CO2 17* 16*  GLUCOSE 99 87  BUN 11 10  CREATININE 1.20* 1.24*  CALCIUM 6.5* 6.6*    Lab Results  Component Value Date   CEA1 <1.00 08/06/2019    Studies/Results: CT CHEST W CONTRAST  Result Date: 01/15/2020 CLINICAL DATA:  54 year old female with metastatic cholangiocarcinoma. Patient is on chemotherapy. EXAM: CT CHEST, ABDOMEN, AND PELVIS WITH CONTRAST TECHNIQUE: Multidetector CT imaging of the chest, abdomen and pelvis was performed  following the standard protocol during bolus administration of intravenous contrast. CONTRAST:  175mL OMNIPAQUE IOHEXOL 300 MG/ML  SOLN COMPARISON:  CT abdomen pelvis dated 07/30/2019. FINDINGS: Evaluation is limited due to streak artifact caused by patient's arms. CT CHEST FINDINGS Cardiovascular: There is no cardiomegaly. There is a small pericardial effusion measuring approximately 5 mm in thickness. Mild atherosclerotic calcification of the aortic arch. No aneurysmal dilatation or dissection. The origins of the great vessels of the aortic arch appear patent as visualized. Left-sided PICC with tip in the upper SVC. The central pulmonary arteries appear unremarkable. Mediastinum/Nodes: No hilar or mediastinal adenopathy. The esophagus is grossly unremarkable. No mediastinal fluid collection. Lungs/Pleura: Moderate right and small left pleural effusions, significantly increased in size since the CT of 07/30/2019. There is associated partial compressive atelectasis of the lower lobes. Pneumonia is not excluded. Multiple bilateral pulmonary nodules measure up to 6 mm in the left lower lobe consistent with metastatic disease. There is no pneumothorax. The central airways are patent. Musculoskeletal: Indeterminate 2 cm lucent lesion in the right humeral head, possibly a bone cyst. Other etiologies are not excluded. No acute osseous pathology. CT ABDOMEN PELVIS FINDINGS Hepatobiliary: Multiple heterogeneously enhancing liver masses with the largest mass measuring approximately 12 x 10 cm (49/3). The largest mass demonstrates predominantly peripheral enhancement with areas of lower enhancement centrally, likely representing necrotic tissue. Several additional liver masses noted some of which are new and others have increased in size since the prior CT. There is probable background of fatty liver. No intrahepatic biliary ductal dilatation. No calcified gallstone. Pancreas: Unremarkable. No  pancreatic ductal dilatation or  surrounding inflammatory changes. Spleen: Normal in size without focal abnormality. Adrenals/Urinary Tract: The adrenal glands unremarkable. There is no hydronephrosis on either side. There is symmetric enhancement and excretion of contrast by both kidneys. Bilateral parapelvic cysts noted. The visualized ureters and urinary bladder are unremarkable. Stomach/Bowel: Diffuse thickened appearance of the colon, likely related to underdistention and ascites. Colitis is less likely but not excluded clinical correlation is recommended. There is no bowel obstruction. The appendix is normal. Vascular/Lymphatic: The abdominal aorta and IVC are unremarkable. No portal venous gas. Periportal adenopathy measures 13 mm in short axis (308/4). Multiple rounded and mildly enlarged top-normal retroperitoneal and para-aortic lymph nodes. Reproductive: The uterus is enlarged and myomatous. There is dilatation of the endometrial canal with irregularity of the endometrium which may represent proteinaceous or blood product or intracavitary fibroid. Other etiologies are not excluded. This can be better evaluated with MRI. Other: Interval development of small ascites, new since the prior CT. There is diffuse subcutaneous edema and anasarca. Musculoskeletal: Degenerative changes of the spine. No acute osseous pathology. IMPRESSION: 1. Overall progression of disease with increase in the size and number of the hepatic masses compared to the prior CT. 2. Multiple bilateral pulmonary nodules consistent with metastatic disease. 3. Interval development of small ascites and anasarca, new since the prior CT. 4. Bilateral pleural effusions, increased since the prior CT. 5. Diffuse thickened appearance of the colon likely related to underdistention and ascites. Colitis is less likely but not excluded. Clinical correlation is recommended. No bowel obstruction. Normal appendix. 6. Enlarged myomatous uterus. Thickened and irregular appearance of the  endometrial canal may represent proteinaceous or blood product or intracavitary fibroid. Other etiologies are not excluded. This can be better evaluated with MRI. 7. Aortic Atherosclerosis (ICD10-I70.0). Electronically Signed   By: Anner Crete M.D.   On: 01/15/2020 15:30   CT ABDOMEN PELVIS W CONTRAST  Result Date: 01/15/2020 CLINICAL DATA:  54 year old female with metastatic cholangiocarcinoma. Patient is on chemotherapy. EXAM: CT CHEST, ABDOMEN, AND PELVIS WITH CONTRAST TECHNIQUE: Multidetector CT imaging of the chest, abdomen and pelvis was performed following the standard protocol during bolus administration of intravenous contrast. CONTRAST:  156mL OMNIPAQUE IOHEXOL 300 MG/ML  SOLN COMPARISON:  CT abdomen pelvis dated 07/30/2019. FINDINGS: Evaluation is limited due to streak artifact caused by patient's arms. CT CHEST FINDINGS Cardiovascular: There is no cardiomegaly. There is a small pericardial effusion measuring approximately 5 mm in thickness. Mild atherosclerotic calcification of the aortic arch. No aneurysmal dilatation or dissection. The origins of the great vessels of the aortic arch appear patent as visualized. Left-sided PICC with tip in the upper SVC. The central pulmonary arteries appear unremarkable. Mediastinum/Nodes: No hilar or mediastinal adenopathy. The esophagus is grossly unremarkable. No mediastinal fluid collection. Lungs/Pleura: Moderate right and small left pleural effusions, significantly increased in size since the CT of 07/30/2019. There is associated partial compressive atelectasis of the lower lobes. Pneumonia is not excluded. Multiple bilateral pulmonary nodules measure up to 6 mm in the left lower lobe consistent with metastatic disease. There is no pneumothorax. The central airways are patent. Musculoskeletal: Indeterminate 2 cm lucent lesion in the right humeral head, possibly a bone cyst. Other etiologies are not excluded. No acute osseous pathology. CT ABDOMEN PELVIS  FINDINGS Hepatobiliary: Multiple heterogeneously enhancing liver masses with the largest mass measuring approximately 12 x 10 cm (49/3). The largest mass demonstrates predominantly peripheral enhancement with areas of lower enhancement centrally, likely representing necrotic tissue. Several additional liver masses  noted some of which are new and others have increased in size since the prior CT. There is probable background of fatty liver. No intrahepatic biliary ductal dilatation. No calcified gallstone. Pancreas: Unremarkable. No pancreatic ductal dilatation or surrounding inflammatory changes. Spleen: Normal in size without focal abnormality. Adrenals/Urinary Tract: The adrenal glands unremarkable. There is no hydronephrosis on either side. There is symmetric enhancement and excretion of contrast by both kidneys. Bilateral parapelvic cysts noted. The visualized ureters and urinary bladder are unremarkable. Stomach/Bowel: Diffuse thickened appearance of the colon, likely related to underdistention and ascites. Colitis is less likely but not excluded clinical correlation is recommended. There is no bowel obstruction. The appendix is normal. Vascular/Lymphatic: The abdominal aorta and IVC are unremarkable. No portal venous gas. Periportal adenopathy measures 13 mm in short axis (308/4). Multiple rounded and mildly enlarged top-normal retroperitoneal and para-aortic lymph nodes. Reproductive: The uterus is enlarged and myomatous. There is dilatation of the endometrial canal with irregularity of the endometrium which may represent proteinaceous or blood product or intracavitary fibroid. Other etiologies are not excluded. This can be better evaluated with MRI. Other: Interval development of small ascites, new since the prior CT. There is diffuse subcutaneous edema and anasarca. Musculoskeletal: Degenerative changes of the spine. No acute osseous pathology. IMPRESSION: 1. Overall progression of disease with increase in  the size and number of the hepatic masses compared to the prior CT. 2. Multiple bilateral pulmonary nodules consistent with metastatic disease. 3. Interval development of small ascites and anasarca, new since the prior CT. 4. Bilateral pleural effusions, increased since the prior CT. 5. Diffuse thickened appearance of the colon likely related to underdistention and ascites. Colitis is less likely but not excluded. Clinical correlation is recommended. No bowel obstruction. Normal appendix. 6. Enlarged myomatous uterus. Thickened and irregular appearance of the endometrial canal may represent proteinaceous or blood product or intracavitary fibroid. Other etiologies are not excluded. This can be better evaluated with MRI. 7. Aortic Atherosclerosis (ICD10-I70.0). Electronically Signed   By: Anner Crete M.D.   On: 01/15/2020 15:30    Medications: I have reviewed the patient's current medications.  Assessment/Plan:  #MetastaticIntrahepatic Cholangiocarcinoma, Stage IV 1) 07/30/2019: CT scan performed for generalized abdominal pain and bloating. Findings showed multiple hypervascular masses, abdominal lymphadenopathy, lung nodules, and thickening of the GE junction.  2) 08/04/2019: EGD showed candida infection of the esophagus, small hiatal hernia, with no other concerning findings.Colonoscopy performed with no malignancy or abnormalities noted. 3) 08/06/2019: establish care with Dr. Lorenso Courier 4) 08/17/2019: US guided biopsy of the liver lesion, confirmed adenocarcinoma without clearly denoting the origin. Pancreaticobiliary vs GYN origin.  5) 08/26/2019: underwent endometrial biopsy with Ob/Gyn. No evidence of GYN malignancy.  6) 10/04/2019: started Cycle 1 Day 1 Gem/cis with Dr. Jimmy Footman 7) 11/03/2019: Cycle 2 Day 1 of Gem/cis (delayed due to CVA)  8) 11/24/2019: Cycle 3 Day 1 of Gem/cis 9) 12/16/2019: Reconnected with [facility] Team after admission for bacteremia.  10)01/03/2020:  re-establish care with Dr. Lorenso Courier 11) CTs 01/15/2020-progressive metastatic disease involving the liver and lungs, increased pleural effusions, ascites 12) anemia/thrombocytopenia  #Right MCA infarct March 2021 #Admission with febrile neutropenia, sepsis April 2021 #Altered mental status  Ms. Castleman has metastatic cholangiocarcinoma.  She is admitted with failure to thrive and now appears obtunded.  Her clinical presentation is likely secondary to progression of the metastatic cholangiocarcinoma.  CTs obtained on 01/15/2020 confirmed disease progression in the lungs and liver.  She has anemia and thrombocytopenia, likely secondary to metastatic  disease involving the bone marrow.  I discussed the poor prognosis with her husband.  He acknowledges her clinical status has declined over recent weeks.  He understands she has metastatic carcinoma that cannot be cured.  I estimated her life span to be measured in months even if she were ambulatory and eating.  She is now obtunded with severe hypoalbuminemia.  I suspect the altered mental status is in part related to the recent CVA, delirium from critical illness, and potentially hepatic encephalopathy.  There is no evidence of an active infection at present.  I recommend hospice care and comfort measures.  Her husband states it would be difficult to care for her at home.  She appears to be a candidate for [employer].  Her husband is in agreement.  He understands her life span will most likely be measured in days to a week.  I will asked Dr. Lorenso Courier to see her tomorrow.  LOS: 5 days   Betsy Coder, MD   01/17/2020, 11:21 AM

## 2020-01-17 NOTE — Progress Notes (Signed)
PROGRESS NOTE    Melissa Hurley  XNA:[id number] DOB: 07/26/1966 DOA: 01/10/2020 PCP: Patient, No Pcp Per    Brief Narrative:  54 year old female with history of recently diagnosed metastatic cholangiocarcinoma on chemotherapy, recent bacteremia with Corynebacterium in the setting of Port-A-Cath infection that was removed and treated with vancomycin.  Left MCA CVA in March 2021 and currently on Eliquis.  She was following up at ID office on 5/26 and was found to be hypotensive and lethargic and sent to the ER.  Initial CT scan of the head suspected of hemorrhagic conversion of right MCA infarct so patient was admitted to neuro ICU.  5/26, admitted to neuro ICU from [facility] emergency room.CT head, 5/26 possible interval hemorrhage right MCA artery distribution infarct without mass-effect. MRI of the brain 5/26, expected interval evolution of subacute cortical and subcortical infarcts involving bilateral cerebral hemispheres. Cultures negative.  5/29, continues to be extremely weak, shaky.  Hematuria. 5/30, remains more lethargic, confused, altered mentation. 5/31, lethargic and encephalopathic.  Not eating.  Nearing end-of-life.  Comfort care started.  Assessment & Plan:   Active Problems:   Metastatic malignant neoplasm (HCC)   Intracerebral hemorrhage (HCC)   Protein-calorie malnutrition, severe   Terminal care   Palliative care by specialist   Acute metabolic encephalopathy   Cholangiocarcinoma ([place])   Anxiety state  Hypotension: From hypovolemia.  No evidence of sepsis.  Treated with maintenance IV fluids with improvement.  Cultures are negative.  PICC line to remain as she is potential chemotherapy candidate as requested by oncology.  Recent history of left MCA stroke with suspected hemorrhagic conversion: MRI negative for hemorrhagic conversion.  Eliquis resumed.  Seen by neurology and signed off.  Continue to work with PT OT. Now patient has frank hematuria, will hold off  Eliquis. Patient remains severely debilitated.  Metastatic cholangiocarcinoma/failure to thrive/severe physical debility/ now not eating/ confused and agitated: Status post 3 cycles of chemotherapy.  Followed by oncology.  Staging CT scans show more progression of disease. Given patient's advanced metastatic cholangiocarcinoma and failure to thrive with severe debility, patient is probably at her end-of-life.   Comfort care advised and is started.  Symptom control medications prescribed.  Visitor restrictions lifted so that other family can visit. Patient's husband wanted to talk to oncologist, discussed with covering oncologist to give his opinion.  Anemia, thrombocytopenia in the setting of malignancy: Nearing end-of-life.  Hypokalemia: Replaced aggressively with improvement.  Hypocalcemia: Nearing end-of-life.  Goal of care: Patient with advanced cancer and debility, probably at her end of life. Patient with worsening clinical status and now even not eating. She has been more anxious, agitated now. More encephalopathic. Appreciate palliative care team help. Discussed with on-call oncologist to give their opinion. Multiple meeting with patient's husband, now focusing more on comfort care. Probable transition to inpatient hospice, the way she is rapidly deteriorating, she may even die in the hospital.  DVT prophylaxis: SCD,  Code Status: Full code Family Communication: Patient's husband. Disposition Plan: Status is: Inpatient  Remains inpatient appropriate because: Unsafe disposition.  Encephalopathic.  Dispo: The patient is from: Home              Anticipated d/c is to: Home              Anticipated d/c date is: 2 days              Patient currently is not medically stable to d/c.  Will need hospice care.  Consultants:   Oncology  Neurology  PCCM  Procedures:   None  Antimicrobials:  Antibiotics Given (last 72 hours)    None         Subjective: Seen and  examined.  Lethargic sleepy all night.  Wakes up and gets agitated and frustrated.  Unable to have conversation.  Husband at bedside.  Met with husband along with palliative care provider. Objective: Vitals:   01/16/20 1945 01/16/20 2025 01/17/20 0420 01/17/20 1010  BP: (!) 111/55 (!) 145/77 (!) 121/109 134/80  Pulse: (!) 106 (!) 127 (!) 122 (!) 118  Resp: 17 18  (!) 22  Temp: 98.6 F (37 C) 97.6 F (36.4 C) 98 F (36.7 C) 98 F (36.7 C)  TempSrc: Oral Oral Oral   SpO2: 100% 100% 100% 100%  Weight:      Height:        Intake/Output Summary (Last 24 hours) at 01/17/2020 1127 Last data filed at 01/16/2020 1500 Gross per 24 hour  Intake 240 ml  Output --  Net 240 ml   Filed Weights   12/26/2019 2020  Weight: 93.8 kg    Examination:  General exam: Sick looking, extremely debilitated, delirious when she wakes up.  Otherwise sleepy and extremely lethargic. Respiratory system: Clear to auscultation. Respiratory effort normal.  Conducted airway sounds. Cardiovascular system: S1 & S2 heard, RRR. No JVD, murmurs, rubs, gallops or clicks.  2+ pedal edema with tenderness. Gastrointestinal system: Abdomen is nondistended, soft and nontender.  Central nervous system: Mostly sleepy and lethargic.  Extremities: Symmetric 5 x 5 power. Skin: No rashes, lesions or ulcers. Psychiatry: Judgement and insight appear normal. Mood & affect flat and anxious.    Data Reviewed: I have personally reviewed following labs and imaging studies  CBC: Recent Labs  Lab 01/15/2020 1348 01/02/2020 2222 01/13/20 0503 01/14/20 0507 01/16/20 0812  WBC 11.2*  --  10.2 10.9* 11.4*  NEUTROABS 7.9*  --   --   --  8.5*  HGB 6.9* 8.2* 8.1* 7.6* 7.3*  HCT 23.6* 27.3* 26.8* 25.1* 24.1*  MCV 87.7  --  86.2 86.9 86.4  PLT 138*  --  105* 95* 81*   Basic Metabolic Panel: Recent Labs  Lab 01/01/2020 1348 01/13/20 0503 01/13/20 1853 01/14/20 0507 01/14/20 2100 01/15/20 0500 01/15/20 0837 01/16/20 0812  NA 141    < > 141 139 140  --  142 142  K 3.3*   < > 4.0 3.0* 3.8  --  3.5 3.8  CL 110   < > 113* 115* 112*  --  116* 116*  CO2 22   < > 20* 18* 18*  --  17* 16*  GLUCOSE 101*   < > 85 108* 104*  --  99 87  BUN 16   < > 11 13 14   --  11 10  CREATININE 1.28*   < > 1.29* 1.18* 1.30*  --  1.20* 1.24*  CALCIUM 6.3*   < > 6.4* 6.4* 6.6*  --  6.5* 6.6*  MG 1.1*  --   --  2.1  --  1.6*  --  1.6*  PHOS  --   --  2.5 2.5  --  1.9*  --  2.2*   < > = values in this interval not displayed.   GFR: Estimated Creatinine Clearance: 62.4 mL/min (A) (by C-G formula based on SCr of 1.24 mg/dL (H)). Liver Function Tests: Recent Labs  Lab 12/19/2019 1348 01/13/20 0503 01/16/20 0812  AST 33 25 24  ALT 16  13 14  ALKPHOS 246* 215* 229*  BILITOT 1.3* 1.1 1.0  PROT 5.7* 5.1* 5.6*  ALBUMIN 1.9* 1.4* 1.5*   No results for input(s): LIPASE, AMYLASE in the last 168 hours. Recent Labs  Lab 12/30/2019 1348  AMMONIA 25   Coagulation Profile: Recent Labs  Lab 01/08/2020 1348 01/16/20 0812  INR 3.0* 2.5*   Cardiac Enzymes: No results for input(s): CKTOTAL, CKMB, CKMBINDEX, TROPONINI in the last 168 hours. BNP (last 3 results) No results for input(s): PROBNP in the last 8760 hours. HbA1C: No results for input(s): HGBA1C in the last 72 hours. CBG: No results for input(s): GLUCAP in the last 168 hours. Lipid Profile: No results for input(s): CHOL, HDL, LDLCALC, TRIG, CHOLHDL, LDLDIRECT in the last 72 hours. Thyroid Function Tests: No results for input(s): TSH, T4TOTAL, FREET4, T3FREE, THYROIDAB in the last 72 hours. Anemia Panel: No results for input(s): VITAMINB12, FOLATE, FERRITIN, TIBC, IRON, RETICCTPCT in the last 72 hours. Sepsis Labs: Recent Labs  Lab 01/06/2020 1348 12/26/2019 1735  LATICACIDVEN 3.0* 3.5*    Recent Results (from the past 240 hour(s))  Urine culture     Status: None   Collection Time: 12/21/2019 12:56 PM   Specimen: In/Out Cath Urine  Result Value Ref Range Status   Specimen  Description   Final    IN/OUT CATH URINE Performed at [facility], [place] [address], [place], [place] [address]    Special Requests   Final    NONE Performed at [facility], [place] [address], [place], [place] [address]    Culture   Final    NO GROWTH Performed at [facility], [place] [address] [place], [place] [address]    Report Status 01/14/2020 FINAL  Final  SARS Coronavirus 2 by RT PCR (hospital order, performed in [facility] hospital lab) Nasopharyngeal Nasopharyngeal Swab     Status: None   Collection Time: 01/09/2020  1:48 PM   Specimen: Nasopharyngeal Swab  Result Value Ref Range Status   SARS Coronavirus 2 NEGATIVE NEGATIVE Final    Comment: (NOTE) SARS-CoV-2 target nucleic acids are NOT DETECTED. The SARS-CoV-2 RNA is generally detectable in upper and lower respiratory specimens during the acute phase of infection. The lowest concentration of SARS-CoV-2 viral copies this assay can detect is 250 copies / mL. A negative result does not preclude SARS-CoV-2 infection and should not be used as the sole basis for treatment or other patient management decisions.  A negative result may occur with improper specimen collection / handling, submission of specimen other than nasopharyngeal swab, presence of viral mutation(s) within the areas targeted by this assay, and inadequate number of viral copies (<250 copies / mL). A negative result must be combined with clinical observations, patient history, and epidemiological information. Fact Sheet for Patients:   StrictlyIdeas.no Fact Sheet for Healthcare Providers: BankingDealers.co.za This test is not yet approved or cleared  by the [place] FDA and has been authorized for detection and/or diagnosis of SARS-CoV-2 by FDA under an Emergency Use Authorization (EUA).  This EUA will remain in effect (meaning this test can be used) for the duration  of the COVID-19 declaration under Section 564(b)(1) of the Act, 21 U.S.C. section 360bbb-3(b)(1), unless the authorization is terminated or revoked sooner. Performed at [facility], [place] [address] [place], [place] [address]   Blood Culture (routine x 2)     Status: None (Preliminary result)   Collection Time: 12/27/2019  2:00 PM   Specimen:  BLOOD  Result Value Ref Range Status   Specimen Description BLOOD SITE NOT SPECIFIED  Final   Special Requests   Final    BOTTLES DRAWN AEROBIC AND ANAEROBIC Blood Culture adequate volume Performed at [place] [address] [place], [place] [address]    Culture   Final    NO GROWTH 4 DAYS Performed at [facility], [place] [address], [place], [place] [address]    Report Status PENDING  Incomplete  MRSA PCR Screening     Status: None   Collection Time: 01/07/2020  8:40 PM   Specimen: Nasal Mucosa; Nasopharyngeal  Result Value Ref Range Status   MRSA by PCR NEGATIVE NEGATIVE Final    Comment:        The GeneXpert MRSA Assay (FDA approved for NASAL specimens only), is one component of a comprehensive MRSA colonization surveillance program. It is not intended to diagnose MRSA infection nor to guide or monitor treatment for MRSA infections. Performed at [facility], [place] [address], [place],  [address]          Radiology Studies: CT CHEST W CONTRAST  Result Date: 01/15/2020 CLINICAL DATA:  54 year old female with metastatic cholangiocarcinoma. Patient is on chemotherapy. EXAM: CT CHEST, ABDOMEN, AND PELVIS WITH CONTRAST TECHNIQUE: Multidetector CT imaging of the chest, abdomen and pelvis was performed following the standard protocol during bolus administration of intravenous contrast. CONTRAST:  125m OMNIPAQUE IOHEXOL 300 MG/ML  SOLN COMPARISON:  CT abdomen pelvis dated 07/30/2019. FINDINGS: Evaluation is limited due to streak artifact caused by patient's arms. CT CHEST FINDINGS  Cardiovascular: There is no cardiomegaly. There is a small pericardial effusion measuring approximately 5 mm in thickness. Mild atherosclerotic calcification of the aortic arch. No aneurysmal dilatation or dissection. The origins of the great vessels of the aortic arch appear patent as visualized. Left-sided PICC with tip in the upper SVC. The central pulmonary arteries appear unremarkable. Mediastinum/Nodes: No hilar or mediastinal adenopathy. The esophagus is grossly unremarkable. No mediastinal fluid collection. Lungs/Pleura: Moderate right and small left pleural effusions, significantly increased in size since the CT of 07/30/2019. There is associated partial compressive atelectasis of the lower lobes. Pneumonia is not excluded. Multiple bilateral pulmonary nodules measure up to 6 mm in the left lower lobe consistent with metastatic disease. There is no pneumothorax. The central airways are patent. Musculoskeletal: Indeterminate 2 cm lucent lesion in the right humeral head, possibly a bone cyst. Other etiologies are not excluded. No acute osseous pathology. CT ABDOMEN PELVIS FINDINGS Hepatobiliary: Multiple heterogeneously enhancing liver masses with the largest mass measuring approximately 12 x 10 cm (49/3). The largest mass demonstrates predominantly peripheral enhancement with areas of lower enhancement centrally, likely representing necrotic tissue. Several additional liver masses noted some of which are new and others have increased in size since the prior CT. There is probable background of fatty liver. No intrahepatic biliary ductal dilatation. No calcified gallstone. Pancreas: Unremarkable. No pancreatic ductal dilatation or surrounding inflammatory changes. Spleen: Normal in size without focal abnormality. Adrenals/Urinary Tract: The adrenal glands unremarkable. There is no hydronephrosis on either side. There is symmetric enhancement and excretion of contrast by both kidneys. Bilateral parapelvic cysts  noted. The visualized ureters and urinary bladder are unremarkable. Stomach/Bowel: Diffuse thickened appearance of the colon, likely related to underdistention and ascites. Colitis is less likely but not excluded clinical correlation is recommended. There is no bowel obstruction. The appendix is normal. Vascular/Lymphatic: The abdominal aorta and IVC are unremarkable. No portal  venous gas. Periportal adenopathy measures 13 mm in short axis (308/4). Multiple rounded and mildly enlarged top-normal retroperitoneal and para-aortic lymph nodes. Reproductive: The uterus is enlarged and myomatous. There is dilatation of the endometrial canal with irregularity of the endometrium which may represent proteinaceous or blood product or intracavitary fibroid. Other etiologies are not excluded. This can be better evaluated with MRI. Other: Interval development of small ascites, new since the prior CT. There is diffuse subcutaneous edema and anasarca. Musculoskeletal: Degenerative changes of the spine. No acute osseous pathology. IMPRESSION: 1. Overall progression of disease with increase in the size and number of the hepatic masses compared to the prior CT. 2. Multiple bilateral pulmonary nodules consistent with metastatic disease. 3. Interval development of small ascites and anasarca, new since the prior CT. 4. Bilateral pleural effusions, increased since the prior CT. 5. Diffuse thickened appearance of the colon likely related to underdistention and ascites. Colitis is less likely but not excluded. Clinical correlation is recommended. No bowel obstruction. Normal appendix. 6. Enlarged myomatous uterus. Thickened and irregular appearance of the endometrial canal may represent proteinaceous or blood product or intracavitary fibroid. Other etiologies are not excluded. This can be better evaluated with MRI. 7. Aortic Atherosclerosis (ICD10-I70.0). Electronically Signed   By: Anner Crete M.D.   On: 01/15/2020 15:30   CT  ABDOMEN PELVIS W CONTRAST  Result Date: 01/15/2020 CLINICAL DATA:  54 year old female with metastatic cholangiocarcinoma. Patient is on chemotherapy. EXAM: CT CHEST, ABDOMEN, AND PELVIS WITH CONTRAST TECHNIQUE: Multidetector CT imaging of the chest, abdomen and pelvis was performed following the standard protocol during bolus administration of intravenous contrast. CONTRAST:  156m OMNIPAQUE IOHEXOL 300 MG/ML  SOLN COMPARISON:  CT abdomen pelvis dated 07/30/2019. FINDINGS: Evaluation is limited due to streak artifact caused by patient's arms. CT CHEST FINDINGS Cardiovascular: There is no cardiomegaly. There is a small pericardial effusion measuring approximately 5 mm in thickness. Mild atherosclerotic calcification of the aortic arch. No aneurysmal dilatation or dissection. The origins of the great vessels of the aortic arch appear patent as visualized. Left-sided PICC with tip in the upper SVC. The central pulmonary arteries appear unremarkable. Mediastinum/Nodes: No hilar or mediastinal adenopathy. The esophagus is grossly unremarkable. No mediastinal fluid collection. Lungs/Pleura: Moderate right and small left pleural effusions, significantly increased in size since the CT of 07/30/2019. There is associated partial compressive atelectasis of the lower lobes. Pneumonia is not excluded. Multiple bilateral pulmonary nodules measure up to 6 mm in the left lower lobe consistent with metastatic disease. There is no pneumothorax. The central airways are patent. Musculoskeletal: Indeterminate 2 cm lucent lesion in the right humeral head, possibly a bone cyst. Other etiologies are not excluded. No acute osseous pathology. CT ABDOMEN PELVIS FINDINGS Hepatobiliary: Multiple heterogeneously enhancing liver masses with the largest mass measuring approximately 12 x 10 cm (49/3). The largest mass demonstrates predominantly peripheral enhancement with areas of lower enhancement centrally, likely representing necrotic tissue.  Several additional liver masses noted some of which are new and others have increased in size since the prior CT. There is probable background of fatty liver. No intrahepatic biliary ductal dilatation. No calcified gallstone. Pancreas: Unremarkable. No pancreatic ductal dilatation or surrounding inflammatory changes. Spleen: Normal in size without focal abnormality. Adrenals/Urinary Tract: The adrenal glands unremarkable. There is no hydronephrosis on either side. There is symmetric enhancement and excretion of contrast by both kidneys. Bilateral parapelvic cysts noted. The visualized ureters and urinary bladder are unremarkable. Stomach/Bowel: Diffuse thickened appearance of the colon, likely related to  underdistention and ascites. Colitis is less likely but not excluded clinical correlation is recommended. There is no bowel obstruction. The appendix is normal. Vascular/Lymphatic: The abdominal aorta and IVC are unremarkable. No portal venous gas. Periportal adenopathy measures 13 mm in short axis (308/4). Multiple rounded and mildly enlarged top-normal retroperitoneal and para-aortic lymph nodes. Reproductive: The uterus is enlarged and myomatous. There is dilatation of the endometrial canal with irregularity of the endometrium which may represent proteinaceous or blood product or intracavitary fibroid. Other etiologies are not excluded. This can be better evaluated with MRI. Other: Interval development of small ascites, new since the prior CT. There is diffuse subcutaneous edema and anasarca. Musculoskeletal: Degenerative changes of the spine. No acute osseous pathology. IMPRESSION: 1. Overall progression of disease with increase in the size and number of the hepatic masses compared to the prior CT. 2. Multiple bilateral pulmonary nodules consistent with metastatic disease. 3. Interval development of small ascites and anasarca, new since the prior CT. 4. Bilateral pleural effusions, increased since the prior CT.  5. Diffuse thickened appearance of the colon likely related to underdistention and ascites. Colitis is less likely but not excluded. Clinical correlation is recommended. No bowel obstruction. Normal appendix. 6. Enlarged myomatous uterus. Thickened and irregular appearance of the endometrial canal may represent proteinaceous or blood product or intracavitary fibroid. Other etiologies are not excluded. This can be better evaluated with MRI. 7. Aortic Atherosclerosis (ICD10-I70.0). Electronically Signed   By: Anner Crete M.D.   On: 01/15/2020 15:30        Scheduled Meds:  metoCLOPramide (REGLAN) injection  5 mg Intravenous Q8H   mirtazapine  7.5 mg Oral QHS   senna-docusate  1 tablet Oral BID   Continuous Infusions:  dextrose 5 % and 0.9% NaCl 75 mL/hr at 01/16/20 1843     LOS: 5 days    Time spent: 30 minutes     Barb Merino, MD [facility] Pager [phone]

## 2020-01-17 NOTE — Progress Notes (Signed)
PMT provider symptom check. Patient appears comfortable this afternoon following prn doses of morphine and ativan. Husband not currently at bedside. Hospice referral has been placed.   NO CHARGE  Ihor Dow, [place], FNP-C Palliative Medicine Team  Phone: [phone] Fax: [phone]

## 2020-01-17 NOTE — TOC Initial Note (Signed)
Transition of Care [facility]) - Initial/Assessment Note    Patient Details  Name: Melissa Hurley MRN: [id number] Date of Birth: [birth year]  Transition of Care [facility]) CM/SW Contact:    Geralynn Ochs, LCSW Phone Number: 01/17/2020, 12:59 PM  Clinical Narrative:     CSW acknowledging consult for [facility]. CSW sent referral to [facility] with Gaffer for [employer]. Jen to follow up on eligibility, no beds available today. CSW to follow.              Expected Discharge Plan: [place] Barriers to Discharge: Hospice Bed not available   Patient Goals and CMS Choice Patient states their goals for this hospitalization and ongoing recovery are:: patient unable to participate in goal setting due to disorientation CMS Medicare.gov Compare Post Acute Care list provided to:: Patient Represenative (must comment) Choice offered to / list presented to : Spouse  Expected Discharge Plan and Services Expected Discharge Plan: [place]     Post Acute Care Choice: Hospice Living arrangements for the past 2 months: Single Family Home                                      Prior Living Arrangements/Services Living arrangements for the past 2 months: Single Family Home Lives with:: Spouse Patient language and need for interpreter reviewed:: No Do you feel safe going back to the place where you live?: Yes      Need for Family Participation in Patient Care: Yes (Comment) Care giver support system in place?: No (comment)   Criminal Activity/Legal Involvement Pertinent to Current Situation/Hospitalization: No - Comment as needed  Activities of Daily Living Home Assistive Devices/Equipment: Contact lenses ADL Screening (condition at time of admission) Patient's cognitive ability adequate to safely complete daily activities?: Yes Is the patient deaf or have difficulty hearing?: No Does the patient have difficulty seeing, even when wearing glasses/contacts?:  No Does the patient have difficulty concentrating, remembering, or making decisions?: No Patient able to express need for assistance with ADLs?: Yes Does the patient have difficulty dressing or bathing?: Yes Independently performs ADLs?: No Communication: Independent Dressing (OT): Needs assistance Is this a change from baseline?: Pre-admission baseline Grooming: Needs assistance Is this a change from baseline?: Pre-admission baseline Feeding: Independent Bathing: Needs assistance Is this a change from baseline?: Pre-admission baseline Toileting: Needs assistance Is this a change from baseline?: Pre-admission baseline In/Out Bed: Needs assistance Is this a change from baseline?: Pre-admission baseline Walks in Home: Needs assistance Is this a change from baseline?: Pre-admission baseline Does the patient have difficulty walking or climbing stairs?: Yes Weakness of Legs: Both Weakness of Arms/Hands: Both  Permission Sought/Granted Permission sought to share information with : Facility Sport and exercise psychologist, Family Supports Permission granted to share information with : Yes, Verbal Permission Granted  Share Information with NAME: Chrissie Noa  Permission granted to share info w AGENCY: Event organiser granted to share info w Relationship: Husband     Emotional Assessment   Attitude/Demeanor/Rapport: Unable to Assess Affect (typically observed): Unable to Assess Orientation: : Oriented to Self Alcohol / Substance Use: Not Applicable Psych Involvement: No (comment)  Admission diagnosis:  Intracerebral hemorrhage ([place]) [I61.9] Intracranial hemorrhage ([place]) [I62.9] Chronic anticoagulation [Z79.01] Anemia, unspecified type [D64.9] Metastatic malignant neoplasm, unspecified site [facility]) [C79.9] Patient Active Problem List   Diagnosis Date Noted  . Acute metabolic encephalopathy 99991111  . Cholangiocarcinoma ([place])   .  Anxiety state   . Protein-calorie malnutrition, severe  01/15/2020  . Terminal care   . Palliative care by specialist   . Bloodstream infection due to Port-A-Cath 12/23/2019  . Intracerebral hemorrhage ([place]) 01/10/2020  . PICC (peripherally inserted central catheter) in place 01/03/2020  . Malnutrition of moderate degree 12/18/2019  . Bacteremia   . Pericardial effusion   . Pancytopenia ([place]) 12/10/2019  . Nausea & vomiting 12/10/2019  . Dehydration 12/10/2019  . AKI (acute kidney injury) ([place]) 12/10/2019  . SIRS (systemic inflammatory response syndrome) ([place]) 12/10/2019  . Metastatic malignant neoplasm ([place]) 12/10/2019  . History of CVA (cerebrovascular accident) 12/10/2019  . Hypokalemia 12/10/2019  . Hypocalcemia 12/10/2019  . Acute CVA (cerebrovascular accident) ([place]) 10/25/2019  . Anemia 10/25/2019  . Fibroid uterus 08/27/2019   PCP:  Patient, No Pcp Per Pharmacy:   [facility] [address] [place] [address] [place] [place] [address] Phone: [phone] Fax: [phone]     Social Determinants of Health (SDOH) Interventions    Readmission Risk Interventions No flowsheet data found.

## 2020-01-17 NOTE — Progress Notes (Addendum)
Occupational Therapy Treatment and Discharge Note (late entry)  Pt with bed alarm alarming, pt sitting EOB restless and confused.  Assisted pt to [facility] with mod A +2 with use of stedy.  Pt confused today.  Follows commands with increased time and cuing.  She requires total A for peri care.  She is significantly weaker than she was 2 days ago at time of eval.  Note GOC ongoing.  Addendum:  Noted palliative care notes and MD notes with likely transition to Hospice care services.  Due to rapid decline and likely transfer to either Hospice facility or possible hospital death, OT services no longer indicated and OT will sign off at this time.     01/16/20 2030  OT Visit Information  Last OT Received On 01/16/20  Assistance Needed +1  History of Present Illness 54 year old female with history of metastatic cholangiocarcinoma on chemotherapy, recent bacteremia in the setting of Port-A-Cath infection.  Left MCA CVA in March 2021 and currently on Eliquis.  She was following up at ID office on 5/26 and was found to be hypotensive and lethargic. Initial CT showing suspected hemorrhagic conversion of L MCA infarct. MRI showing expected interval evolution of subacute cortical and subcortical infarcts involving bilateral cerebral hemispheres.  Precautions  Precautions Fall  Precaution Comments pt reports sliding off edge of bed  Pain Assessment  Pain Assessment Faces  Faces Pain Scale 4  Pain Location generalized discomfort  Pain Descriptors / Indicators Grimacing  Pain Intervention(s) Monitored during session;Repositioned  Cognition  Arousal/Alertness Lethargic;Awake/alert  Behavior During Therapy Impulsive;Restless  Overall Cognitive Status Impaired/Different from baseline  Area of Impairment Attention;Following commands;Safety/judgement;Problem solving  Current Attention Level Sustained  Following Commands Follows one step commands with increased time  Safety/Judgement Decreased awareness of  deficits;Decreased awareness of safety  Problem Solving Slow processing;Decreased initiation;Difficulty sequencing;Requires verbal cues;Requires tactile cues  General Comments Pt often mumbling incoherrently.  She is confused, requires cues and increased time to follow simple commands   Upper Extremity Assessment  Upper Extremity Assessment Generalized weakness;RUE deficits/detail;LUE deficits/detail  RUE Deficits / Details bil UEs tremulous  RUE Coordination decreased fine motor;decreased gross motor  LUE Deficits / Details bil UEs tremulous  LUE Coordination decreased fine motor;decreased gross motor  Lower Extremity Assessment  Lower Extremity Assessment Generalized weakness  ADL  Toilet Transfer Moderate assistance;+2 for physical assistance;+2 for safety/equipment;Cueing for safety;Cueing for sequencing;Stand-pivot;BSC  Toilet Transfer Details (indicate cue type and reason) required use of stedy with assist to boost hips, and maintain standing before sitting on stedy   Toileting- Clothing Manipulation and Hygiene Total assistance;Sit to/from stand  Functional mobility during ADLs Moderate assistance;+2 for physical assistance;+2 for safety/equipment (stedy)  Bed Mobility  Overal bed mobility Needs Assistance  Bed Mobility Sit to Supine  Sit to supine Max assist  General bed mobility comments Pt sitting EOB upon therapist's arrival. Required assist to lower trunk and lift legs when returning to supine   Balance  Overall balance assessment Needs assistance  Sitting-balance support Single extremity supported;Bilateral upper extremity supported;Feet supported  Sitting balance-Leahy Scale Fair  Sitting balance - Comments pt able to maintain static sitting EOB with min guard assist, but as she fatigued, began to lean to the left   Standing balance support Bilateral upper extremity supported  Standing balance-Leahy Scale Poor  Standing balance comment requires mod A and bil. UEs    Restrictions  Weight Bearing Restrictions No  Transfers  Overall transfer level Needs assistance  Equipment used Ambulation equipment used  Transfer via [place] to/from [employer] Transfers  Sit to [employer] safety/equipment;+2 physical assistance  Stand pivot transfers Mod assist;+2 physical assistance;+2 safety/equipment  General transfer comment Pt required mod A +2 to move sit to stand and to steady self   General Comments  General comments (skin integrity, edema, etc.) bed alarm was alarming.  Upon entering room, pt sitting EOB, restless needing to use BSC, and husband preparing to assist her.  Used the stedy to assist her to [facility].  Nsg assisted OT with remainder of treatment session   OT - End of Session  Equipment Utilized During Treatment Gait belt  Activity Tolerance Patient limited by fatigue  Patient left in bed;with family/visitor present;with nursing/sitter in room  Nurse Communication Mobility status;Need for lift equipment  OT Assessment/Plan  OT Plan Discharge plan needs to be updated  OT Visit Diagnosis Unsteadiness on feet (R26.81);Muscle weakness (generalized) (M62.81);Cognitive communication deficit (R41.841)  OT Frequency (ACUTE ONLY) Min 2X/week  Follow Up Recommendations Other (comment) (uncertain at this time as [place] in progress )  OT Equipment Other (comment) (uncertain at this time as [place] in progress )  AM-PAC OT "6 Clicks" Daily Activity Outcome Measure (Version 2)  Help from another person eating meals? 2  Help from another person taking care of personal grooming? 2  Help from another person toileting, which includes using toliet, bedpan, or urinal? 2  Help from another person bathing (including washing, rinsing, drying)? 2  Help from another person to put on and taking off regular upper body clothing? 1  Help from another person to put on and taking off regular lower body clothing? 1  6 Click Score 10  OT Goal  Progression  Progress towards OT goals Not progressing toward goals - comment (confusion and lethargy )  OT Time Calculation  OT Start Time (ACUTE ONLY) 1252  OT Stop Time (ACUTE ONLY) 1313  OT Time Calculation (min) 21 min  OT General Charges  $OT Visit 1 Visit  OT Treatments  $Self Care/Home Management  8-22 mins  Nilsa Nutting., OTR/L Acute Rehabilitation Services Pager [phone] Office [phone]

## 2020-01-17 NOTE — Progress Notes (Signed)
Ship broker received referral for pt to transfer to [employer] for EOL care. Pt's chart currently in review by hospice staff to determine eligibility for [facility].     Writer spoke with pt's husband to confirm interest. Answered all questions and explained hospice philosophy. Explained that [place] will inform him if pt is approved, as well as hospital staff.     Please do not hesitate to outreach with any questions.     Freddie Breech, RN  [facility] Liaison  [phone]

## 2020-01-18 DIAGNOSIS — E43 Unspecified severe protein-calorie malnutrition: Secondary | ICD-10-CM

## 2020-01-18 LAB — CULTURE, BLOOD (ROUTINE X 2)
Culture: NO GROWTH
Special Requests: ADEQUATE

## 2020-01-18 NOTE — Progress Notes (Signed)
PROGRESS NOTE    Melissa Hurley  [id number] DOB: [birth year] DOA: 12/27/2019 PCP: Patient, No Pcp Per    Brief Narrative:  54 year old female with history of recently diagnosed metastatic cholangiocarcinoma on chemotherapy, recent bacteremia with Corynebacterium in the setting of Port-A-Cath infection that was removed and treated with vancomycin.  Left MCA CVA in March 2021 and currently on Eliquis.  She was following up at ID office on 5/26 and was found to be hypotensive and lethargic and sent to the ER.  Initial CT scan of the head suspected of hemorrhagic conversion of right MCA infarct so patient was admitted to neuro ICU.  5/26, admitted to neuro ICU from [facility] emergency room.CT head, 5/26 possible interval hemorrhage right MCA artery distribution infarct without mass-effect. MRI of the brain 5/26, expected interval evolution of subacute cortical and subcortical infarcts involving bilateral cerebral hemispheres. Cultures negative.  5/29, continues to be extremely weak, shaky.  Hematuria. 5/30, remains more lethargic, confused, altered mentation. 5/31, lethargic and encephalopathic.  Not eating.  Nearing end-of-life.  Comfort care started. 6/1, remains with episodic gasping of breath, obtunded.  Assessment & Plan:   Active Problems:   Metastatic malignant neoplasm (HCC)   Intracerebral hemorrhage (HCC)   Protein-calorie malnutrition, severe   Terminal care   Palliative care by specialist   Acute metabolic encephalopathy   Cholangiocarcinoma ([place])   Anxiety state  Metastatic cholangiocarcinoma with metastasis to liver and lungs  Failure to thrive  Severe physical debility  Severe metabolic encephalopathy with liver failure, anemia and thrombocytopenia  End-of-life care   Plan : Patient with advanced cancer and debility , now with encephalopathy at end-of-life. Started on comfort care and hospice level of care.  All symptom control medications available.  As needed  morphine and Ativan.  Will reevaluate for need for morphine infusion. Appreciate palliative care involvement and help.  Patient is rapidly deteriorating, will explore inpatient hospice options.  Today, will provide end-of-life care in the hospital.  She is too unstable to transfer to hospice today.  She may pass away in the hospital. Liberalize visitor policy.   DVT prophylaxis: Comfort care Code Status: Comfort care Family Communication: Patient's husband. Disposition Plan: Status is: Inpatient  Remains inpatient appropriate because: Critical.  End-of-life.  Dispo: The patient is from: Home              Anticipated d/c is to: Unknown.  Inpatient hospice, she may die in the hospital.              Anticipated d/c date is: Unknown.              Patient currently is not medically stable to d/c.  Will need hospice care.  Consultants:   Oncology  Neurology  PCCM  Procedures:   None  Antimicrobials:  Antibiotics Given (last 72 hours)    None         Subjective: Seen and examined.  Persistent encephalopathy.  Obtunded.  Unable to have conversation.  Episodic spasms noted. Objective: Vitals:   01/17/20 1010 01/17/20 1937 01/18/20 0800 01/18/20 0811  BP: 134/80 97/63  132/78  Pulse: (!) 118 (!) 112  (!) 130  Resp: (!) 22 20  20   Temp: 98 F (36.7 C) 98.3 F (36.8 C)  98.3 F (36.8 C)  TempSrc:  Axillary  Oral  SpO2: 100% 100% 100% 100%  Weight:      Height:        Intake/Output Summary (Last 24 hours) at 01/18/2020  [place] filed at 01/18/2020 0400 Gross per 24 hour  Intake 1210.27 ml  Output 200 ml  Net 1010.27 ml   Filed Weights   01/07/2020 2020  Weight: 93.8 kg    Examination: Physical Exam  Constitutional:  Sick looking.  Encephalopathy.  Obtunded and not able to follow commands.  HENT:  Head: Atraumatic.  Eyes: Pupils are equal, round, and reactive to light.  Cardiovascular: Regular rhythm.  Pulmonary/Chest: Breath sounds normal.  Abdominal:  Bowel sounds are normal.      Data Reviewed: I have personally reviewed following labs and imaging studies  CBC: Recent Labs  Lab 12/29/2019 1348 12/27/2019 2222 01/13/20 0503 01/14/20 0507 01/16/20 0812  WBC 11.2*  --  10.2 10.9* 11.4*  NEUTROABS 7.9*  --   --   --  8.5*  HGB 6.9* 8.2* 8.1* 7.6* 7.3*  HCT 23.6* 27.3* 26.8* 25.1* 24.1*  MCV 87.7  --  86.2 86.9 86.4  PLT 138*  --  105* 95* 81*   Basic Metabolic Panel: Recent Labs  Lab 12/27/2019 1348 01/13/20 0503 01/13/20 1853 01/14/20 0507 01/14/20 2100 01/15/20 0500 01/15/20 0837 01/16/20 0812  NA 141   < > 141 139 140  --  142 142  K 3.3*   < > 4.0 3.0* 3.8  --  3.5 3.8  CL 110   < > 113* 115* 112*  --  116* 116*  CO2 22   < > 20* 18* 18*  --  17* 16*  GLUCOSE 101*   < > 85 108* 104*  --  99 87  BUN 16   < > 11 13 14   --  11 10  CREATININE 1.28*   < > 1.29* 1.18* 1.30*  --  1.20* 1.24*  CALCIUM 6.3*   < > 6.4* 6.4* 6.6*  --  6.5* 6.6*  MG 1.1*  --   --  2.1  --  1.6*  --  1.6*  PHOS  --   --  2.5 2.5  --  1.9*  --  2.2*   < > = values in this interval not displayed.   GFR: Estimated Creatinine Clearance: 62.4 mL/min (A) (by C-G formula based on SCr of 1.24 mg/dL (H)). Liver Function Tests: Recent Labs  Lab 12/28/2019 1348 01/13/20 0503 01/16/20 0812  AST 33 25 24  ALT 16 13 14   ALKPHOS 246* 215* 229*  BILITOT 1.3* 1.1 1.0  PROT 5.7* 5.1* 5.6*  ALBUMIN 1.9* 1.4* 1.5*   No results for input(s): LIPASE, AMYLASE in the last 168 hours. Recent Labs  Lab 01/13/2020 1348  AMMONIA 25   Coagulation Profile: Recent Labs  Lab 01/13/2020 1348 01/16/20 0812  INR 3.0* 2.5*   Cardiac Enzymes: No results for input(s): CKTOTAL, CKMB, CKMBINDEX, TROPONINI in the last 168 hours. BNP (last 3 results) No results for input(s): PROBNP in the last 8760 hours. HbA1C: No results for input(s): HGBA1C in the last 72 hours. CBG: No results for input(s): GLUCAP in the last 168 hours. Lipid Profile: No results for input(s):  CHOL, HDL, LDLCALC, TRIG, CHOLHDL, LDLDIRECT in the last 72 hours. Thyroid Function Tests: No results for input(s): TSH, T4TOTAL, FREET4, T3FREE, THYROIDAB in the last 72 hours. Anemia Panel: No results for input(s): VITAMINB12, FOLATE, FERRITIN, TIBC, IRON, RETICCTPCT in the last 72 hours. Sepsis Labs: Recent Labs  Lab 12/18/2019 1348 01/04/2020 1735  LATICACIDVEN 3.0* 3.5*    Recent Results (from the past 240 hour(s))  Urine culture  Status: None   Collection Time: 01/08/2020 12:56 PM   Specimen: In/Out Cath Urine  Result Value Ref Range Status   Specimen Description   Final    IN/OUT CATH URINE Performed at [facility], [place] [address] [place], [place] [address]    Special Requests   Final    NONE Performed at [facility], [place] [address] [place], [place] [address]    Culture   Final    NO GROWTH Performed at [facility], [place] [address], [place], [place] [address]    Report Status 01/14/2020 FINAL  Final  SARS Coronavirus 2 by RT PCR (hospital order, performed in [facility] hospital lab) Nasopharyngeal Nasopharyngeal Swab     Status: None   Collection Time: 12/21/2019  1:48 PM   Specimen: Nasopharyngeal Swab  Result Value Ref Range Status   SARS Coronavirus 2 NEGATIVE NEGATIVE Final    Comment: (NOTE) SARS-CoV-2 target nucleic acids are NOT DETECTED. The SARS-CoV-2 RNA is generally detectable in upper and lower respiratory specimens during the acute phase of infection. The lowest concentration of SARS-CoV-2 viral copies this assay can detect is 250 copies / mL. A negative result does not preclude SARS-CoV-2 infection and should not be used as the sole basis for treatment or other patient management decisions.  A negative result may occur with improper specimen collection / handling, submission of specimen other than nasopharyngeal swab, presence of viral mutation(s) within the areas targeted by this assay, and inadequate  number of viral copies (<250 copies / mL). A negative result must be combined with clinical observations, patient history, and epidemiological information. Fact Sheet for Patients:   StrictlyIdeas.no Fact Sheet for Healthcare Providers: BankingDealers.co.za This test is not yet approved or cleared  by the [place] FDA and has been authorized for detection and/or diagnosis of SARS-CoV-2 by FDA under an Emergency Use Authorization (EUA).  This EUA will remain in effect (meaning this test can be used) for the duration of the COVID-19 declaration under Section 564(b)(1) of the Act, 21 U.S.C. section 360bbb-3(b)(1), unless the authorization is terminated or revoked sooner. Performed at [facility], [place] [address], [place], [place] [address]   Blood Culture (routine x 2)     Status: None   Collection Time: 12/18/2019  2:00 PM   Specimen: BLOOD  Result Value Ref Range Status   Specimen Description BLOOD SITE NOT SPECIFIED  Final   Special Requests   Final    BOTTLES DRAWN AEROBIC AND ANAEROBIC Blood Culture adequate volume Performed at [place] [address] [place], [place] [address]    Culture   Final    NO GROWTH 5 DAYS Performed at [facility], [place] [address], [place], [place] [address]    Report Status 01/18/2020 FINAL  Final  MRSA PCR Screening     Status: None   Collection Time: 01/07/2020  8:40 PM   Specimen: Nasal Mucosa; Nasopharyngeal  Result Value Ref Range Status   MRSA by PCR NEGATIVE NEGATIVE Final    Comment:        The GeneXpert MRSA Assay (FDA approved for NASAL specimens only), is one component of a comprehensive MRSA colonization surveillance program. It is not intended to diagnose MRSA infection nor to guide or monitor treatment for MRSA infections. Performed at [facility], [place] [address] [place], [place] [address]          Radiology  Studies: No results  found.      Scheduled Meds: . metoCLOPramide (REGLAN) injection  5 mg Intravenous Q8H  . mirtazapine  7.5 mg Oral QHS  . senna-docusate  1 tablet Oral BID   Continuous Infusions: . dextrose 5 % and 0.9% NaCl 75 mL/hr at 01/18/20 0835     LOS: 6 days    Time spent: 30 minutes     Barb Merino, MD [facility] Pager [phone]

## 2020-01-18 NOTE — Progress Notes (Signed)
IP PROGRESS NOTE  Subjective:   Melissa Hurley has been followed at [facility] and by Dr. Lorenso Courier with a diagnosis of metastatic cholangiocarcinoma.  She was last treated with gemcitabine/cisplatin chemotherapy on 12/01/2019.  She was admitted with severe pancytopenia and bacteremia last month. Melissa Hurley was readmitted 12/20/2019 with hypotension, malaise, and altered mental status.  She is now minimally responsive.  Her husband is at the bedside.  She reports her clinical status has declined for several weeks.  She was ambulatory with assistance prior to hospital admission.  Interval History: --Melissa Hurley has a worsening clinical picture, with marked decline in mental/functional status from admission on 01/13/2020.  --CT Scan performed on 01/15/2020 shows clear progression disease. --Hospice care services have been requested.   Objective: Vital signs in last 24 hours: Blood pressure 132/78, pulse (!) 130, temperature 98.3 F (36.8 C), temperature source Oral, resp. rate 20, height 5\' 7"  (1.702 m), weight 206 lb 12.7 oz (93.8 kg), last menstrual period 10/25/2014, SpO2 100 %.  Intake/Output from previous day: 05/31 0701 - 06/01 0700 In: 1510.2 [I.V.:1510.2] Out: 200 [Urine:200]  Lab Results: Recent Labs    01/16/20 0812  WBC 11.4*  HGB 7.3*  HCT 24.1*  PLT 81*    BMET Recent Labs    01/16/20 0812  NA 142  K 3.8  CL 116*  CO2 16*  GLUCOSE 87  BUN 10  CREATININE 1.24*  CALCIUM 6.6*    Lab Results  Component Value Date   CEA1 <1.00 08/06/2019    Studies/Results: No results found.  Medications: I have reviewed the patient's current medications.  Assessment/Plan:  #MetastaticIntrahepatic Cholangiocarcinoma, Stage IV 1) 07/30/2019: CT scan performed for generalized abdominal pain and bloating. Findings showed multiple hypervascular masses, abdominal lymphadenopathy, lung nodules, and thickening of the GE junction.  2) 08/04/2019: EGD showed candida infection of the  esophagus, small hiatal hernia, with no other concerning findings.Colonoscopy performed with no malignancy or abnormalities noted. 3) 08/06/2019: establish care with Dr. Lorenso Courier 4) 08/17/2019: US guided biopsy of the liver lesion, confirmed adenocarcinoma without clearly denoting the origin. Pancreaticobiliary vs GYN origin.  5) 08/26/2019: underwent endometrial biopsy with Ob/Gyn. No evidence of GYN malignancy.  6) 10/04/2019: started Cycle 1 Day 1 Gem/cis with Dr. Jimmy Footman 7) 11/03/2019: Cycle 2 Day 1 of Gem/cis (delayed due to CVA)  8) 11/24/2019: Cycle 3 Day 1 of Gem/cis 9) 12/16/2019: Reconnected with [facility] Team after admission for bacteremia.  10)01/03/2020: re-establish care with Dr. Lorenso Courier 11) CTs 01/15/2020-progressive metastatic disease involving the liver and lungs, increased pleural effusions, ascites 12) anemia/thrombocytopenia  #Right MCA infarct March 2021 #Admission with febrile neutropenia, sepsis April 2021 #Altered mental status  Melissa Hurley has metastatic cholangiocarcinoma.  She is admitted with failure to thrive and now appears obtunded.  Her clinical presentation is likely secondary to progression of the metastatic cholangiocarcinoma.  CTs obtained on 01/15/2020 confirmed disease progression in the lungs and liver.  She has anemia and thrombocytopenia, likely secondary to metastatic disease involving the bone marrow.  I have spoken with the patient's husband Melissa Hurley, who is at the bedside. He wanted to discuss my thoughts moving forward with care. I noted that her progression of disease was clear and her continued decline prevents her from being a treatment candidate now and in the future. Our hope had been that her worsening condition was due to her infection, however she continues to worsen. As such I do think that hospice would be most appropriate moving forward  and I would not recommend more aggressive approaches. Life prolonging measures at this time  would only minimally extend life at the cost of comfort.   I agree with Dr. Gearldine Shown assessment that hospice care and comfort measures are most appropriate and that she is a candidate for [facility]. I re-emphasized that remaining lifespan would be best measured in days. Melissa Hurley voiced his understanding.   Ledell Peoples, MD Department of Hematology/Oncology [place] at [facility] Phone: [phone] Pager: [phone] Email: [email or web address]

## 2020-01-18 NOTE — Progress Notes (Signed)
Aggressive oral care given, as she did wake up and was making a noise like she was choking and trying to clear her throat.  Upon assessment, found copious secretions and thick dried sputum chunks in her mouth on the roof of her mouth.  She was biting on the oral care suction yankauer and trying to pull on it.  She also was given a partial bath and was given Ativan, as she was becoming agitated and was pulling at things and trying to get out of the bed.  She has been asleep for several hours and did not need pain med or ativan for a while because was resting and appeared very calm.  On menses and passed a few small blood clots during bath.

## 2020-01-18 NOTE — Progress Notes (Signed)
Engineer, maintenance [facility]) Hospital Liaison note.   Received request from [place] for family interest in [facility]. [facility] is unable to offer a room today. I was unable to reach primary contact Chrissie Noa), however left a message to update on bed status.   Hospital Liaison will follow up tomorrow or sooner if a room becomes available.   Please do not hesitate to call with questions.   Thank you,  Clementeen Hoof, RN, BSN  [place] (listed on [place] under Hospice and [place] [facility] [place])   [phone]

## 2020-01-18 NOTE — Progress Notes (Signed)
Daily Progress Note   Patient Name: Melissa Hurley       Date: 01/18/2020 DOB: Dec 15, 1965  Age: 54 y.o. MRN#: [id number] Attending Physician: Barb Merino, MD Primary Care Physician: Patient, No Pcp Per Admit Date: 12/27/2019  Reason for Consultation/Follow-up: Establishing goals of care and Terminal Care  Subjective/GOC: Patient awake during visit but mildly agitated. Nonverbal, will not follow commands. Attempts to pull side rail to get out of bed. Too weak to pull herself up. Per husband, prn morphine and ativan helping manage symptoms. She will intermittently wake like this and immediately becomes agitated, but does have instant relief after medications are given.  Appreciate nursing staff on 3W who have done an excellent job with symptom management for Melissa Hurley.  Spoke with husband outside of the room. Other family members at bedside. Mr. Lesko asks about hospice transfer, for which I share there are no beds and we will continue to take it one day at a time. Certainly not transfer her out of the hospital if death seemed close. Emphasized continued focus comfort and symptom management medications. Again encouraged consideration of discontinuing IVF with fear of fluid overload and more discomfort as she nears EOL, already with anasarca. Husband struggles with this decision and will talk with the patient's sister. Husband does understand if she transfers to hospice facility, IVF will be discontinued.   Answered questions. Emotional support provided. Husband has PMT contact information.  Length of Stay: 6  Current Medications: Scheduled Meds:  . metoCLOPramide (REGLAN) injection  5 mg Intravenous Q8H  . mirtazapine  7.5 mg Oral QHS  . senna-docusate  1 tablet Oral BID    Continuous  Infusions: . dextrose 5 % and 0.9% NaCl 75 mL/hr at 01/18/20 0835    PRN Meds: acetaminophen **OR** acetaminophen (TYLENOL) oral liquid 160 mg/5 mL **OR** acetaminophen, loperamide, LORazepam, morphine injection, ondansetron (ZOFRAN) IV  Physical Exam Vitals and nursing note reviewed.  Constitutional:      Appearance: She is ill-appearing.  HENT:     Head: Normocephalic and atraumatic.  Pulmonary:     Effort: No tachypnea, accessory muscle usage or respiratory distress.  Abdominal:     Tenderness: There is no abdominal tenderness.  Skin:    General: Skin is warm and dry.  Neurological:     Mental Status: She is  easily aroused.     Comments: Lethargic. When awake, agitated and attempts to pull herself up.             Vital Signs: BP 132/78 (BP Location: Right Arm)   Pulse (!) 130   Temp 98.3 F (36.8 C) (Oral)   Resp 20   Ht 5\' 7"  (1.702 m)   Wt 93.8 kg   LMP 10/25/2014   SpO2 100%   BMI 32.39 kg/m  SpO2: SpO2: 100 % O2 Device: O2 Device: Room Air O2 Flow Rate:    Intake/output summary:   Intake/Output Summary (Last 24 hours) at 01/18/2020 1111 Last data filed at 01/18/2020 0400 Gross per 24 hour  Intake 1210.27 ml  Output 200 ml  Net 1010.27 ml   LBM: Last BM Date: 01/15/20 Baseline Weight: Weight: 93.8 kg Most recent weight: Weight: 93.8 kg       Palliative Assessment/Data: PPS 10%    Flowsheet Rows     Most Recent Value  Intake Tab  Referral Department  Hospitalist  Unit at Time of Referral  Med/Surg Unit  Palliative Care Primary Diagnosis  Cancer  Date Notified  01/15/20  Palliative Care Type  New Palliative care  Reason for referral  Clarify Goals of Care, Psychosocial or Spiritual support  Date of Admission  01/06/2020  Date first seen by Palliative Care  01/15/20  # of days Palliative referral response time  0 Day(s)  # of days IP prior to Palliative referral  3  Clinical Assessment  Palliative Performance Scale Score  10%  Pain Max last 24  hours  Not able to report  Pain Min Last 24 hours  Not able to report  Dyspnea Max Last 24 Hours  Not able to report  Dyspnea Min Last 24 hours  Not able to report  Psychosocial & Spiritual Assessment  Palliative Care Outcomes  Patient/Family meeting held?  Yes  Who was at the meeting?  husband  Palliative Care Outcomes  Clarified goals of care, Counseled regarding hospice, Provided end of life care assistance, Provided psychosocial or spiritual support      Patient Active Problem List   Diagnosis Date Noted  . Acute metabolic encephalopathy 99991111  . Cholangiocarcinoma ([place])   . Anxiety state   . Protein-calorie malnutrition, severe 01/15/2020  . Terminal care   . Palliative care by specialist   . Bloodstream infection due to Port-A-Cath 12/31/2019  . Intracerebral hemorrhage ([place]) 01/11/2020  . PICC (peripherally inserted central catheter) in place 01/03/2020  . Malnutrition of moderate degree 12/18/2019  . Bacteremia   . Pericardial effusion   . Pancytopenia ([place]) 12/10/2019  . Nausea & vomiting 12/10/2019  . Dehydration 12/10/2019  . AKI (acute kidney injury) ([place]) 12/10/2019  . SIRS (systemic inflammatory response syndrome) ([place]) 12/10/2019  . Metastatic malignant neoplasm ([place]) 12/10/2019  . History of CVA (cerebrovascular accident) 12/10/2019  . Hypokalemia 12/10/2019  . Hypocalcemia 12/10/2019  . Acute CVA (cerebrovascular accident) ([place]) 10/25/2019  . Anemia 10/25/2019  . Fibroid uterus 08/27/2019    Palliative Care Assessment & Plan   Patient Profile: 54 y.o. female  with past medical history of metastatic intrahepatic cholangiocarcinoma stage 4, recent L MCA CVA, admit for bacteremia end of April at [facility], admitted on 01/16/2020 with hypotension, recent L MCA stroke, cholangiocarcinoma with mets/FTT, debility. MRI brain 5/26 did not reveal hemorrhagic conversion of previous infarcts. CT ab/pelvis and chest revealed progression of disease with increase in size  and number of hepatic masses compared to  prior CT, multiple bilateral pulmonary nodules consistent with metastatic disease, small ascites and anasarca. On 5/30, patient with worsening confusion, agitation and poor oral intake. Severe debility and failure to thrive. Attending, Dr Sloan Leiter recommending DNR, comfort focused care and hospice consideration with very poor prognosis.   Assessment: Metastatic cholangiocarcinoma with CT scan revealing progression Adult failure to thrive Hypotension Recent history of left MCA CVA Anemia Thrombocytopenia Weakness Poor oral intake  Recommendations/Plan:  Comfort measures only.   Continue prn medications to ensure comfort/relief from suffering.   Comfort feeds per patient/family request. Aspiration precautions.  Husband wishes to continue IVF until sisters arrive. Education provided on life-prolonging nature of fluids as well as risk for fluid overload & further discomfort. Husband will further discuss with sister. Instructed RN that she can discontinue IVF at any time once husband is ready. Husband understands if she transfers to hospice home, IVF will be discontinued.   Unrestricted visitor access. Please allow patient's family to visit as she is declining quickly and nearing EOL.   Code Status: DNR   Code Status Orders  (From admission, onward)         Start     Ordered   12/21/2019 1612  Full code  Continuous     12/23/2019 1613        Code Status History    Date Active Date Inactive Code Status Order ID Comments User Context   12/10/2019 1953 12/18/2019 2123 Full Code [id number]  Dessa Phi, DO ED   10/25/2019 2204 10/28/2019 1855 Full Code [id number]  Shela Leff, MD ED   Advance Care Planning Activity       Prognosis:   Poor prognosis: Likely days with progressive metastatic cholangiocarcinoma, failure to thrive  Discharge Planning: To Be Determined : Hospice facility versus hospital death  Care plan was discussed with  RN, husband, family at bedside.  Thank you for allowing the Palliative Medicine Team to assist in the care of this patient.   Time In: 1040 Time Out: 1100 Total Time 20 Prolonged Time Billed no    Greater than 50% of this time was spent counseling and coordinating care related to the above assessment and plan.   Ihor Dow, DNP, FNP-C  Palliative Medicine Team  Phone: [phone] Fax: [phone]  Please contact Palliative Medicine Team phone at [phone] for questions and concerns.

## 2020-01-18 DEATH — deceased

## 2020-01-19 DIAGNOSIS — R451 Restlessness and agitation: Secondary | ICD-10-CM

## 2020-01-19 MED ORDER — BISACODYL 10 MG RE SUPP: 10.0000 mg | Freq: Every day | RECTAL | Status: DC | PRN

## 2020-01-19 MED ORDER — LORAZEPAM 2 MG/ML IJ SOLN
1.0000 mg | INTRAMUSCULAR | Status: DC
  Administered 2020-01-19 – 2020-01-20 (×4): 1 mg via INTRAVENOUS
  Filled 2020-01-19 (×4): qty 1

## 2020-01-19 MED ORDER — HALOPERIDOL LACTATE 5 MG/ML IJ SOLN
2.0000 mg | Freq: Four times a day (QID) | INTRAMUSCULAR | Status: DC | PRN
  Filled 2020-01-19: qty 1

## 2020-01-19 MED ORDER — MORPHINE BOLUS VIA INFUSION
2.0000 mg | INTRAVENOUS | Status: DC | PRN
  Administered 2020-01-19 (×2): 2 mg via INTRAVENOUS
  Administered 2020-01-19: 3 mg via INTRAVENOUS
  Administered 2020-01-19: 2 mg via INTRAVENOUS
  Filled 2020-01-19: qty 4

## 2020-01-19 MED ORDER — MORPHINE 100MG IN NS 100ML (1MG/ML) PREMIX INFUSION
2.0000 mg/h | INTRAVENOUS | Status: DC
  Administered 2020-01-19: 1 mg/h via INTRAVENOUS
  Filled 2020-01-19: qty 100

## 2020-01-19 MED ORDER — GLYCOPYRROLATE 0.2 MG/ML IJ SOLN: 0.2000 mg | INTRAMUSCULAR | Status: DC | PRN

## 2020-01-19 MED ORDER — LORAZEPAM 2 MG/ML IJ SOLN: 1.0000 mg | INTRAMUSCULAR | Status: DC | PRN

## 2020-01-19 NOTE — Progress Notes (Signed)
PROGRESS NOTE    Melissa Hurley  [id number] DOB: 1966/06/28 DOA: 01/16/2020 PCP: Patient, No Pcp Per    Brief Narrative:  54 year old female with history of recently diagnosed metastatic cholangiocarcinoma on chemotherapy, recent bacteremia with Corynebacterium in the setting of Port-A-Cath infection that was removed and treated with vancomycin.  Left MCA CVA in March 2021 and currently on Eliquis.  She was following up at ID office on 5/26 and was found to be hypotensive and lethargic and sent to the ER.  Initial CT scan of the head suspected of hemorrhagic conversion of right MCA infarct so patient was admitted to neuro ICU.  5/26, admitted to neuro ICU from [facility] emergency room.CT head, 5/26 possible interval hemorrhage right MCA artery distribution infarct without mass-effect. MRI of the brain 5/26, expected interval evolution of subacute cortical and subcortical infarcts involving bilateral cerebral hemispheres. Cultures negative.  5/29, continues to be extremely weak, shaky.  Hematuria. 5/30, remains more lethargic, confused, altered mentation. 5/31, lethargic and encephalopathic.  Not eating.  Nearing end-of-life.  Comfort care started. 6/1, remains with episodic gasping of breath, obtunded. 6/2, uncomfortable and distressed, started on morphine infusion.  Assessment & Plan:   Active Problems:   Metastatic malignant neoplasm (HCC)   Intracerebral hemorrhage (HCC)   Protein-calorie malnutrition, severe   Terminal care   Palliative care by specialist   Acute metabolic encephalopathy   Cholangiocarcinoma ([place])   Anxiety state   Agitation  Metastatic cholangiocarcinoma with metastasis to liver and lungs  Failure to thrive  Severe physical debility  Severe metabolic encephalopathy with liver failure, anemia and thrombocytopenia  End-of-life care   Plan : Patient with advanced cancer and debility , now with encephalopathy at end-of-life. Started on comfort care and  hospice level of care.  Needing frequent morphine injection, still remains uncomfortable.  Start on morphine infusion.  Will increase dose as needed.   As needed Ativan.  Add antisecretory medications.   Appreciate palliative care involvement and help.  Patient is rapidly deteriorating and is very uncomfortable today, will not recommend transfer to hospice. Today, will provide end-of-life care in the hospital.  She is too uncomfortable to transfer to hospice today.  She may pass away in the hospital.   DVT prophylaxis: Comfort care Code Status: Comfort care Family Communication: Patient's husband. Disposition Plan: Status is: Inpatient  Remains inpatient appropriate because: Critical.  End-of-life.  Anticipate hospital death.  Dispo: The patient is from: Home              Anticipated d/c is to: Unknown.  Inpatient hospice, she may die in the hospital.              Anticipated d/c date is: Unknown.              Patient currently is not medically stable to d/c.  Anticipate hospital death.  Consultants:   Oncology  Neurology  PCCM  Procedures:   None  Antimicrobials:  Antibiotics Given (last 72 hours)    None         Subjective: Seen and examined.  Husband at the bedside.  Remains obtunded, episodes of wakefulness with agitation.  Objective: Vitals:   01/18/20 0800 01/18/20 0811 01/18/20 2145 01/19/20 1214  BP:  132/78 92/77 125/62  Pulse:  (!) 130 (!) 110 (!) 117  Resp:  20 20 18   Temp:  98.3 F (36.8 C) 98.6 F (37 C) 98.5 F (36.9 C)  TempSrc:  Oral Oral Oral  SpO2: 100%  100% 100% 100%  Weight:      Height:        Intake/Output Summary (Last 24 hours) at 01/19/2020 1307 Last data filed at 01/19/2020 1100 Gross per 24 hour  Intake 0.5 ml  Output --  Net 0.5 ml   Filed Weights   01/17/2020 2020  Weight: 93.8 kg    Examination: Physical Exam  Constitutional:  Sick looking.  Encephalopathic.  Icteric. Episodes of irregular breathing, agitation on  waking up in the stimulation.  Occasional posturing.   HENT:  Head: Atraumatic.  Eyes: Pupils are equal, round, and reactive to light.  Cardiovascular: Regular rhythm.  Pulmonary/Chest: Breath sounds normal.  Irregular breathing.  Abdominal: Bowel sounds are normal.      Data Reviewed: I have personally reviewed following labs and imaging studies  CBC: Recent Labs  Lab 01/05/2020 1348 01/16/2020 2222 01/13/20 0503 01/14/20 0507 01/16/20 0812  WBC 11.2*  --  10.2 10.9* 11.4*  NEUTROABS 7.9*  --   --   --  8.5*  HGB 6.9* 8.2* 8.1* 7.6* 7.3*  HCT 23.6* 27.3* 26.8* 25.1* 24.1*  MCV 87.7  --  86.2 86.9 86.4  PLT 138*  --  105* 95* 81*   Basic Metabolic Panel: Recent Labs  Lab 01/10/2020 1348 01/13/20 0503 01/13/20 1853 01/14/20 0507 01/14/20 2100 01/15/20 0500 01/15/20 0837 01/16/20 0812  NA 141   < > 141 139 140  --  142 142  K 3.3*   < > 4.0 3.0* 3.8  --  3.5 3.8  CL 110   < > 113* 115* 112*  --  116* 116*  CO2 22   < > 20* 18* 18*  --  17* 16*  GLUCOSE 101*   < > 85 108* 104*  --  99 87  BUN 16   < > 11 13 14   --  11 10  CREATININE 1.28*   < > 1.29* 1.18* 1.30*  --  1.20* 1.24*  CALCIUM 6.3*   < > 6.4* 6.4* 6.6*  --  6.5* 6.6*  MG 1.1*  --   --  2.1  --  1.6*  --  1.6*  PHOS  --   --  2.5 2.5  --  1.9*  --  2.2*   < > = values in this interval not displayed.   GFR: Estimated Creatinine Clearance: 62.4 mL/min (A) (by C-G formula based on SCr of 1.24 mg/dL (H)). Liver Function Tests: Recent Labs  Lab 01/02/2020 1348 01/13/20 0503 01/16/20 0812  AST 33 25 24  ALT 16 13 14   ALKPHOS 246* 215* 229*  BILITOT 1.3* 1.1 1.0  PROT 5.7* 5.1* 5.6*  ALBUMIN 1.9* 1.4* 1.5*   No results for input(s): LIPASE, AMYLASE in the last 168 hours. Recent Labs  Lab 01/01/2020 1348  AMMONIA 25   Coagulation Profile: Recent Labs  Lab 01/15/2020 1348 01/16/20 0812  INR 3.0* 2.5*   Cardiac Enzymes: No results for input(s): CKTOTAL, CKMB, CKMBINDEX, TROPONINI in the last 168  hours. BNP (last 3 results) No results for input(s): PROBNP in the last 8760 hours. HbA1C: No results for input(s): HGBA1C in the last 72 hours. CBG: No results for input(s): GLUCAP in the last 168 hours. Lipid Profile: No results for input(s): CHOL, HDL, LDLCALC, TRIG, CHOLHDL, LDLDIRECT in the last 72 hours. Thyroid Function Tests: No results for input(s): TSH, T4TOTAL, FREET4, T3FREE, THYROIDAB in the last 72 hours. Anemia Panel: No results for input(s): VITAMINB12, FOLATE, FERRITIN, TIBC, IRON,  RETICCTPCT in the last 72 hours. Sepsis Labs: Recent Labs  Lab 01/16/2020 1348 12/30/2019 1735  LATICACIDVEN 3.0* 3.5*    Recent Results (from the past 240 hour(s))  Urine culture     Status: None   Collection Time: 12/30/2019 12:56 PM   Specimen: In/Out Cath Urine  Result Value Ref Range Status   Specimen Description   Final    IN/OUT CATH URINE Performed at [facility], [place] [address], [place], [place] [address]    Special Requests   Final    NONE Performed at [facility], [place] [address] [place], [place] [address]    Culture   Final    NO GROWTH Performed at [facility], [place] [address] [place], [place] [address]    Report Status 01/14/2020 FINAL  Final  SARS Coronavirus 2 by RT PCR (hospital order, performed in [facility] hospital lab) Nasopharyngeal Nasopharyngeal Swab     Status: None   Collection Time: 12/27/2019  1:48 PM   Specimen: Nasopharyngeal Swab  Result Value Ref Range Status   SARS Coronavirus 2 NEGATIVE NEGATIVE Final    Comment: (NOTE) SARS-CoV-2 target nucleic acids are NOT DETECTED. The SARS-CoV-2 RNA is generally detectable in upper and lower respiratory specimens during the acute phase of infection. The lowest concentration of SARS-CoV-2 viral copies this assay can detect is 250 copies / mL. A negative result does not preclude SARS-CoV-2 infection and should not be used as the sole basis for treatment or  other patient management decisions.  A negative result may occur with improper specimen collection / handling, submission of specimen other than nasopharyngeal swab, presence of viral mutation(s) within the areas targeted by this assay, and inadequate number of viral copies (<250 copies / mL). A negative result must be combined with clinical observations, patient history, and epidemiological information. Fact Sheet for Patients:   StrictlyIdeas.no Fact Sheet for Healthcare Providers: BankingDealers.co.za This test is not yet approved or cleared  by the [place] FDA and has been authorized for detection and/or diagnosis of SARS-CoV-2 by FDA under an Emergency Use Authorization (EUA).  This EUA will remain in effect (meaning this test can be used) for the duration of the COVID-19 declaration under Section 564(b)(1) of the Act, 21 U.S.C. section 360bbb-3(b)(1), unless the authorization is terminated or revoked sooner. Performed at [facility], [place] [address], [place], [place] [address]   Blood Culture (routine x 2)     Status: None   Collection Time: 01/05/2020  2:00 PM   Specimen: BLOOD  Result Value Ref Range Status   Specimen Description BLOOD SITE NOT SPECIFIED  Final   Special Requests   Final    BOTTLES DRAWN AEROBIC AND ANAEROBIC Blood Culture adequate volume Performed at [place] [address], [place], [place] [address]    Culture   Final    NO GROWTH 5 DAYS Performed at [facility], [place] [address], [place], [place] [address]    Report Status 01/18/2020 FINAL  Final  MRSA PCR Screening     Status: None   Collection Time: 01/04/2020  8:40 PM   Specimen: Nasal Mucosa; Nasopharyngeal  Result Value Ref Range Status   MRSA by PCR NEGATIVE NEGATIVE Final    Comment:        The GeneXpert MRSA Assay (FDA approved for NASAL specimens only), is one component of a comprehensive  MRSA colonization surveillance program. It is not intended to diagnose  MRSA infection nor to guide or monitor treatment for MRSA infections. Performed at [facility], [place] [address], [place], [place] [address]          Radiology Studies: No results found.      Scheduled Meds: . LORazepam  1 mg Intravenous Q4H   Continuous Infusions: . morphine 1 mg/hr (01/19/20 1031)     LOS: 7 days    Time spent: 30 minutes     Barb Merino, MD [facility] Pager [phone]

## 2020-01-19 NOTE — Progress Notes (Signed)
Manufacturing engineer [facility])  [place] has a bed for Ms. Strine today, however patient is very uncomfortable and may not be stable to transport today.    Attending MD and PMT updated.  Will keep Ms. Bellows on our waiting list.  Thank you, Venia Carbon RN, BSN, [place] [facility] Liaison

## 2020-01-19 NOTE — Progress Notes (Signed)
Daily Progress Note   Patient Name: Melissa Hurley       Date: 01/19/2020 DOB: [birth year]  Age: 54 y.o. MRN#: [id number] Attending Physician: Barb Merino, MD Primary Care Physician: Patient, No Pcp Per Admit Date: 01/08/2020  Reason for Consultation/Follow-up: Establishing goals of care and Terminal Care  Subjective: Upon arrival to room, Melissa Hurley appears agitated. She is pulling at gown and grabbing side-rail. She is non-verbal and does not follow commands or show meaningful interaction.   GOC:  Husband, Chrissie Noa at bedside. [place] has required frequent prns with minimal relief. Chrissie Noa feels ativan is wearing off faster now between doses. Discussed consideration of starting morphine gtt and scheduled ativan to make Melissa Hurley more comfortable through this process. Chrissie Noa shares anything to keep her comfortable. He agrees with medication changes. Dr. Sloan Leiter notified who also agrees with initiation of morphine infusion. Answered questions for Mr. Salmeron. Emotional/spiritual support provided.   Updated RN on care plan and encouraged she call me with concerns or symptom management needs today.  Length of Stay: 7  Current Medications: Scheduled Meds:  . LORazepam  1 mg Intravenous Q4H    Continuous Infusions: . morphine      PRN Meds: [DISCONTINUED] acetaminophen **OR** [DISCONTINUED] acetaminophen (TYLENOL) oral liquid 160 mg/5 mL **OR** acetaminophen, bisacodyl, glycopyrrolate, haloperidol lactate, LORazepam, morphine injection, morphine, ondansetron (ZOFRAN) IV  Physical Exam Vitals and nursing note reviewed.  Constitutional:      Appearance: She is ill-appearing.  HENT:     Head: Normocephalic and atraumatic.  Cardiovascular:     Heart sounds: Normal heart sounds.  Pulmonary:       Effort: No tachypnea, accessory muscle usage or respiratory distress.     Breath sounds: Normal breath sounds.  Skin:    General: Skin is warm and dry.     Comments: anasarca  Neurological:     Mental Status: She is easily aroused.     Comments: Restless, agitated. Non-verbal, not following commands.             Vital Signs: BP 92/77 (BP Location: Right Arm)   Pulse (!) 110   Temp 98.6 F (37 C) (Oral)   Resp 20   Ht 5\' 7"  (1.702 m)   Wt 93.8 kg   LMP 10/25/2014   SpO2 100%   BMI 32.39 kg/m  SpO2: SpO2: 100 % O2 Device: O2 Device: Room Air O2 Flow Rate:    Intake/output summary:  No intake or output data in the 24 hours ending 01/19/20 0839 LBM: Last BM Date: 01/15/20 Baseline Weight: Weight: 93.8 kg Most recent weight: Weight: 93.8 kg       Palliative Assessment/Data: PPS 10%    Flowsheet Rows     Most Recent Value  Intake Tab  Referral Department  Hospitalist  Unit at Time of Referral  Med/Surg Unit  Palliative Care Primary Diagnosis  Cancer  Date Notified  01/15/20  Palliative Care Type  New Palliative care  Reason for referral  Clarify Goals of Care, Psychosocial or Spiritual support  Date of Admission  12/19/2019  Date first seen by Palliative Care  01/15/20  # of days Palliative referral response time  0 Day(s)  # of days IP prior to Palliative referral  3  Clinical Assessment  Palliative Performance Scale Score  10%  Pain Max last 24 hours  Not able to report  Pain Min Last 24 hours  Not able to report  Dyspnea Max Last 24 Hours  Not able to report  Dyspnea Min Last 24 hours  Not able to report  Psychosocial & Spiritual Assessment  Palliative Care Outcomes  Patient/Family meeting held?  Yes  Who was at the meeting?  husband  Palliative Care Outcomes  Clarified goals of care, Counseled regarding hospice, Provided end of life care assistance, Provided psychosocial or spiritual support      Patient Active Problem List   Diagnosis Date Noted  .  Acute metabolic encephalopathy 99991111  . Cholangiocarcinoma ([place])   . Anxiety state   . Protein-calorie malnutrition, severe 01/15/2020  . Terminal care   . Palliative care by specialist   . Bloodstream infection due to Port-A-Cath 01/05/2020  . Intracerebral hemorrhage ([place]) 01/16/2020  . PICC (peripherally inserted central catheter) in place 01/03/2020  . Malnutrition of moderate degree 12/18/2019  . Bacteremia   . Pericardial effusion   . Pancytopenia ([place]) 12/10/2019  . Nausea & vomiting 12/10/2019  . Dehydration 12/10/2019  . AKI (acute kidney injury) ([place]) 12/10/2019  . SIRS (systemic inflammatory response syndrome) ([place]) 12/10/2019  . Metastatic malignant neoplasm ([place]) 12/10/2019  . History of CVA (cerebrovascular accident) 12/10/2019  . Hypokalemia 12/10/2019  . Hypocalcemia 12/10/2019  . Acute CVA (cerebrovascular accident) ([place]) 10/25/2019  . Anemia 10/25/2019  . Fibroid uterus 08/27/2019    Palliative Care Assessment & Plan   Patient Profile: 54 y.o. female  with past medical history of metastatic intrahepatic cholangiocarcinoma stage 4, recent L MCA CVA, admit for bacteremia end of April at [facility], admitted on 12/19/2019 with hypotension, recent L MCA stroke, cholangiocarcinoma with mets/FTT, debility. MRI brain 5/26 did not reveal hemorrhagic conversion of previous infarcts. CT ab/pelvis and chest revealed progression of disease with increase in size and number of hepatic masses compared to prior CT, multiple bilateral pulmonary nodules consistent with metastatic disease, small ascites and anasarca. On 5/30, patient with worsening confusion, agitation and poor oral intake. Severe debility and failure to thrive. Attending, Dr Sloan Leiter recommending DNR, comfort focused care and hospice consideration with very poor prognosis.   Assessment: Metastatic cholangiocarcinoma with CT scan revealing progression Adult failure to thrive Hypotension Recent history of left MCA  CVA Anemia Thrombocytopenia Weakness Poor oral intake  Recommendations/Plan:  Comfort measures only.   Unrestricted visitor access. Please allow patient's family to visit as she is declining quickly and nearing EOL.   Symptom  management  Remains uncomfortable despite frequent prns.  Initiate Morphine infusion 1mg /hr  RN may bolus morphine via infusion 2-4mg  IV q19min prn pain/dyspnea/air hunger/tachypnea  Ativan 1mg  IV q4h scheduled  Ativan 1-2mg  IV q4h prn breakthrough agitation/anxiety  Haldol 2mg  IV q6h prn agitation  Robinul 0.2mg  IV q4h prn secretions  Dulcolax suppository daily prn constipation  Code Status: DNR   Code Status Orders  (From admission, onward)         Start     Ordered   01/08/2020 1612  Full code  Continuous     01/04/2020 1613        Code Status History    Date Active Date Inactive Code Status Order ID Comments User Context   12/10/2019 1953 12/18/2019 2123 Full Code [id number]  Dessa Phi, DO ED   10/25/2019 2204 10/28/2019 1855 Full Code [id number]  Shela Leff, MD ED   Advance Care Planning Activity       Prognosis:   Poor prognosis: Likely days with progressive metastatic cholangiocarcinoma, failure to thrive  Discharge Planning: To Be Determined : Hospice facility versus hospital death  Care plan was discussed with RN, Dr. Sloan Leiter, husband at bedside  Thank you for allowing the Palliative Medicine Team to assist in the care of this patient.   Time In: 0820 Time Out: 0845 Total Time 25 Prolonged Time Billed no    Greater than 50% of this time was spent counseling and coordinating care related to the above assessment and plan.   Ihor Dow, DNP, FNP-C  Palliative Medicine Team  Phone: [phone] Fax: [phone]  Please contact Palliative Medicine Team phone at [phone] for questions and concerns.

## 2020-01-20 ENCOUNTER — Ambulatory Visit (HOSPITAL_COMMUNITY): Payer: BC Managed Care – PPO

## 2020-02-17 NOTE — Progress Notes (Signed)
Rn notified by pt husband that he thought pt had expired. This RN and Philomena, Agricultural consultant listened for two minutes without any audible heart or lung sounds. Both Rns pronounced time of death at 69.  [place] [facility] notified at [phone].  Pt husband has not decided on funeral home as of time of death.

## 2020-02-17 NOTE — Progress Notes (Signed)
75 ml of Morphine wasted with Beckie Busing, Agricultural consultant

## 2020-02-17 NOTE — Death Summary Note (Signed)
DEATH SUMMARY   Patient Details  Name: Melissa Hurley MRN: [id number] DOB: 05-23-1966  Admission/Discharge Information   Admit Date:  January 20, 2020  Date of Death: Date of Death: January 28, 2020  Time of Death: Time of Death: 0442  Length of Stay: Nov 02, 2022  Referring Physician: Patient, No Pcp Per   Reason(s) for Hospitalization  Hypotension, failure to thrive with metastatic disease  Diagnoses  Preliminary cause of death:  Secondary Diagnoses (including complications and co-morbidities):  Active Problems:   Metastatic malignant neoplasm (HCC)   Intracerebral hemorrhage (HCC)   Protein-calorie malnutrition, severe   Terminal care   Palliative care by specialist   Acute metabolic encephalopathy   [facility])   Anxiety state   Agitation   Brief Hospital Course (including significant findings, care, treatment, and services provided and events leading to death)  Melissa Hurley is a 54 y.o. year old female who was recently diagnosed with metastatic cholangiocarcinoma and was getting palliative chemotherapy.  She recently developed Port-A-Cath associated bacterial infection and was treated with IV antibiotics.  Patient also recently suffered from left MCA stroke.  Patient continued to do poorly.  Patient was seen in the infectious disease clinic where she was found hypotensive and confused and she was sent to emergency room.  Patient with advanced cancer and debility, developed metabolic encephalopathy, end-stage encephalopathy.  Patient is stopped eating and was very uncomfortable with nausea, anorexia.  Patient was treated with comfort care, end-of-life care was provided in the hospital with hospice philosophy.  Patient died in the hospital on 2020-01-28 at 4:42 AM while on comfort care measures.  Family at the bedside.  Pertinent Labs and Studies  Significant Diagnostic Studies CT HEAD WO CONTRAST  Result Date: January 20, 2020 CLINICAL DATA:  Intracranial hemorrhage EXAM: CT HEAD  WITHOUT CONTRAST TECHNIQUE: Contiguous axial images were obtained from the base of the skull through the vertex without intravenous contrast. COMPARISON:  None. FINDINGS: Brain: Cortical hyperdensity within the right temporal lobe is unchanged from the earlier scan. This is in the region of encephalomalacia related prior MCA territory infarct. There is mild volume loss in generalized pattern. Vascular: No abnormal hyperdensity of the major intracranial arteries or dural venous sinuses. No intracranial atherosclerosis. Skull: The visualized skull base, calvarium and extracranial soft tissues are normal. Sinuses/Orbits: No fluid levels or advanced mucosal thickening of the visualized paranasal sinuses. No mastoid or middle ear effusion. The orbits are normal. IMPRESSION: 1. Hyperdensity over the right temporal lobe is likely cortical laminar necrosis rather than acute hemorrhage. 2. Unchanged head CT. These results were called by telephone at the time of interpretation on 02/16/2020 at 8:23 pm to provider Samara Snide , who verbally acknowledged these results. Electronically Signed   By: Ulyses Jarred M.D.   On: 01/27/2020 20:23   CT Head Wo Contrast  Result Date: 01/24/2020 CLINICAL DATA:  Altered mental status. EXAM: CT HEAD WITHOUT CONTRAST TECHNIQUE: Contiguous axial images were obtained from the base of the skull through the vertex without intravenous contrast. COMPARISON:  Head CT and brain MR dated 2019-11-02 FINDINGS: Brain: Again demonstrated is low density in a right middle cerebral artery distribution with interval linear high density components measuring up to 88 Hounsfield units in density. No mass effect. Interval small area of low density in the left frontal white matter in an area of previously restricted diffusion on the MR. Stable mildly enlarged ventricles and cortical sulci. Vascular: No hyperdense vessel or unexpected calcification. Skull: Normal. Negative for fracture or focal lesion.  Sinuses/Orbits:  Unremarkable. Other: None. IMPRESSION: 1. Interval hemorrhage within the right middle cerebral artery distribution infarct without mass effect. 2. Previously demonstrated left frontal lobe white matter infarct. 3. Stable mild diffuse cerebral and cerebellar atrophy. Critical Value/emergent results were called by telephone at the time of interpretation on 12/18/2019 at 2:56 pm to provider [facility] RAY , who verbally acknowledged these results. Electronically Signed   By: Claudie Revering M.D.   On: 01/13/2020 14:57   CT CHEST W CONTRAST  Result Date: 01/15/2020 CLINICAL DATA:  54 year old female with metastatic cholangiocarcinoma. Patient is on chemotherapy. EXAM: CT CHEST, ABDOMEN, AND PELVIS WITH CONTRAST TECHNIQUE: Multidetector CT imaging of the chest, abdomen and pelvis was performed following the standard protocol during bolus administration of intravenous contrast. CONTRAST:  190mL OMNIPAQUE IOHEXOL 300 MG/ML  SOLN COMPARISON:  CT abdomen pelvis dated 07/30/2019. FINDINGS: Evaluation is limited due to streak artifact caused by patient's arms. CT CHEST FINDINGS Cardiovascular: There is no cardiomegaly. There is a small pericardial effusion measuring approximately 5 mm in thickness. Mild atherosclerotic calcification of the aortic arch. No aneurysmal dilatation or dissection. The origins of the great vessels of the aortic arch appear patent as visualized. Left-sided PICC with tip in the upper SVC. The central pulmonary arteries appear unremarkable. Mediastinum/Nodes: No hilar or mediastinal adenopathy. The esophagus is grossly unremarkable. No mediastinal fluid collection. Lungs/Pleura: Moderate right and small left pleural effusions, significantly increased in size since the CT of 07/30/2019. There is associated partial compressive atelectasis of the lower lobes. Pneumonia is not excluded. Multiple bilateral pulmonary nodules measure up to 6 mm in the left lower lobe consistent with metastatic  disease. There is no pneumothorax. The central airways are patent. Musculoskeletal: Indeterminate 2 cm lucent lesion in the right humeral head, possibly a bone cyst. Other etiologies are not excluded. No acute osseous pathology. CT ABDOMEN PELVIS FINDINGS Hepatobiliary: Multiple heterogeneously enhancing liver masses with the largest mass measuring approximately 12 x 10 cm (49/3). The largest mass demonstrates predominantly peripheral enhancement with areas of lower enhancement centrally, likely representing necrotic tissue. Several additional liver masses noted some of which are new and others have increased in size since the prior CT. There is probable background of fatty liver. No intrahepatic biliary ductal dilatation. No calcified gallstone. Pancreas: Unremarkable. No pancreatic ductal dilatation or surrounding inflammatory changes. Spleen: Normal in size without focal abnormality. Adrenals/Urinary Tract: The adrenal glands unremarkable. There is no hydronephrosis on either side. There is symmetric enhancement and excretion of contrast by both kidneys. Bilateral parapelvic cysts noted. The visualized ureters and urinary bladder are unremarkable. Stomach/Bowel: Diffuse thickened appearance of the colon, likely related to underdistention and ascites. Colitis is less likely but not excluded clinical correlation is recommended. There is no bowel obstruction. The appendix is normal. Vascular/Lymphatic: The abdominal aorta and IVC are unremarkable. No portal venous gas. Periportal adenopathy measures 13 mm in short axis (308/4). Multiple rounded and mildly enlarged top-normal retroperitoneal and para-aortic lymph nodes. Reproductive: The uterus is enlarged and myomatous. There is dilatation of the endometrial canal with irregularity of the endometrium which may represent proteinaceous or blood product or intracavitary fibroid. Other etiologies are not excluded. This can be better evaluated with MRI. Other: Interval  development of small ascites, new since the prior CT. There is diffuse subcutaneous edema and anasarca. Musculoskeletal: Degenerative changes of the spine. No acute osseous pathology. IMPRESSION: 1. Overall progression of disease with increase in the size and number of the hepatic masses compared to the prior CT. 2. Multiple bilateral  pulmonary nodules consistent with metastatic disease. 3. Interval development of small ascites and anasarca, new since the prior CT. 4. Bilateral pleural effusions, increased since the prior CT. 5. Diffuse thickened appearance of the colon likely related to underdistention and ascites. Colitis is less likely but not excluded. Clinical correlation is recommended. No bowel obstruction. Normal appendix. 6. Enlarged myomatous uterus. Thickened and irregular appearance of the endometrial canal may represent proteinaceous or blood product or intracavitary fibroid. Other etiologies are not excluded. This can be better evaluated with MRI. 7. Aortic Atherosclerosis (ICD10-I70.0). Electronically Signed   By: Anner Crete M.D.   On: 01/15/2020 15:30   MR BRAIN WO CONTRAST  Result Date: 01/13/2020 CLINICAL DATA:  Follow-up examination for stroke, calcification versus hemorrhage. EXAM: MRI HEAD WITHOUT CONTRAST TECHNIQUE: Multiplanar, multiecho pulse sequences of the brain and surrounding structures were obtained without intravenous contrast. COMPARISON:  Prior CT from earlier same day as well as previous MRI from 10/25/2019. FINDINGS: Brain: Generalized age-related cerebral atrophy. Mild chronic microvascular ischemic disease again noted within the supratentorial cerebral white matter. There has been interval evolution of recently identified infarcts involving the right temporal and parietal lobes as well as the left centrum semi ovale. Overall, distribution is relatively unchanged. Scattered areas of susceptibility artifact at the right temporal lobe corresponds with hyperdensity seen on  prior CT. 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 No evidence for hemorrhagic transformation or other complication. No other evidence for new or interval infarction. No acute intracranial hemorrhage. Additional single punctate chronic microhemorrhage noted at the right lentiform nucleus. No mass lesion, midline shift or mass effect. Ventricles normal size without hydrocephalus. No extra-axial fluid collection. Incidental note made of a partially empty sella. Midline structures intact. Vascular: Major intracranial vascular flow voids are maintained. Skull and upper cervical spine: Craniocervical junction normal. Bone marrow signal intensity within normal limits. No scalp soft tissue abnormality. Sinuses/Orbits: Globes and orbital soft tissues within normal limits. Mild scattered mucosal thickening noted within the maxillary sinuses bilaterally, right greater than left. Small bilateral mastoid effusions noted. Visualized nasopharynx within normal limits. Other: None. IMPRESSION: 1. Normal expected interval evolution of subacute cortical and subcortical infarcts involving the bilateral cerebral hemispheres. 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 No evidence for hemorrhagic transformation. 2. No other new acute intracranial abnormality. 3. Underlying atrophy with chronic small vessel ischemic disease, stable. Electronically Signed   By: Jeannine Boga M.D.   On: 12/25/2019 22:46   CT ABDOMEN PELVIS W CONTRAST  Result Date: 01/15/2020 CLINICAL DATA:  54 year old female with metastatic cholangiocarcinoma. Patient is on chemotherapy. EXAM: CT CHEST, ABDOMEN, AND PELVIS WITH CONTRAST TECHNIQUE: Multidetector CT imaging of the chest,  abdomen and pelvis was performed following the standard protocol during bolus administration of intravenous contrast. CONTRAST:  177mL OMNIPAQUE IOHEXOL 300 MG/ML  SOLN COMPARISON:  CT abdomen pelvis dated 07/30/2019. FINDINGS: Evaluation is limited due to streak artifact caused by patient's arms. CT CHEST FINDINGS Cardiovascular: There is no cardiomegaly. There is a small pericardial effusion measuring approximately 5 mm in thickness. Mild atherosclerotic calcification of the aortic arch. No aneurysmal dilatation or dissection. The origins of the great vessels of the aortic arch appear patent as visualized. Left-sided PICC with tip in the upper SVC. The central pulmonary arteries appear unremarkable. Mediastinum/Nodes: No hilar or mediastinal adenopathy. The esophagus is  grossly unremarkable. No mediastinal fluid collection. Lungs/Pleura: Moderate right and small left pleural effusions, significantly increased in size since the CT of 07/30/2019. There is associated partial compressive atelectasis of the lower lobes. Pneumonia is not excluded. Multiple bilateral pulmonary nodules measure up to 6 mm in the left lower lobe consistent with metastatic disease. There is no pneumothorax. The central airways are patent. Musculoskeletal: Indeterminate 2 cm lucent lesion in the right humeral head, possibly a bone cyst. Other etiologies are not excluded. No acute osseous pathology. CT ABDOMEN PELVIS FINDINGS Hepatobiliary: Multiple heterogeneously enhancing liver masses with the largest mass measuring approximately 12 x 10 cm (49/3). The largest mass demonstrates predominantly peripheral enhancement with areas of lower enhancement centrally, likely representing necrotic tissue. Several additional liver masses noted some of which are new and others have increased in size since the prior CT. There is probable background of fatty liver. No intrahepatic biliary ductal dilatation. No calcified gallstone. Pancreas: Unremarkable. No  pancreatic ductal dilatation or surrounding inflammatory changes. Spleen: Normal in size without focal abnormality. Adrenals/Urinary Tract: The adrenal glands unremarkable. There is no hydronephrosis on either side. There is symmetric enhancement and excretion of contrast by both kidneys. Bilateral parapelvic cysts noted. The visualized ureters and urinary bladder are unremarkable. Stomach/Bowel: Diffuse thickened appearance of the colon, likely related to underdistention and ascites. Colitis is less likely but not excluded clinical correlation is recommended. There is no bowel obstruction. The appendix is normal. Vascular/Lymphatic: The abdominal aorta and IVC are unremarkable. No portal venous gas. Periportal adenopathy measures 13 mm in short axis (308/4). Multiple rounded and mildly enlarged top-normal retroperitoneal and para-aortic lymph nodes. Reproductive: The uterus is enlarged and myomatous. There is dilatation of the endometrial canal with irregularity of the endometrium which may represent proteinaceous or blood product or intracavitary fibroid. Other etiologies are not excluded. This can be better evaluated with MRI. Other: Interval development of small ascites, new since the prior CT. There is diffuse subcutaneous edema and anasarca. Musculoskeletal: Degenerative changes of the spine. No acute osseous pathology. IMPRESSION: 1. Overall progression of disease with increase in the size and number of the hepatic masses compared to the prior CT. 2. Multiple bilateral pulmonary nodules consistent with metastatic disease. 3. Interval development of small ascites and anasarca, new since the prior CT. 4. Bilateral pleural effusions, increased since the prior CT. 5. Diffuse thickened appearance of the colon likely related to underdistention and ascites. Colitis is less likely but not excluded. Clinical correlation is recommended. No bowel obstruction. Normal appendix. 6. Enlarged myomatous uterus. Thickened and  irregular appearance of the endometrial canal may represent proteinaceous or blood product or intracavitary fibroid. Other etiologies are not excluded. This can be better evaluated with MRI. 7. Aortic Atherosclerosis (ICD10-I70.0). Electronically Signed   By: Anner Crete M.D.   On: 01/15/2020 15:30   DG Chest Port 1 View  Result Date: 12/30/2019 CLINICAL DATA:  Hypotension. PICC line EXAM: PORTABLE CHEST 1 VIEW COMPARISON:  12/17/2019 FINDINGS: Interval PICC with its tip in the inferior aspect of the superior vena cava approximately 1.2 cm above the superior cavoatrial junction. Normal sized heart. Clear lungs with normal vascularity. Unremarkable bones. IMPRESSION: 1. Interval PICC with its tip in the inferior aspect of the superior vena cava approximately 1.2 cm above the superior cavoatrial junction. 2. No acute abnormality. Electronically Signed   By: Claudie Revering M.D.   On: 12/27/2019 14:42   DG UGI W SINGLE CM (SOL OR THIN BA)  Result Date: 01/13/2020 CLINICAL DATA:  Nausea and vomiting.  Metastatic cholangiocarcinoma. EXAM: UPPER GI SERIES WITHOUT KUB TECHNIQUE: Routine upper GI series was performed with high-density barium. FLUOROSCOPY TIME:  Fluoroscopy Time:  2.7 minutes COMPARISON:  CT scan of the chest dated 09/22/2019 and CT scan of the abdomen and pelvis dated 07/30/2019 FINDINGS: Because of the patient's severe nausea she had difficulty ingesting the barium. She did aspirate a small amount of the thick barium upon initial swallow. She was able to cough and remove the small amount of aspirated barium. There is no evidence of esophageal mass or stricture. There is a 5 cm hiatal hernia with edema in the mucosa of hiatal hernia. No appreciable stricture. Patient was only able to ingest 2 swallows of barium. However, the barium did pass into the stomach. The contrast passed into the nondistended duodenal bulb and nondistended C-loop and beyond the ligament of Treitz into the nondistended  jejunum. There is no discrete mass in the stomach and there is no discrete ulcer. There is now a mass in the superior aspect of the left hilum silhouetting the left side of the arch of the aorta. There appears to be hyperinflation of right middle lobe suggesting that there may be a ball valve type obstruction of the right middle lobe bronchus. These findings are not apparent on the chest x-ray performed on 01/16/2020. IMPRESSION: 1. No evidence of obstruction of the esophagus or stomach or duodenum. 2. 5 cm hiatal hernia. 3. Slight edema in the mucosa of the hiatal hernia. 4. Patient aspirated a small amount of thick barium but was able to cough and expel the aspirated barium without difficulty. 5. Possible mass in the superior aspect of the left hilum. Possible hyperinflation of the right middle lobe. Electronically Signed   By: Lorriane Shire M.D.   On: 01/13/2020 16:26    Microbiology Recent Results (from the past 240 hour(s))  Urine culture     Status: None   Collection Time: 12/25/2019 12:56 PM   Specimen: In/Out Cath Urine  Result Value Ref Range Status   Specimen Description   Final    IN/OUT CATH URINE Performed at [facility], [place] [address] [place], [place] [address]    Special Requests   Final    NONE Performed at [facility], [place] [address] [place], [place] [address]    Culture   Final    NO GROWTH Performed at [facility], [place] [address], [place],  [address]    Report Status 01/14/2020 FINAL  Final  SARS Coronavirus 2 by RT PCR (hospital order, performed in [facility] hospital lab) Nasopharyngeal Nasopharyngeal Swab     Status: None   Collection Time: 12/25/2019  1:48 PM   Specimen: Nasopharyngeal Swab  Result Value Ref Range Status   SARS Coronavirus 2 NEGATIVE NEGATIVE Final    Comment: (NOTE) SARS-CoV-2 target nucleic acids are NOT DETECTED. The SARS-CoV-2 RNA is generally detectable in upper and lower respiratory  specimens during the acute phase of infection. The lowest concentration of SARS-CoV-2 viral copies this assay can detect is 250 copies / mL. A negative result does not preclude SARS-CoV-2 infection and should not be used as the sole basis for treatment or other patient management decisions.  A negative result may occur with improper specimen collection / handling, submission of specimen other than nasopharyngeal swab, presence of viral mutation(s) within the areas targeted by this assay, and inadequate number of viral copies (<250 copies / mL). A negative result must  be combined with clinical observations, patient history, and epidemiological information. Fact Sheet for Patients:   StrictlyIdeas.no Fact Sheet for Healthcare Providers: BankingDealers.co.za This test is not yet approved or cleared  by the [place] FDA and has been authorized for detection and/or diagnosis of SARS-CoV-2 by FDA under an Emergency Use Authorization (EUA).  This EUA will remain in effect (meaning this test can be used) for the duration of the COVID-19 declaration under Section 564(b)(1) of the Act, 21 U.S.C. section 360bbb-3(b)(1), unless the authorization is terminated or revoked sooner. Performed at [facility], [place] [address] [place], [place] [address]   Blood Culture (routine x 2)     Status: None   Collection Time: 12/27/2019  2:00 PM   Specimen: BLOOD  Result Value Ref Range Status   Specimen Description BLOOD SITE NOT SPECIFIED  Final   Special Requests   Final    BOTTLES DRAWN AEROBIC AND ANAEROBIC Blood Culture adequate volume Performed at [place] [address], [place], [place] [address]    Culture   Final    NO GROWTH 5 DAYS Performed at [facility], [place] [address], [place], [place] [address]    Report Status 01/18/2020 FINAL  Final  MRSA PCR Screening     Status: None   Collection Time:  01/11/2020  8:40 PM   Specimen: Nasal Mucosa; Nasopharyngeal  Result Value Ref Range Status   MRSA by PCR NEGATIVE NEGATIVE Final    Comment:        The GeneXpert MRSA Assay (FDA approved for NASAL specimens only), is one component of a comprehensive MRSA colonization surveillance program. It is not intended to diagnose MRSA infection nor to guide or monitor treatment for MRSA infections. Performed at [facility], [place] [address], [place], [place] [address]     Lab Basic Metabolic Panel: Recent Labs  Lab 01/13/20 1853 01/14/20 0507 01/14/20 2100 01/15/20 0500 01/15/20 0837 01/16/20 0812  NA 141 139 140  --  142 142  K 4.0 3.0* 3.8  --  3.5 3.8  CL 113* 115* 112*  --  116* 116*  CO2 20* 18* 18*  --  17* 16*  GLUCOSE 85 108* 104*  --  99 87  BUN 11 13 14   --  11 10  CREATININE 1.29* 1.18* 1.30*  --  1.20* 1.24*  CALCIUM 6.4* 6.4* 6.6*  --  6.5* 6.6*  MG  --  2.1  --  1.6*  --  1.6*  PHOS 2.5 2.5  --  1.9*  --  2.2*   Liver Function Tests: Recent Labs  Lab 01/16/20 0812  AST 24  ALT 14  ALKPHOS 229*  BILITOT 1.0  PROT 5.6*  ALBUMIN 1.5*   No results for input(s): LIPASE, AMYLASE in the last 168 hours. No results for input(s): AMMONIA in the last 168 hours. CBC: Recent Labs  Lab 01/14/20 0507 01/16/20 0812  WBC 10.9* 11.4*  NEUTROABS  --  8.5*  HGB 7.6* 7.3*  HCT 25.1* 24.1*  MCV 86.9 86.4  PLT 95* 81*   Cardiac Enzymes: No results for input(s): CKTOTAL, CKMB, CKMBINDEX, TROPONINI in the last 168 hours. Sepsis Labs: Recent Labs  Lab 01/14/20 0507 01/16/20 0812  WBC 10.9* 11.4*    Procedures/Operations     Barb Merino 02/09/20, 2:39 PM

## 2020-02-17 DEATH — deceased

## 2020-09-26 IMAGING — DX DG CHEST 1V PORT
1 series · 1 of 1 positions shown · non-contrast
Comparison: 12/17/2019

CLINICAL DATA: Hypotension. PICC line

EXAM:
PORTABLE CHEST 1 VIEW

[chest ap]
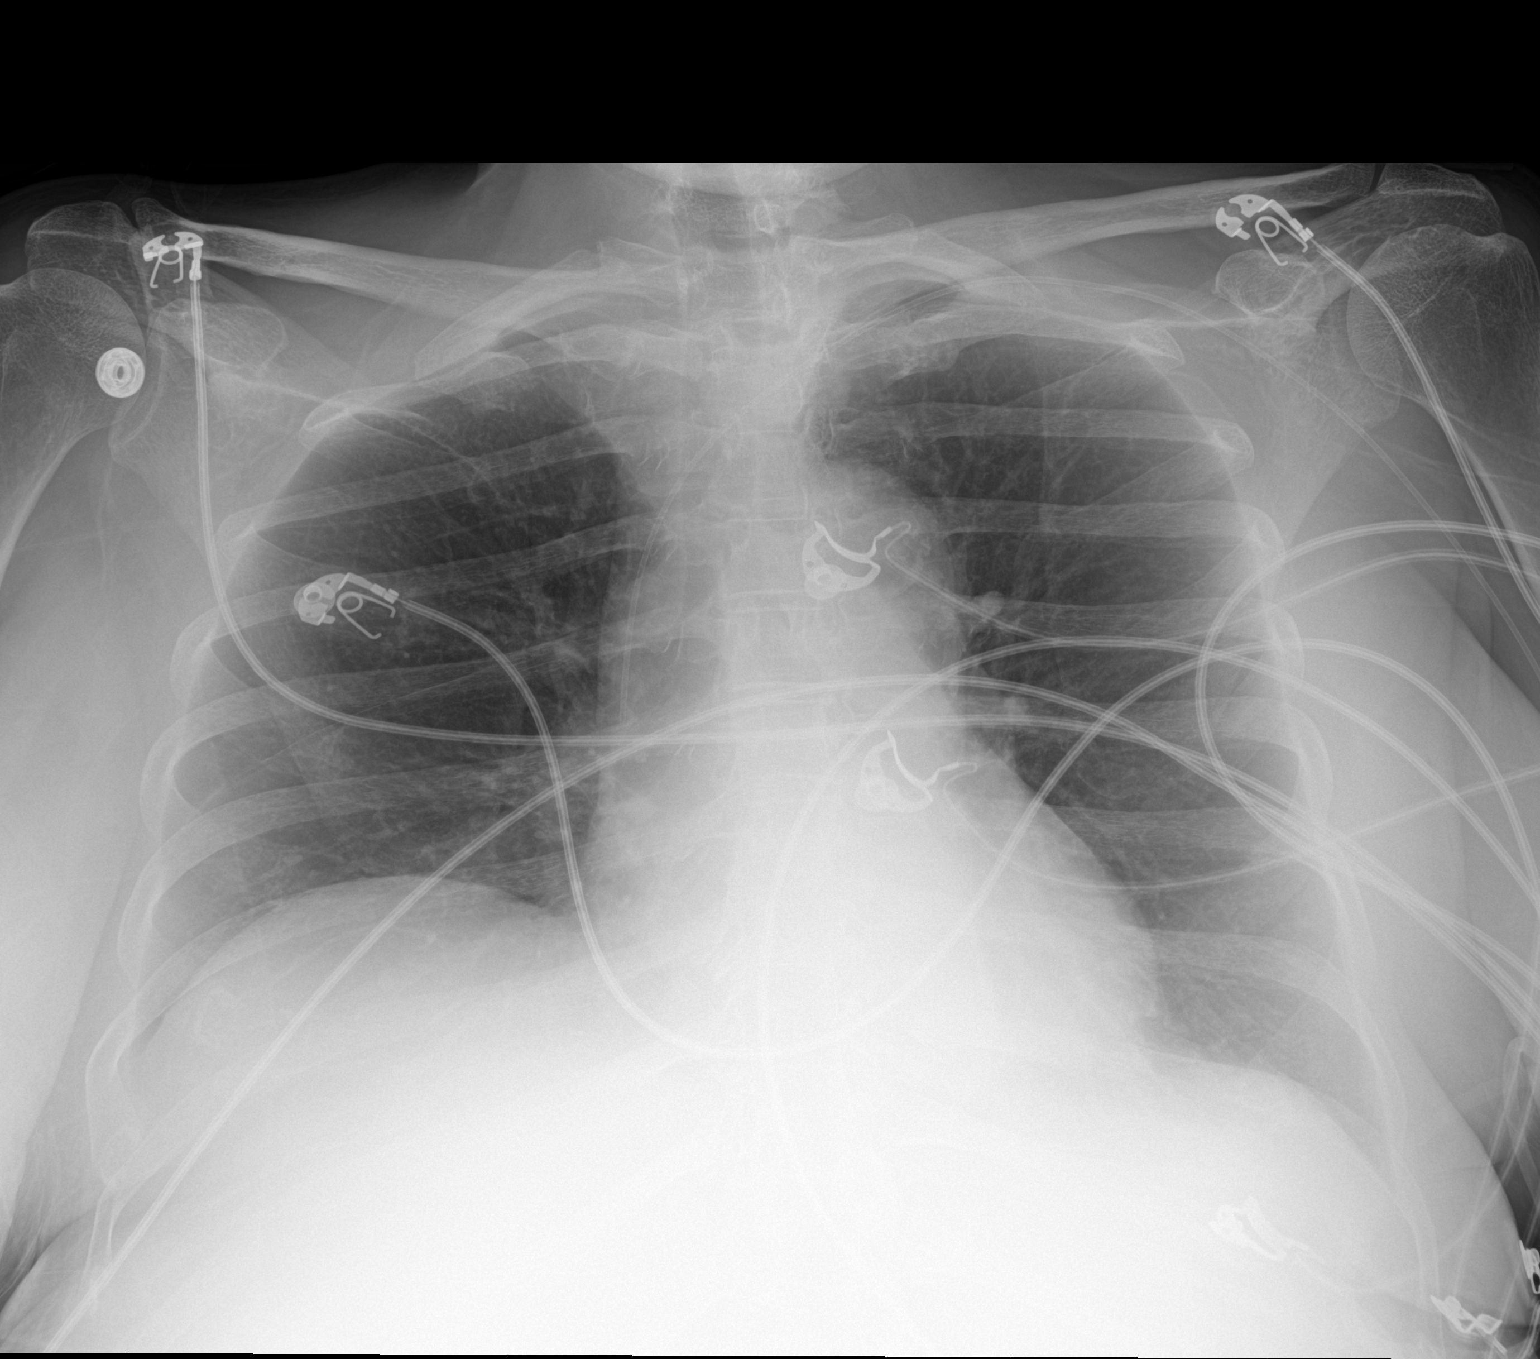

[1 of 1 positions shown; findings below may reference images not displayed]

FINDINGS: Interval PICC with its tip in the inferior aspect of the superior
vena cava approximately 1.2 cm above the superior cavoatrial
junction. Normal sized heart. Clear lungs with normal vascularity.
Unremarkable bones.
IMPRESSION: 1. Interval PICC with its tip in the inferior aspect of the superior
vena cava approximately 1.2 cm above the superior cavoatrial
junction.
2. No acute abnormality.

## 2020-09-29 IMAGING — CT CT ABD-PELV W/ CM
2 of 6 series · 12 of 36 positions shown, 15 images · IV contrast (Omni 300)
Comparison: CT abdomen pelvis dated 07/30/2019.

CLINICAL DATA: 51-year-old female with metastatic
cholangiocarcinoma. Patient is on chemotherapy.

EXAM:
CT CHEST, ABDOMEN, AND PELVIS WITH CONTRAST
TECHNIQUE: Multidetector CT imaging of the chest, abdomen and pelvis was
performed following the standard protocol during bolus
administration of intravenous contrast.
CONTRAST:  100mL OMNIPAQUE IOHEXOL 300 MG/ML  SOLN

[Series 4: cap 1.0 · axial · 0.98mm/px · z∈[-428,+148]mm · 9 of 683 slices shown, 12 images]
[im 72/683  mediastinal]
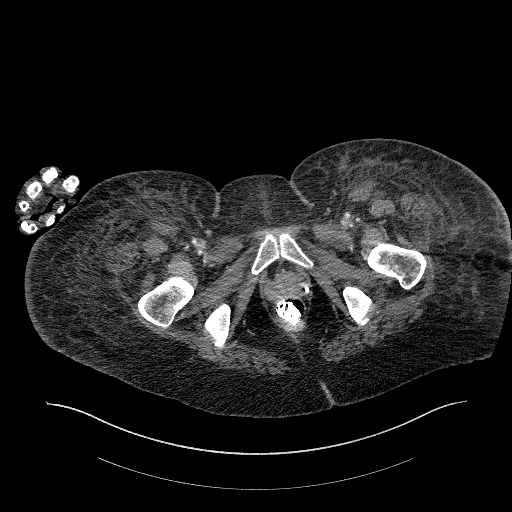
[im 72/683  lung]
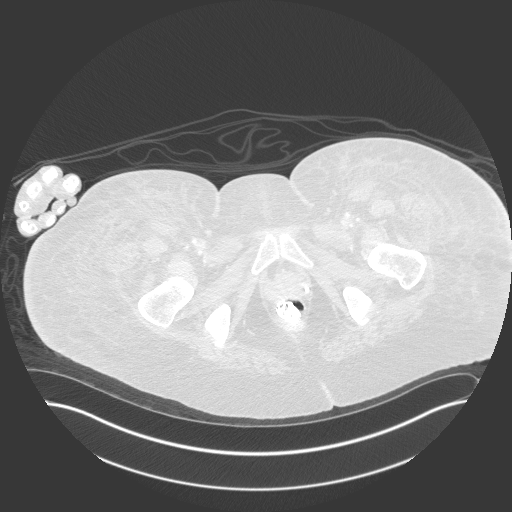
[im 144/683  lung]
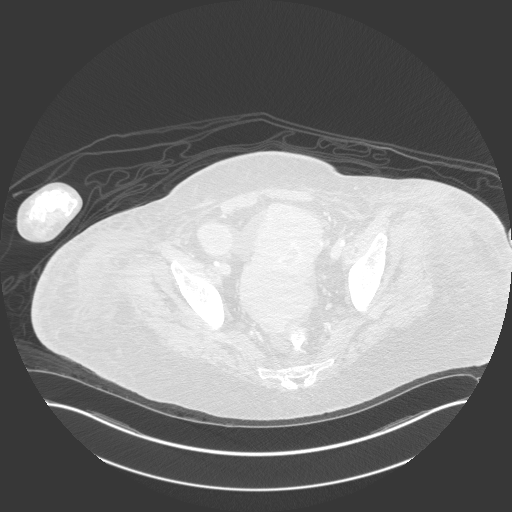
[im 216/683  lung]
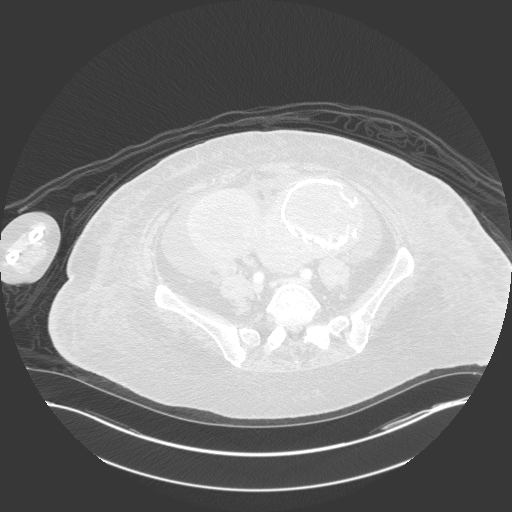
[im 288/683  lung]
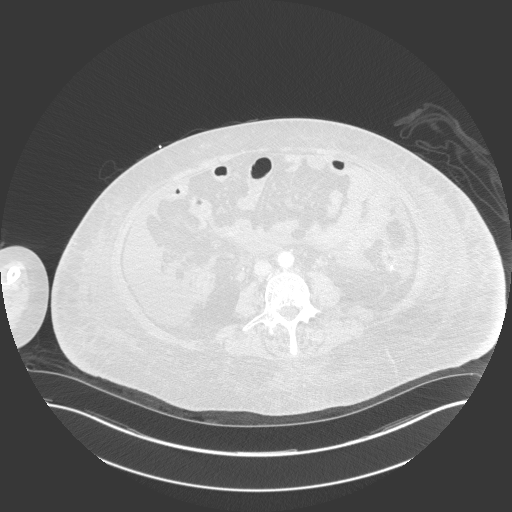
[im 359/683  mediastinal]
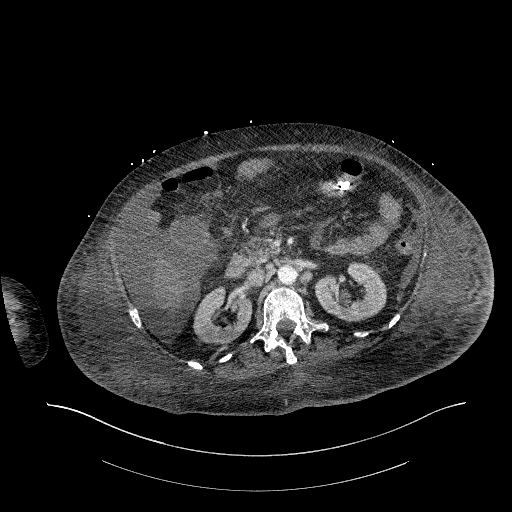
[im 359/683  lung]
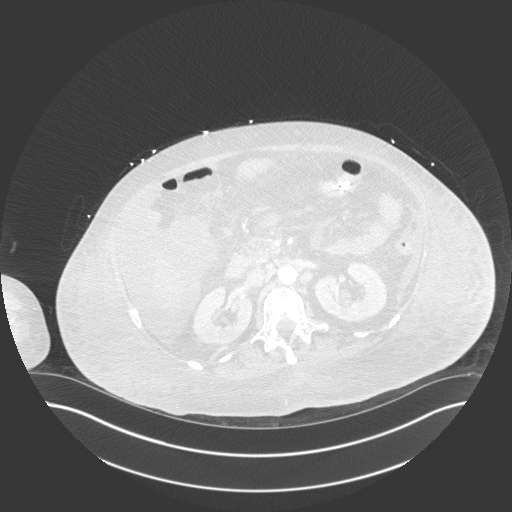
[im 431/683  lung]
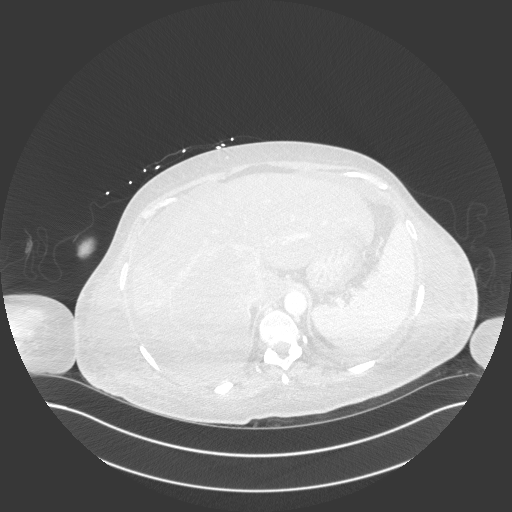
[im 503/683  lung]
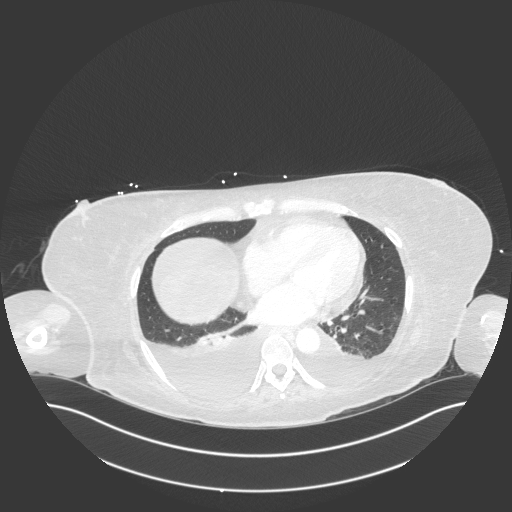
[im 575/683  lung]
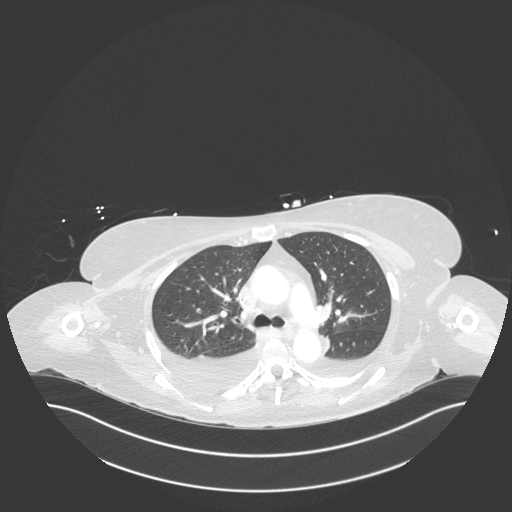
[im 647/683  mediastinal]
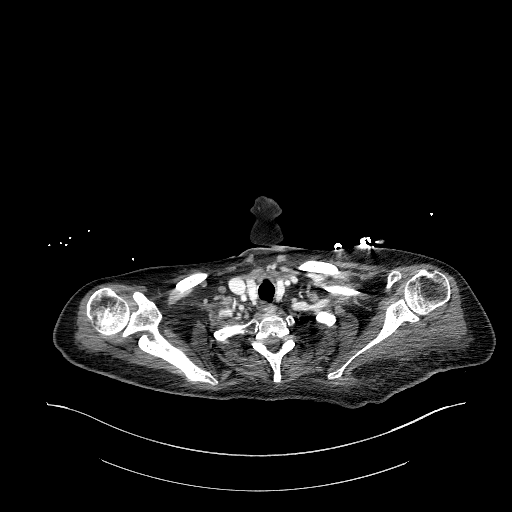
[im 647/683  lung]
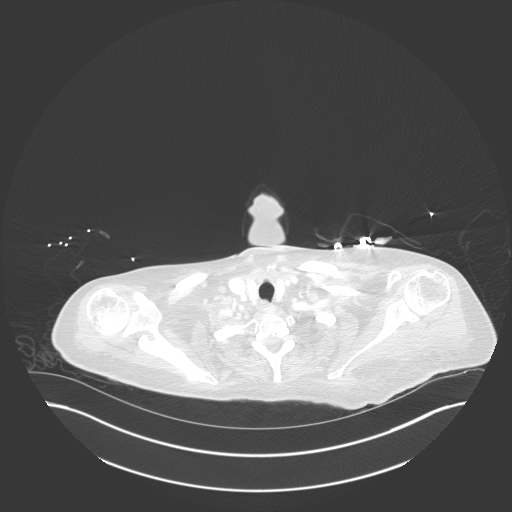

[Series 6: cap with 3mm st cor · coronal · 0.98mm/px · 3 of 190 slices shown]
[im 38/190  lung]
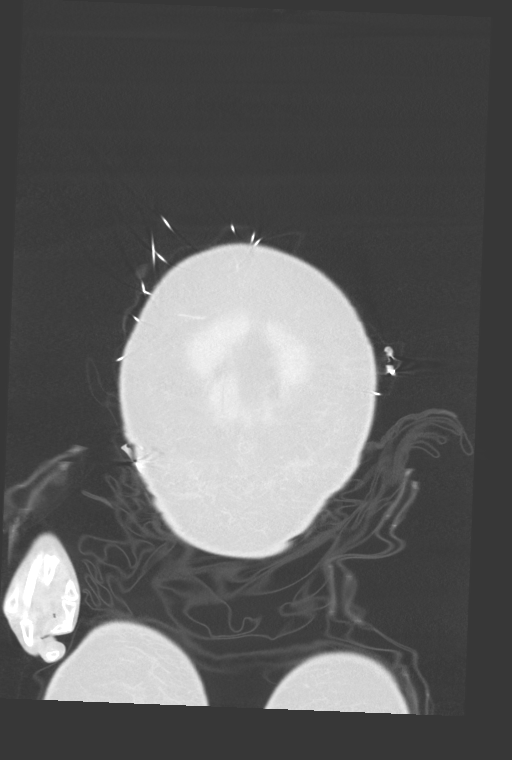
[im 76/190  lung]
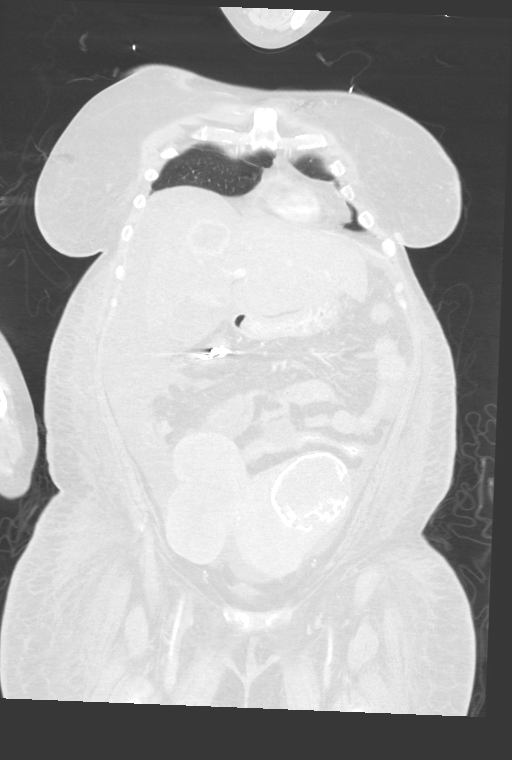
[im 114/190  lung]
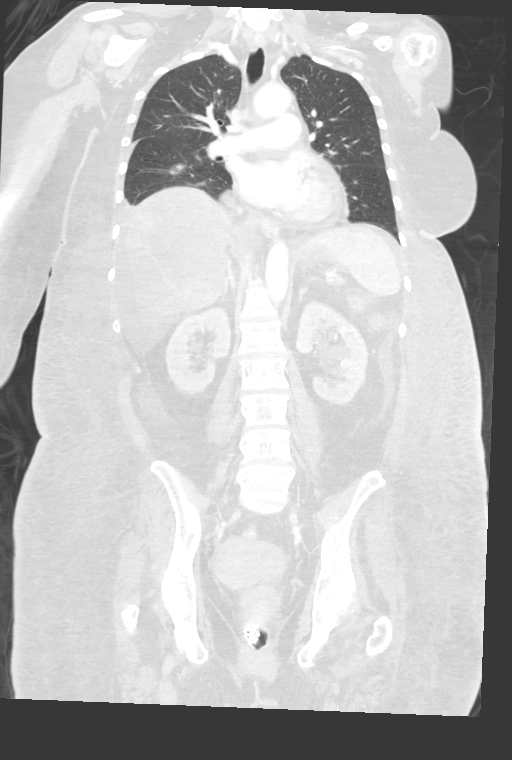

[12 of 36 positions shown; findings below may reference images not displayed]

FINDINGS: Evaluation is limited due to streak artifact caused by patient's
arms.

CT CHEST FINDINGS

Cardiovascular: There is no cardiomegaly. There is a small
pericardial effusion measuring approximately 5 mm in thickness. Mild
atherosclerotic calcification of the aortic arch. No aneurysmal
dilatation or dissection. The origins of the great vessels of the
aortic arch appear patent as visualized. Left-sided PICC with tip in
the upper SVC. The central pulmonary arteries appear unremarkable.

Mediastinum/Nodes: No hilar or mediastinal adenopathy. The esophagus
is grossly unremarkable. No mediastinal fluid collection.

Lungs/Pleura: Moderate right and small left pleural effusions,
significantly increased in size since the CT of 07/30/2019. There is
associated partial compressive atelectasis of the lower lobes.
Pneumonia is not excluded. Multiple bilateral pulmonary nodules
measure up to 6 mm in the left lower lobe consistent with metastatic
disease. There is no pneumothorax. The central airways are patent.

Musculoskeletal: Indeterminate 2 cm lucent lesion in the right
humeral head, possibly a bone cyst. Other etiologies are not
excluded. No acute osseous pathology.

CT ABDOMEN PELVIS FINDINGS

Hepatobiliary: Multiple heterogeneously enhancing liver masses with
the largest mass measuring approximately 12 x 10 cm (49/3). The
largest mass demonstrates predominantly peripheral enhancement with
areas of lower enhancement centrally, likely representing necrotic
tissue. Several additional liver masses noted some of which are new
and others have increased in size since the prior CT. There is
probable background of fatty liver. No intrahepatic biliary ductal
dilatation. No calcified gallstone.

Pancreas: Unremarkable. No pancreatic ductal dilatation or
surrounding inflammatory changes.

Spleen: Normal in size without focal abnormality.

Adrenals/Urinary Tract: The adrenal glands unremarkable. There is no
hydronephrosis on either side. There is symmetric enhancement and
excretion of contrast by both kidneys. Bilateral parapelvic cysts
noted. The visualized ureters and urinary bladder are unremarkable.

Stomach/Bowel: Diffuse thickened appearance of the colon, likely
related to underdistention and ascites. Colitis is less likely but
not excluded clinical correlation is recommended. There is no bowel
obstruction. The appendix is normal.

Vascular/Lymphatic: The abdominal aorta and IVC are unremarkable. No
portal venous gas. Periportal adenopathy measures 13 mm in short
axis (308/4). Multiple rounded and mildly enlarged top-normal
retroperitoneal and para-aortic lymph nodes.

Reproductive: The uterus is enlarged and myomatous. There is
dilatation of the endometrial canal with irregularity of the
endometrium which may represent proteinaceous or blood product or
intracavitary fibroid. Other etiologies are not excluded. This can
be better evaluated with MRI.

Other: Interval development of small ascites, new since the prior
CT. There is diffuse subcutaneous edema and anasarca.

Musculoskeletal: Degenerative changes of the spine. No acute osseous
pathology.
IMPRESSION: 1. Overall progression of disease with increase in the size and
number of the hepatic masses compared to the prior CT.
2. Multiple bilateral pulmonary nodules consistent with metastatic
disease.
3. Interval development of small ascites and anasarca, new since the
prior CT.
4. Bilateral pleural effusions, increased since the prior CT.
5. Diffuse thickened appearance of the colon likely related to
underdistention and ascites. Colitis is less likely but not
excluded. Clinical correlation is recommended. No bowel obstruction.
Normal appendix.
6. Enlarged myomatous uterus. Thickened and irregular appearance of
the endometrial canal may represent proteinaceous or blood product
or intracavitary fibroid. Other etiologies are not excluded. This
can be better evaluated with MRI.
7. Aortic Atherosclerosis ([id number]).
